# Patient Record
Sex: Male | Born: 1940 | Race: White | Hispanic: No | Marital: Married | Smoking: Former smoker
Health system: Southern US, Community
[De-identification: ages and names within clinical notes are randomized; demographics above are authoritative.]

## PROBLEM LIST (undated history)

## (undated) DIAGNOSIS — E785 Hyperlipidemia, unspecified: Secondary | ICD-10-CM

## (undated) DIAGNOSIS — I10 Essential (primary) hypertension: Secondary | ICD-10-CM

## (undated) DIAGNOSIS — M4800 Spinal stenosis, site unspecified: Secondary | ICD-10-CM

## (undated) DIAGNOSIS — G5600 Carpal tunnel syndrome, unspecified upper limb: Secondary | ICD-10-CM

## (undated) DIAGNOSIS — C61 Malignant neoplasm of prostate: Secondary | ICD-10-CM

## (undated) HISTORY — DX: Spinal stenosis, site unspecified: M48.00

## (undated) HISTORY — DX: Carpal tunnel syndrome, unspecified upper limb: G56.00

## (undated) HISTORY — PX: KNEE SURGERY: SHX244

## (undated) HISTORY — PX: INGUINAL HERNIA REPAIR: SUR1180

## (undated) HISTORY — DX: Essential (primary) hypertension: I10

## (undated) HISTORY — DX: Hyperlipidemia, unspecified: E78.5

## (undated) HISTORY — PX: HERNIA REPAIR: SHX51

## (undated) HISTORY — PX: PROSTATE BIOPSY: SHX241

---

## 2000-09-28 ENCOUNTER — Ambulatory Visit (HOSPITAL_COMMUNITY): Admission: RE | Admit: 2000-09-28 | Discharge: 2000-09-28 | Payer: Self-pay | Admitting: Surgery

## 2001-06-17 ENCOUNTER — Encounter: Admission: RE | Admit: 2001-06-17 | Discharge: 2001-06-17 | Payer: Self-pay | Admitting: Internal Medicine

## 2001-06-17 ENCOUNTER — Encounter: Payer: Self-pay | Admitting: Internal Medicine

## 2004-02-19 ENCOUNTER — Ambulatory Visit (HOSPITAL_COMMUNITY): Admission: RE | Admit: 2004-02-19 | Discharge: 2004-02-19 | Payer: Self-pay | Admitting: *Deleted

## 2006-04-10 ENCOUNTER — Encounter: Admission: RE | Admit: 2006-04-10 | Discharge: 2006-04-10 | Payer: Self-pay | Admitting: Internal Medicine

## 2011-01-20 ENCOUNTER — Other Ambulatory Visit: Payer: Self-pay | Admitting: Gastroenterology

## 2011-06-10 ENCOUNTER — Encounter: Payer: Self-pay | Admitting: Podiatry

## 2011-06-10 DIAGNOSIS — B351 Tinea unguium: Secondary | ICD-10-CM | POA: Insufficient documentation

## 2011-06-10 DIAGNOSIS — E119 Type 2 diabetes mellitus without complications: Secondary | ICD-10-CM | POA: Insufficient documentation

## 2011-06-10 DIAGNOSIS — E785 Hyperlipidemia, unspecified: Secondary | ICD-10-CM

## 2011-06-10 DIAGNOSIS — IMO0002 Reserved for concepts with insufficient information to code with codable children: Secondary | ICD-10-CM | POA: Insufficient documentation

## 2011-06-10 DIAGNOSIS — I10 Essential (primary) hypertension: Secondary | ICD-10-CM | POA: Insufficient documentation

## 2018-04-19 ENCOUNTER — Encounter: Payer: Self-pay | Admitting: Radiation Oncology

## 2018-05-03 ENCOUNTER — Encounter: Payer: Self-pay | Admitting: Radiation Oncology

## 2018-05-03 NOTE — Progress Notes (Signed)
GU Location of Tumor / Histology: prostatic adenocarcinoma  If Prostate Cancer, Gleason Score is (3 + 3) and PSA is (5.16). Prostate volume: 32.76 grams.  Scott Wang has been followed by Dr. Junious Silk since 2010 for an elevated PSA.   2010  PSA  2.03 2012  PSA  2.49 09/2015 PSA  3.6 03/2016 PSA  4 05/2016  PSA  4 08/2016 PSA  3.6 02/2017  PSA  4.56 03/29/2018 PSA  5.162   Biopsies of prostate (if applicable) revealed:    Past/Anticipated interventions by urology, if any: prostate biopsy, referral to radiation oncology for consideration of seeds  Past/Anticipated interventions by medical oncology, if any: no  Weight changes, if any: no  Bowel/Bladder complaints, if any: SHIM 9. IPSS 10. Reports ED. Denies dysuria, hematuria, urinary leakage or incontinence.  Nausea/Vomiting, if any: no  Pain issues, if any:  no  SAFETY ISSUES:  Prior radiation? no  Pacemaker/ICD? no  Possible current pregnancy? no  Is the patient on methotrexate? no  Current Complaints / other details:  77 year old male. Married with 2 sons. 3 grandchildren. Enjoys working outside. Retired. Most interested in brachytherapy after the summer.

## 2018-05-05 ENCOUNTER — Ambulatory Visit
Admission: RE | Admit: 2018-05-05 | Discharge: 2018-05-05 | Disposition: A | Discharge status: Home / Self Care | Payer: Medicare Other | Source: Ambulatory Visit | Attending: Radiation Oncology | Admitting: Radiation Oncology

## 2018-05-05 ENCOUNTER — Encounter: Payer: Self-pay | Admitting: Radiation Oncology

## 2018-05-05 ENCOUNTER — Other Ambulatory Visit: Payer: Self-pay

## 2018-05-05 VITALS — BP 144/73 | HR 65 | Temp 98.0°F | Resp 18 | Ht 67.0 in | Wt 173.4 lb

## 2018-05-05 DIAGNOSIS — E785 Hyperlipidemia, unspecified: Secondary | ICD-10-CM | POA: Insufficient documentation

## 2018-05-05 DIAGNOSIS — E119 Type 2 diabetes mellitus without complications: Secondary | ICD-10-CM | POA: Diagnosis not present

## 2018-05-05 DIAGNOSIS — C61 Malignant neoplasm of prostate: Secondary | ICD-10-CM | POA: Diagnosis not present

## 2018-05-05 DIAGNOSIS — Z7982 Long term (current) use of aspirin: Secondary | ICD-10-CM | POA: Diagnosis not present

## 2018-05-05 DIAGNOSIS — Z882 Allergy status to sulfonamides status: Secondary | ICD-10-CM | POA: Insufficient documentation

## 2018-05-05 DIAGNOSIS — Z7984 Long term (current) use of oral hypoglycemic drugs: Secondary | ICD-10-CM | POA: Diagnosis not present

## 2018-05-05 DIAGNOSIS — Z79899 Other long term (current) drug therapy: Secondary | ICD-10-CM | POA: Diagnosis not present

## 2018-05-05 DIAGNOSIS — I10 Essential (primary) hypertension: Secondary | ICD-10-CM | POA: Diagnosis not present

## 2018-05-05 HISTORY — DX: Malignant neoplasm of prostate: C61

## 2018-05-05 NOTE — Progress Notes (Signed)
See progress note under physician encounter. 

## 2018-05-05 NOTE — Progress Notes (Signed)
Radiation Oncology         [phone]) [phone] ________________________________  Initial Outpatient Consultation  Name: [name] MRN: [id number]  Date: 05/05/2018  DOB: 1940-11-28  CC:[name], [name], MD  Scott Aloe, MD   REFERRING PHYSICIAN: [name], MD  DIAGNOSIS: 77 y.o. gentleman with Stage T1c adenocarcinoma of the prostate with Gleason Score of 3+3, and PSA of 5.16.    ICD-10-CM   1. Malignant neoplasm of prostate ([place]) C61     HISTORY OF PRESENT ILLNESS: Scott Wang is a 77 y.o. male with a diagnosis of prostate cancer. He is an established patient of Dr. Junious Silk and has been followed since 2010 for an elevated PSA. His PSA history is documented as: 2010  PSA 2.03 2012  PSA 2.49 09/2015 PSA 3.60 03/2016 PSA 4.00 05/2016 PSA 4.00 08/2016 PSA 3.60 02/2017 PSA 4.56 08/2017 PSA 4.49 02/15/18  PSA 5.16 The patient returned to Dr. Junious Silk on 02/25/18,  digital rectal examination was performed at that time revealing no nodularity.  PSA was 5.16 on 02/15/18, previously 4.49 08/2017. The patient proceeded to transrectal ultrasound with 12 biopsies of the prostate on 03/29/18.  The prostate volume measured 32.76 cc.  Out of 12 core biopsies, two were positive.  The maximum Gleason score was 3+3, and this was seen in left apex lateral and left mid. High grade prostatic intraepithelial neoplasia was noted in the left base, right base lateral, right mid lateral, and right apex lateral.    The patient reviewed the biopsy results with his urologist and he has kindly been referred today for discussion of potential radiation treatment options.   PREVIOUS RADIATION THERAPY: No  PAST MEDICAL HISTORY:  Past Medical History:  Diagnosis Date  . Diabetes mellitus   . Hyperlipidemia   . Hypertension   . Prostate cancer ([place])       PAST SURGICAL HISTORY: Past Surgical History:  Procedure Laterality Date  . HERNIA REPAIR    . HERNIA REPAIR    . INGUINAL HERNIA REPAIR    .  KNEE SURGERY    . KNEE SURGERY    . PROSTATE BIOPSY      FAMILY HISTORY:  Family History  Problem Relation Age of Onset  . Breast cancer Mother   . Heart attack Father   . Prostate cancer Neg Hx   . Pancreatic cancer Neg Hx   . Colon cancer Neg Hx     SOCIAL HISTORY:  Social History   Socioeconomic History  . Marital status: Married    Spouse name: Not on file  . Number of children: Not on file  . Years of education: Not on file  . Highest education level: Not on file  Occupational History  . Occupation: retired  Scientific laboratory technician  . Financial resource strain: Not on file  . Food insecurity:    Worry: Not on file    Inability: Not on file  . Transportation needs:    Medical: Not on file    Non-medical: Not on file  Tobacco Use  . Smoking status: Never Smoker  . Smokeless tobacco: Never Used  Substance and Sexual Activity  . Alcohol use: No  . Drug use: Never  . 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 Medications have been unsuccessful.  Lifestyle  . Physical activity:    Days per week: Not on file    Minutes per session: Not on file  . Stress: Not on file  Relationships  . Social  connections:    Talks on phone: Not on file    Gets together: Not on file    Attends religious service: Not on file    Active member of club or organization: Not on file    Attends meetings of clubs or organizations: Not on file    Relationship status: Not on file  . Intimate partner violence:    Fear of current or ex partner: Not on file    Emotionally abused: Not on file    Physically abused: Not on file    Forced sexual activity: Not on file  Other Topics Concern  . Not on file  Social History Narrative   Resides in [place] with wife. Has two grown sons and three grandchildren.     ALLERGIES: Sulfa antibiotics  MEDICATIONS:  Current Outpatient Medications  Medication Sig Dispense Refill  . aspirin EC 81 MG tablet Take 81 mg by mouth daily.    [name]  atorvastatin (LIPITOR) 10 MG tablet TAKE 1 TABLET BY MOUTH ONCE A DAY    . carvedilol (COREG) 3.125 MG tablet TAKE 1 TABLET BY MOUTH TWICE A DAY WITH FOOD    . Cholecalciferol (VITAMIN D3) 1000 units CAPS Take by mouth.    . DOCOSAHEXAENOIC ACID PO Take 1 g by mouth.    [name] LISINOPRIL PO Take by mouth.      . losartan (COZAAR) 100 MG tablet TAKE 1 TABLET EVERY DAY    . metFORMIN (GLUCOPHAGE) 500 MG tablet TAKE 1 TABLET TWICE A DAY    . Multiple Vitamins-Minerals (MULTIVITAMIN ADULTS PO) Take by mouth.    . Omega-3 Fatty Acids (FISH OIL) 1000 MG CAPS Take by mouth.    Glory Rosebush DELICA LANCETS FINE MISC TEST ONCE DAILY AND IF NEEDED AS DIRECTED BY PHYSICIAN    . sildenafil (REVATIO) 20 MG tablet Take 20 mg by mouth 3 (three) times daily.     No current facility-administered medications for this encounter.     REVIEW OF SYSTEMS:  On review of systems, the patient reports that he is doing well overall. He denies any chest pain, shortness of breath, cough, fevers, chills, night sweats, unintended weight changes. He denies any bowel disturbances, and denies abdominal pain, nausea or vomiting. He denies any new musculoskeletal or joint aches or pains. His IPSS was 10, indicating moderate urinary symptoms. He denies dysuria, hematuria, urinary leakage, or incontinence. He is able to complete sexual activity with about half of attempts. A complete review of systems is obtained and is otherwise negative.    PHYSICAL EXAM:  Wt Readings from Last 3 Encounters:  05/05/18 173 lb 6.4 oz (78.7 kg)   Temp Readings from Last 3 Encounters:  05/05/18 98 F (36.7 C) (Oral)   BP Readings from Last 3 Encounters:  05/05/18 (!) 144/73   Pulse Readings from Last 3 Encounters:  05/05/18 65   Pain Assessment Pain Score: 0-No pain/10  In general this is a well appearing caucasian gentleman in no acute distress. He is alert and oriented x4 and appropriate throughout the examination. HEENT reveals that the  patient is normocephalic, atraumatic. EOMs are intact. PERRLA. Skin is intact without any evidence of gross lesions. Cardiovascular exam reveals a regular rate and rhythm, no clicks rubs or murmurs are auscultated. Chest is clear to auscultation bilaterally. Lymphatic assessment is performed and does not reveal any adenopathy in the cervical, supraclavicular, axillary, or inguinal chains. Abdomen has active bowel sounds in all quadrants and is intact. The abdomen  is soft, non tender, non distended. Lower extremities are negative for pretibial pitting edema, deep calf tenderness, cyanosis or clubbing.   KPS = 100  100 - Normal; no complaints; no evidence of disease. 90   - Able to carry on normal activity; minor signs or symptoms of disease. 80   - Normal activity with effort; some signs or symptoms of disease. 33   - Cares for self; unable to carry on normal activity or to do active work. 60   - Requires occasional assistance, but is able to care for most of his personal needs. 50   - Requires considerable assistance and frequent medical care. 32   - Disabled; requires special care and assistance. 8   - Severely disabled; hospital admission is indicated although death not imminent. 10   - Very sick; hospital admission necessary; active supportive treatment necessary. 10   - Moribund; fatal processes progressing rapidly. 0     - Dead  Karnofsky DA, Abelmann WH, Craver LS and Burchenal JH [phone]) The use of the nitrogen mustards in the palliative treatment of carcinoma: with particular reference to bronchogenic carcinoma Cancer 1 634-56  LABORATORY DATA:  No results found for: WBC, HGB, HCT, MCV, PLT No results found for: NA, K, CL, CO2 No results found for: ALT, AST, GGT, ALKPHOS, BILITOT   RADIOGRAPHY: No results found.    IMPRESSION/PLAN: 1. 77 y.o. gentleman with Stage T1c adenocarcinoma of the prostate with Gleason Score of 3+3, and PSA of 5.16. We discussed the patient's workup and  outlined the nature of prostate cancer in this setting. The patient's T stage, Gleason's score, and PSA put him into the low risk group. Accordingly, he is eligible for a variety of potential treatment options including active surveillance, prostatectomy, brachytherapy or 5.5 weeks of external radiation.  We discussed the available radiation techniques, and focused on the details and logistics and delivery. We discussed and outlined the risks, benefits, short and long-term effects associated with radiotherapy and compared and contrasted these with prostatectomy. We also discussed the role of SpaceOAR in reducing the rectal toxicity associated with radiotherapy.  At the conclusion of our conversation, the patient is interested in moving forward with brachytherapy and use of SpaceOAR to reduce rectal toxicity from radiotherapy.  We will share our discussion with Dr. Junious Silk and move forward with scheduling his CT [facility] planning appointment in the near future.  The patient met briefly with Romie Jumper in our office who will be working closely with him to coordinate OR scheduling and pre and post procedure appointments.  We will contact the pharmaceutical rep to ensure that [place] is available at the time of procedure.  He will have a prostate MRI following his post-seed CT SIM to confirm appropriate distribution of the [place].  We enjoyed meeting with him and his wife today, and will look forward to participating in the care of this very nice gentleman.  We spent 60 minutes minutes face to face with the patient and more than 50% of that time was spent in counseling and/or coordination of care.    Nicholos Johns, PA-C    Tyler Pita, MD  [facility] Direct Dial: [phone]  Fax: [phone] [email or web address]  Skype  [email or web address]    This document serves as a record of services personally performed by Tyler Pita, MD and Freeman Caldron, PA-C. It was created on their  behalf by Bethann Humble, a trained medical scribe. The creation of this record is based on  the scribe's personal observations and the provider's statements to them. This document has been checked and approved by the attending provider.

## 2018-05-10 ENCOUNTER — Telehealth: Payer: Self-pay | Admitting: *Deleted

## 2018-05-10 NOTE — Telephone Encounter (Signed)
Called patient to inform of pre-seed appts. On 07-08-18, lvm for a return call

## 2018-05-14 ENCOUNTER — Ambulatory Visit: Payer: Medicare Other | Admitting: Radiation Oncology

## 2018-05-20 ENCOUNTER — Telehealth: Payer: Self-pay | Admitting: Medical Oncology

## 2018-05-20 NOTE — Telephone Encounter (Signed)
Left message with Scott Wang to introduce myself as the prostate nurse navigator and my role. I was unable to meet him the day he consulted with Dr. Tammi Klippel. I informed him that [name] had tried to contact him with appointment for pre-seed CT appointment 8/29 at 1:00pm. I asked him to call back to confirm.

## 2018-06-16 ENCOUNTER — Other Ambulatory Visit: Payer: Self-pay | Admitting: Urology

## 2018-07-07 ENCOUNTER — Telehealth: Payer: Self-pay | Admitting: *Deleted

## 2018-07-07 NOTE — Telephone Encounter (Signed)
Called patient to remind of pre-seed appts. for 07-08-18, lvm for a return call

## 2018-07-08 ENCOUNTER — Ambulatory Visit: Payer: Medicare Other | Admitting: Radiation Oncology

## 2018-07-08 ENCOUNTER — Ambulatory Visit
Admission: RE | Admit: 2018-07-08 | Discharge: 2018-07-08 | Disposition: A | Discharge status: Home / Self Care | Payer: Medicare Other | Source: Ambulatory Visit | Attending: Radiation Oncology | Admitting: Radiation Oncology

## 2018-07-08 ENCOUNTER — Encounter: Payer: Self-pay | Admitting: Medical Oncology

## 2018-07-08 DIAGNOSIS — C61 Malignant neoplasm of prostate: Secondary | ICD-10-CM

## 2018-07-08 NOTE — Progress Notes (Signed)
  Radiation Oncology         [phone] ________________________________  Name: CHRISTOPHE RISING MRN: [id number]  Date: 07/08/2018  DOB: [birth year]  SIMULATION AND TREATMENT PLANNING NOTE PUBIC ARCH STUDY  CC:[name], [name], MD  [name], MD  DIAGNOSIS: 77 y.o. gentleman with Stage T1c adenocarcinoma of the prostate with Gleason Score of 3+3, and PSA of 5.16.     ICD-10-CM   1. Malignant neoplasm of prostate ([place]) C61     COMPLEX SIMULATION:  The patient presented today for evaluation for possible prostate seed implant. He was brought to the radiation planning suite and placed supine on the CT couch. A 3-dimensional image study set was obtained in upload to the planning computer. There, on each axial slice, I contoured the prostate gland. Then, using three-dimensional radiation planning tools I reconstructed the prostate in view of the structures from the transperineal needle pathway to assess for possible pubic arch interference. In doing so, I did not appreciate any pubic arch interference. Also, the patient's prostate volume was estimated based on the drawn structure. The volume was 33 cc.  Given the pubic arch appearance and prostate volume, patient remains a good candidate to proceed with prostate seed implant. Today, he freely provided informed written consent to proceed.    PLAN: The patient will undergo prostate seed implant.   ________________________________  Sheral Apley. Tammi Klippel, M.D.

## 2018-07-09 ENCOUNTER — Ambulatory Visit (HOSPITAL_COMMUNITY)
Admission: RE | Admit: 2018-07-09 | Discharge: 2018-07-09 | Disposition: A | Discharge status: Home / Self Care | Payer: Medicare Other | Source: Ambulatory Visit | Attending: Urology | Admitting: Urology

## 2018-07-09 ENCOUNTER — Encounter (HOSPITAL_COMMUNITY)
Admission: RE | Admit: 2018-07-09 | Discharge: 2018-07-09 | Disposition: A | Discharge status: Home / Self Care | Payer: Medicare Other | Source: Ambulatory Visit | Attending: Urology | Admitting: Urology

## 2018-07-09 DIAGNOSIS — C61 Malignant neoplasm of prostate: Secondary | ICD-10-CM

## 2018-07-09 DIAGNOSIS — Z01810 Encounter for preprocedural cardiovascular examination: Secondary | ICD-10-CM | POA: Insufficient documentation

## 2018-07-09 DIAGNOSIS — J984 Other disorders of lung: Secondary | ICD-10-CM | POA: Diagnosis not present

## 2018-07-09 DIAGNOSIS — Z01818 Encounter for other preprocedural examination: Secondary | ICD-10-CM | POA: Diagnosis present

## 2018-08-17 ENCOUNTER — Other Ambulatory Visit: Payer: Self-pay | Admitting: Urology

## 2018-08-17 DIAGNOSIS — C61 Malignant neoplasm of prostate: Secondary | ICD-10-CM

## 2018-08-30 ENCOUNTER — Telehealth: Payer: Self-pay | Admitting: *Deleted

## 2018-08-30 NOTE — Telephone Encounter (Signed)
CALLED PATIENT TO REMIND OF LABS FOR 08-31-18 - ARRIVAL TIME - 10:45 AM @ [facility] ADMITTING, LVM FOR A RETURN CALL

## 2018-08-31 ENCOUNTER — Encounter (HOSPITAL_COMMUNITY)
Admission: RE | Admit: 2018-08-31 | Discharge: 2018-08-31 | Disposition: A | Discharge status: Home / Self Care | Payer: Medicare Other | Source: Ambulatory Visit | Attending: Urology | Admitting: Urology

## 2018-08-31 DIAGNOSIS — Z01812 Encounter for preprocedural laboratory examination: Secondary | ICD-10-CM | POA: Insufficient documentation

## 2018-08-31 LAB — COMPREHENSIVE METABOLIC PANEL
ALK PHOS: 30 U/L — AB (ref 38–126)
ALT: 23 U/L (ref 0–44)
AST: 21 U/L (ref 15–41)
Albumin: 4.1 g/dL (ref 3.5–5.0)
Anion gap: 8 (ref 5–15)
BUN: 23 mg/dL (ref 8–23)
CALCIUM: 10.2 mg/dL (ref 8.9–10.3)
CHLORIDE: 106 mmol/L (ref 98–111)
CO2: 23 mmol/L (ref 22–32)
CREATININE: 1.24 mg/dL (ref 0.61–1.24)
GFR calc Af Amer: >60 mL/min (ref >60)
GFR, EST NON AFRICAN AMERICAN: 55 mL/min — AB (ref >60)
Glucose, Bld: 107 mg/dL — ABNORMAL HIGH (ref 70–99)
Potassium: 4.2 mmol/L (ref 3.5–5.1)
Sodium: 137 mmol/L (ref 135–145)
Total Bilirubin: 1.2 mg/dL (ref 0.3–1.2)
Total Protein: 7.3 g/dL (ref 6.5–8.1)

## 2018-08-31 LAB — CBC
HCT: 39.9 % (ref 39.0–52.0)
HEMOGLOBIN: 13 g/dL (ref 13.0–17.0)
MCH: 30.5 pg (ref 26.0–34.0)
MCHC: 32.6 g/dL (ref 30.0–36.0)
MCV: 93.7 fL (ref 80.0–100.0)
NRBC: 0 % (ref 0.0–0.2)
PLATELETS: 205 10*3/uL (ref 150–400)
RBC: 4.26 MIL/uL (ref 4.22–5.81)
RDW: 13.2 % (ref 11.5–15.5)
WBC: 7.2 10*3/uL (ref 4.0–10.5)

## 2018-08-31 LAB — PROTIME-INR
INR: 0.87
PROTHROMBIN TIME: 11.8 s (ref 11.4–15.2)

## 2018-08-31 LAB — APTT: aPTT: 27 seconds (ref 24–36)

## 2018-09-02 ENCOUNTER — Encounter (HOSPITAL_BASED_OUTPATIENT_CLINIC_OR_DEPARTMENT_OTHER): Payer: Self-pay | Admitting: *Deleted

## 2018-09-02 ENCOUNTER — Other Ambulatory Visit: Payer: Self-pay

## 2018-09-02 NOTE — Progress Notes (Signed)
Spoke with patient via telephone for pre op interview. NPO after MN. Patient to take coreg AM of surgery with a sip of water. EKG and Labs in chart and Epic. Arrival time 71. Patient to do fleets enema AM of surgery.

## 2018-09-06 ENCOUNTER — Telehealth: Payer: Self-pay | Admitting: *Deleted

## 2018-09-06 NOTE — Telephone Encounter (Signed)
CALLED PATIENT TO REMIND OF PROCEDURE FOR 09-07-18, LVM FOR A RETURN CALL

## 2018-09-06 NOTE — H&P (Signed)
Office Visit Report     08/18/2018   --------------------------------------------------------------------------------   Scott Wang  MRN: [id number]  PRIMARY CARE:  [facility]), MD  DOB: 10-29-1941, 77 year old Male  REFERRING:  [facility]), MD  SSN: -[phone]  PROVIDER:  [name], M.D.    TREATING:  Jiles Crocker    LOCATION:  [facility] [phone]   --------------------------------------------------------------------------------   CC: I have prostate cancer.  HPI: Scott Wang is a 77 year-old male established patient who is here evaluation for treatment of prostate cancer.  May 2019: low risk prostate cancer  PSA 5.16  T1c  Prostate 33 g  Gleason 3+3 = 6, left mid, 20%  Gleason 3+3 = 6, left apex, 20%  Atypia left apex 1  HGPIN x 4   AUASS = 6. SHIM given, not done. He uses sildenafil 100 mg.   He returns and PSA remains stable at 4.9. He is planning for brachytherapy Oct 2019. No gross hematuria or dysuria.    08/18/18: Here today for pre-operative appointment prior to undergoing brachytherapy seed placement with SpaceOAR on 10/29. He denies any changes in voiding habits, recent burning or painful urination, gross hematuria. He has not had any changes in medical history or medications. No recent fevers or other constitutional signs or symptoms of infection.   Symptom score sheet remains 6. He is not on any medication to assist with lower urinary tract symptoms.   His prostate cancer was diagnosed 03/29/2018. His PSA at his time of diagnosis was 5.16.     ALLERGIES: Sulfa Drugs    MEDICATIONS: Levaquin 750 mg tablet take 1 tablet 1 hour prior to procedure  Aspirin 81 MG TABS Oral  Atorvastatin Calcium 10 mg tablet 1 tablet PO Daily  Carvedilol 3.125 mg tablet 1 tablet PO Daily  Fish Oil CAPS Oral  Losartan Potassium 50 mg tablet Oral  Magnesium TABS Oral  Metformin Hcl 500 mg tablet Oral   Multivitamin 1 PO Daily  Omega-3 Krill Oil 1 PO Daily  Sildenafil 20 mg tablet 1-5 tablets po as needed  Vitamin D3 CAPS Oral     GU PSH: Prostate Needle Biopsy - 03/29/2018    NON-GU PSH: Inguinal hernia repair (laparoscopic) Knee Arthroscopy; Dx - 2010, 2010 Surgical Pathology, Gross And Microscopic Examination For Prostate Needle - 03/29/2018    GU PMH: ED due to arterial insufficiency - 06/30/2018, - 02/25/2018, - 08/24/2017, - 02/19/2017, - 09/04/2016, - 2017, - 2017 Prostate Cancer - 06/30/2018, - 04/15/2018 Elevated PSA - 03/29/2018, - 02/25/2018, - 08/24/2017, - 02/19/2017, - 09/04/2016, - 2017 Bladder-neck stenosis/contracture, Bladder neck contracture - 2014 BPH w/o LUTS, Benign prostatic hypertrophy without lower urinary tract symptoms - 2014 Inflammatory Disease Prostate, Unspec, Prostatitis - 2014      PMH Notes:  2008-12-13 10:05:20 - Note: Glucose Intolerance   NON-GU PMH: Diabetes Type 2 Encounter for general adult medical examination without abnormal findings, Encounter for preventive health examination Hypercholesterolemia Hypertension    FAMILY HISTORY: Acute Myocardial Infarction - Father Death In The Family Father - Father Family Health Status - Mother's Age - Mother Family Health Status Number - Runs In Family   SOCIAL HISTORY: Marital Status: Married Preferred Language: English; Ethnicity: Not Hispanic Or Latino; Race: White Patient's occupation is/was retired.     Notes: 2 sons   REVIEW OF SYSTEMS:    GU Review Male:   Patient reports erection problems. Patient denies frequent urination, hard to  postpone urination, burning/ pain with urination, get up at night to urinate, leakage of urine, stream starts and stops, trouble starting your stream, have to strain to urinate , and penile pain.  Gastrointestinal (Upper):   Patient denies nausea, vomiting, and indigestion/ heartburn.  Gastrointestinal (Lower):   Patient denies diarrhea and constipation.   Constitutional:   Patient denies fever, night sweats, weight loss, and fatigue.  Skin:   Patient denies skin rash/ lesion and itching.  Eyes:   Patient denies blurred vision and double vision.  Ears/ Nose/ Throat:   Patient denies sore throat and sinus problems.  Hematologic/Lymphatic:   Patient denies swollen glands and easy bruising.  Cardiovascular:   Patient denies leg swelling and chest pains.  Respiratory:   Patient denies cough and shortness of breath.  Endocrine:   Patient denies excessive thirst.  Musculoskeletal:   Patient denies back pain and joint pain.  Neurological:   Patient denies headaches and dizziness.  Psychologic:   Patient denies depression and anxiety.   Notes: Reviewed previous review of systems 02/25/2018. No changes.   VITAL SIGNS:      08/18/2018 09:26 AM  Weight 172 lb / 78.02 kg  Height 68 in / 172.72 cm  BP 168/85 mmHg  Pulse 67 /min  Temperature 97.9 F / 36.6 C  BMI 26.1 kg/m   MULTI-SYSTEM PHYSICAL EXAMINATION:    Constitutional: Well-nourished. No physical deformities. Normally developed. Good grooming.  Neck: Neck symmetrical, not swollen. Normal tracheal position.  Respiratory: No labored breathing, no use of accessory muscles.   Cardiovascular: Normal temperature, normal extremity pulses, no swelling, no varicosities.  Skin: No paleness, no jaundice, no cyanosis. No lesion, no ulcer, no rash.  Neurologic / Psychiatric: Oriented to time, oriented to place, oriented to person. No depression, no anxiety, no agitation.  Gastrointestinal: No mass, no tenderness, no rigidity, non obese abdomen.  Musculoskeletal: Normal gait and station of head and neck.     PAST DATA REVIEWED:  Source Of History:  Patient  Lab Test Review:   PSA  Records Review:   Pathology Reports, Previous Patient Records  Urine Test Review:   Urinalysis   06/24/18 02/15/18 08/20/17 05/28/17 02/13/17 08/27/16  PSA  Total PSA 4.90 ng/mL 5.16 ng/mL 4.49 ng/mL 4.46 ng/mL 4.56  ng/dl 3.63 ng/dl  Free PSA  0.73 ng/mL 0.53 ng/mL 0.53 ng/mL 0.46 ng/dl   % Free PSA  14 % PSA 12 % PSA 12 % PSA 10 %     08/18/18  Urinalysis  Urine Appearance Clear   Urine Color Yellow   Urine Glucose Neg mg/dL  Urine Bilirubin Neg mg/dL  Urine Ketones Neg mg/dL  Urine Specific Gravity 1.025   Urine Blood Neg ery/uL  Urine pH 6.0   Urine Protein Neg mg/dL  Urine Urobilinogen 0.2 mg/dL  Urine Nitrites Neg   Urine Leukocyte Esterase Neg leu/uL   PROCEDURES:          Urinalysis Dipstick Dipstick Cont'd  Color: Yellow Bilirubin: Neg mg/dL  Appearance: Clear Ketones: Neg mg/dL  Specific Gravity: 1.025 Blood: Neg ery/uL  pH: 6.0 Protein: Neg mg/dL  Glucose: Neg mg/dL Urobilinogen: 0.2 mg/dL    Nitrites: Neg    Leukocyte Esterase: Neg leu/uL    ASSESSMENT:      ICD-10 Details  1 GU:   Prostate Cancer - C61    PLAN:           Orders Labs Urine Culture          Schedule  Return Visit/Planned Activity: Keep Scheduled Appointment - Schedule Surgery          Document Letter(s):  Created for Patient: Clinical Summary         Notes:   I discussed expected postprocedural course in terms of lower urinary tract symptoms, bowel management, activity limitations, scheduled follow-up etc. I answered all questions to the best of my ability with understanding expressed by the patient. A preprocedural urine culture was sent today. He will keep scheduled appointment for brachytherapy seed placement with SpaceOAR on 10/29.        Next Appointment:      Next Appointment: 09/07/2018 08:30 AM    Appointment Type: Surgery     Location: [facility] [phone]    Provider: [name], M.D.    Reason for Visit: NE/OP RAD SEED AND SPACE OAR      * Signed by Jiles Crocker on 08/18/18 at 10:20 AM (EDT)*     The information contained in this medical record document is considered private and confidential patient information. This information can only be used for  the medical diagnosis and/or medical services that are being provided by the patient's selected caregivers. This information can only be distributed outside of the patient's care if the patient agrees and signs waivers of authorization for this information to be sent to an outside source or route.

## 2018-09-07 ENCOUNTER — Encounter (HOSPITAL_BASED_OUTPATIENT_CLINIC_OR_DEPARTMENT_OTHER)
Admission: RE | Disposition: A | Discharge status: Home / Self Care | Payer: Self-pay | Source: Ambulatory Visit | Attending: Urology

## 2018-09-07 ENCOUNTER — Ambulatory Visit (HOSPITAL_COMMUNITY): Payer: Medicare Other

## 2018-09-07 ENCOUNTER — Ambulatory Visit (HOSPITAL_BASED_OUTPATIENT_CLINIC_OR_DEPARTMENT_OTHER): Payer: Medicare Other | Admitting: Certified Registered"

## 2018-09-07 ENCOUNTER — Ambulatory Visit (HOSPITAL_BASED_OUTPATIENT_CLINIC_OR_DEPARTMENT_OTHER)
Admission: RE | Admit: 2018-09-07 | Discharge: 2018-09-07 | Disposition: A | Discharge status: Home / Self Care | Payer: Medicare Other | Source: Ambulatory Visit | Attending: Urology | Admitting: Urology

## 2018-09-07 ENCOUNTER — Encounter (HOSPITAL_BASED_OUTPATIENT_CLINIC_OR_DEPARTMENT_OTHER): Payer: Self-pay

## 2018-09-07 ENCOUNTER — Other Ambulatory Visit: Payer: Self-pay

## 2018-09-07 DIAGNOSIS — E119 Type 2 diabetes mellitus without complications: Secondary | ICD-10-CM | POA: Insufficient documentation

## 2018-09-07 DIAGNOSIS — Z7984 Long term (current) use of oral hypoglycemic drugs: Secondary | ICD-10-CM | POA: Insufficient documentation

## 2018-09-07 DIAGNOSIS — C61 Malignant neoplasm of prostate: Secondary | ICD-10-CM | POA: Insufficient documentation

## 2018-09-07 DIAGNOSIS — Z7982 Long term (current) use of aspirin: Secondary | ICD-10-CM | POA: Insufficient documentation

## 2018-09-07 DIAGNOSIS — Z8249 Family history of ischemic heart disease and other diseases of the circulatory system: Secondary | ICD-10-CM | POA: Diagnosis not present

## 2018-09-07 DIAGNOSIS — E7800 Pure hypercholesterolemia, unspecified: Secondary | ICD-10-CM | POA: Insufficient documentation

## 2018-09-07 DIAGNOSIS — I10 Essential (primary) hypertension: Secondary | ICD-10-CM | POA: Diagnosis not present

## 2018-09-07 DIAGNOSIS — Z882 Allergy status to sulfonamides status: Secondary | ICD-10-CM | POA: Diagnosis not present

## 2018-09-07 DIAGNOSIS — N35919 Unspecified urethral stricture, male, unspecified site: Secondary | ICD-10-CM | POA: Insufficient documentation

## 2018-09-07 DIAGNOSIS — Z79899 Other long term (current) drug therapy: Secondary | ICD-10-CM | POA: Insufficient documentation

## 2018-09-07 HISTORY — PX: RADIOACTIVE SEED IMPLANT: SHX5150

## 2018-09-07 HISTORY — PX: SPACE OAR INSTILLATION: SHX6769

## 2018-09-07 LAB — GLUCOSE, CAPILLARY
GLUCOSE-CAPILLARY: 111 mg/dL — AB (ref 70–99)
Glucose-Capillary: 113 mg/dL — ABNORMAL HIGH (ref 70–99)

## 2018-09-07 SURGERY — INSERTION, RADIATION SOURCE, PROSTATE
Anesthesia: General

## 2018-09-07 MED ORDER — SODIUM CHLORIDE 0.9 % IV SOLN
INTRAVENOUS | Status: DC | PRN
  Administered 2018-09-07: 15 ug/min via INTRAVENOUS

## 2018-09-07 MED ORDER — OXYCODONE HCL 5 MG/5ML PO SOLN
5.0000 mg | Freq: Once | ORAL | Status: DC | PRN
  Filled 2018-09-07: qty 5

## 2018-09-07 MED ORDER — CEPHALEXIN 500 MG PO CAPS: 500.0000 mg | ORAL_CAPSULE | Freq: Two times a day (BID) | ORAL | 0 refills | Status: AC

## 2018-09-07 MED ORDER — FENTANYL CITRATE (PF) 250 MCG/5ML IJ SOLN: INTRAMUSCULAR | Status: DC | PRN

## 2018-09-07 MED ORDER — LIDOCAINE 2% (20 MG/ML) 5 ML SYRINGE
INTRAMUSCULAR | Status: AC
  Filled 2018-09-07: qty 5

## 2018-09-07 MED ORDER — SODIUM CHLORIDE 0.9 % IJ SOLN
INTRAMUSCULAR | Status: DC | PRN
  Administered 2018-09-07: 10 mL

## 2018-09-07 MED ORDER — FENTANYL CITRATE (PF) 100 MCG/2ML IJ SOLN
INTRAMUSCULAR | Status: AC
  Filled 2018-09-07: qty 2

## 2018-09-07 MED ORDER — ALFUZOSIN HCL ER 10 MG PO TB24: 10.0000 mg | ORAL_TABLET | Freq: Every day | ORAL | 1 refills | Status: DC

## 2018-09-07 MED ORDER — EPHEDRINE 5 MG/ML INJ
INTRAVENOUS | Status: AC
  Filled 2018-09-07: qty 10

## 2018-09-07 MED ORDER — CIPROFLOXACIN IN D5W 400 MG/200ML IV SOLN
400.0000 mg | INTRAVENOUS | Status: AC
  Administered 2018-09-07: 400 mg via INTRAVENOUS
  Filled 2018-09-07: qty 200

## 2018-09-07 MED ORDER — FENTANYL CITRATE (PF) 100 MCG/2ML IJ SOLN
25.0000 ug | INTRAMUSCULAR | Status: DC | PRN
  Filled 2018-09-07: qty 1

## 2018-09-07 MED ORDER — DEXAMETHASONE SODIUM PHOSPHATE 10 MG/ML IJ SOLN
INTRAMUSCULAR | Status: DC | PRN
  Administered 2018-09-07: 4 mg via INTRAVENOUS

## 2018-09-07 MED ORDER — FLEET ENEMA 7-19 GM/118ML RE ENEM
1.0000 | ENEMA | Freq: Once | RECTAL | Status: DC
  Filled 2018-09-07: qty 1

## 2018-09-07 MED ORDER — PHENYLEPHRINE HCL 10 MG/ML IJ SOLN
INTRAMUSCULAR | Status: AC
  Filled 2018-09-07: qty 1

## 2018-09-07 MED ORDER — DEXAMETHASONE SODIUM PHOSPHATE 10 MG/ML IJ SOLN
INTRAMUSCULAR | Status: AC
  Filled 2018-09-07: qty 1

## 2018-09-07 MED ORDER — EPHEDRINE SULFATE-NACL 50-0.9 MG/10ML-% IV SOSY
PREFILLED_SYRINGE | INTRAVENOUS | Status: DC | PRN
  Administered 2018-09-07: 10 mg via INTRAVENOUS

## 2018-09-07 MED ORDER — ONDANSETRON HCL 4 MG/2ML IJ SOLN
INTRAMUSCULAR | Status: AC
  Filled 2018-09-07: qty 2

## 2018-09-07 MED ORDER — LACTATED RINGERS IV SOLN
INTRAVENOUS | Status: DC
  Administered 2018-09-07: 12:00:00 via INTRAVENOUS
  Administered 2018-09-07: 1000 mL via INTRAVENOUS
  Filled 2018-09-07: qty 1000

## 2018-09-07 MED ORDER — CIPROFLOXACIN IN D5W 400 MG/200ML IV SOLN
INTRAVENOUS | Status: AC
  Filled 2018-09-07: qty 200

## 2018-09-07 MED ORDER — KETOROLAC TROMETHAMINE 30 MG/ML IJ SOLN
INTRAMUSCULAR | Status: DC | PRN
  Administered 2018-09-07: 30 mg via INTRAVENOUS

## 2018-09-07 MED ORDER — LIDOCAINE 2% (20 MG/ML) 5 ML SYRINGE
INTRAMUSCULAR | Status: DC | PRN
  Administered 2018-09-07: 60 mg via INTRAVENOUS

## 2018-09-07 MED ORDER — PROPOFOL 10 MG/ML IV BOLUS
INTRAVENOUS | Status: DC | PRN
  Administered 2018-09-07: 120 mg via INTRAVENOUS

## 2018-09-07 MED ORDER — OXYCODONE HCL 5 MG PO TABS
5.0000 mg | ORAL_TABLET | Freq: Once | ORAL | Status: DC | PRN
  Filled 2018-09-07: qty 1

## 2018-09-07 MED ORDER — PROPOFOL 10 MG/ML IV BOLUS
INTRAVENOUS | Status: AC
  Filled 2018-09-07: qty 20

## 2018-09-07 MED ORDER — SODIUM CHLORIDE 0.9 % IV SOLN
INTRAVENOUS | Status: AC | PRN
  Administered 2018-09-07: 1000 mL

## 2018-09-07 MED ORDER — FENTANYL CITRATE (PF) 100 MCG/2ML IJ SOLN
INTRAMUSCULAR | Status: DC | PRN
  Administered 2018-09-07 (×4): 50 ug via INTRAVENOUS

## 2018-09-07 MED ORDER — IOHEXOL 300 MG/ML  SOLN
INTRAMUSCULAR | Status: DC | PRN
  Administered 2018-09-07: 7 mL

## 2018-09-07 MED ORDER — ONDANSETRON HCL 4 MG/2ML IJ SOLN
INTRAMUSCULAR | Status: DC | PRN
  Administered 2018-09-07: 4 mg via INTRAVENOUS

## 2018-09-07 MED ORDER — ONDANSETRON HCL 4 MG/2ML IJ SOLN
4.0000 mg | Freq: Once | INTRAMUSCULAR | Status: DC | PRN
  Filled 2018-09-07: qty 2

## 2018-09-07 SURGICAL SUPPLY — 49 items
BAG URINE DRAINAGE (UROLOGICAL SUPPLIES) ×3 IMPLANT
BLADE CLIPPER SURG (BLADE) ×3 IMPLANT
CATH FOLEY 2W COUNCIL 5CC 18FR (CATHETERS) ×3 IMPLANT
CATH FOLEY 2WAY SLVR  5CC 16FR (CATHETERS) ×2
CATH FOLEY 2WAY SLVR 5CC 16FR (CATHETERS) ×1 IMPLANT
CATH ROBINSON RED A/P 16FR (CATHETERS) IMPLANT
CATH ROBINSON RED A/P 20FR (CATHETERS) ×3 IMPLANT
CLOTH BEACON ORANGE TIMEOUT ST (SAFETY) ×3 IMPLANT
CONT SPECI 4OZ STER CLIK (MISCELLANEOUS) ×6 IMPLANT
COVER BACK TABLE 60X90IN (DRAPES) ×3 IMPLANT
COVER MAYO STAND STRL (DRAPES) ×3 IMPLANT
DRSG TEGADERM 4X4.75 (GAUZE/BANDAGES/DRESSINGS) ×3 IMPLANT
DRSG TEGADERM 8X12 (GAUZE/BANDAGES/DRESSINGS) ×6 IMPLANT
GAUZE SPONGE 4X4 12PLY STRL (GAUZE/BANDAGES/DRESSINGS) ×3 IMPLANT
GLOVE BIO SURGEON STRL SZ 6 (GLOVE) IMPLANT
GLOVE BIO SURGEON STRL SZ 6.5 (GLOVE) IMPLANT
GLOVE BIO SURGEON STRL SZ7 (GLOVE) ×3 IMPLANT
GLOVE BIO SURGEON STRL SZ7.5 (GLOVE) ×6 IMPLANT
GLOVE BIO SURGEON STRL SZ8 (GLOVE) IMPLANT
GLOVE BIO SURGEONS STRL SZ 6.5 (GLOVE)
GLOVE BIOGEL PI IND STRL 6 (GLOVE) IMPLANT
GLOVE BIOGEL PI IND STRL 6.5 (GLOVE) IMPLANT
GLOVE BIOGEL PI IND STRL 7.0 (GLOVE) ×1 IMPLANT
GLOVE BIOGEL PI IND STRL 8 (GLOVE) IMPLANT
GLOVE BIOGEL PI INDICATOR 6 (GLOVE)
GLOVE BIOGEL PI INDICATOR 6.5 (GLOVE)
GLOVE BIOGEL PI INDICATOR 7.0 (GLOVE) ×2
GLOVE BIOGEL PI INDICATOR 8 (GLOVE)
GLOVE ECLIPSE 8.0 STRL XLNG CF (GLOVE) ×3 IMPLANT
GOWN STRL REUS W/ TWL LRG LVL3 (GOWN DISPOSABLE) ×1 IMPLANT
GOWN STRL REUS W/ TWL XL LVL3 (GOWN DISPOSABLE) ×1 IMPLANT
GOWN STRL REUS W/TWL LRG LVL3 (GOWN DISPOSABLE) ×2
GOWN STRL REUS W/TWL XL LVL3 (GOWN DISPOSABLE) ×5 IMPLANT
HOLDER FOLEY CATH W/STRAP (MISCELLANEOUS) ×3 IMPLANT
I-SEED AGX100 ×3 IMPLANT
IMPL SPACEOAR SYSTEM 10ML (Spacer) ×1 IMPLANT
IMPLANT SPACEOAR SYSTEM 10ML (Spacer) ×3 IMPLANT
IV NS 1000ML (IV SOLUTION) ×2
IV NS 1000ML BAXH (IV SOLUTION) ×1 IMPLANT
KIT TURNOVER CYSTO (KITS) ×3 IMPLANT
MARKER SKIN DUAL TIP RULER LAB (MISCELLANEOUS) ×3 IMPLANT
PACK CYSTO (CUSTOM PROCEDURE TRAY) ×3 IMPLANT
SURGILUBE 2OZ TUBE FLIPTOP (MISCELLANEOUS) ×3 IMPLANT
SUT BONE WAX W31G (SUTURE) IMPLANT
SYR 10ML LL (SYRINGE) ×3 IMPLANT
TUBE CONNECTING 12'X1/4 (SUCTIONS)
TUBE CONNECTING 12X1/4 (SUCTIONS) IMPLANT
UNDERPAD 30X30 (UNDERPADS AND DIAPERS) ×6 IMPLANT
WATER STERILE IRR 500ML POUR (IV SOLUTION) ×3 IMPLANT

## 2018-09-07 NOTE — Anesthesia Postprocedure Evaluation (Signed)
Anesthesia Post Note  Patient: Scott Wang  Procedure(s) Performed: RADIOACTIVE SEED IMPLANT/BRACHYTHERAPY IMPLANT (N/A ) SPACE OAR INSTILLATION (N/A )     Patient location during evaluation: PACU Anesthesia Type: General Level of consciousness: awake and alert Pain management: pain level controlled Vital Signs Assessment: post-procedure vital signs reviewed and stable Respiratory status: spontaneous breathing, nonlabored ventilation and respiratory function stable Cardiovascular status: blood pressure returned to baseline and stable Postop Assessment: no apparent nausea or vomiting Anesthetic complications: no    Last Vitals:  Vitals:   09/07/18 1100 09/07/18 1115  BP: 125/67 120/69  Pulse: 65 64  Resp: 12 16  Temp: (!) 36.4 C 36.4 C  SpO2: 100% 99%    Last Pain:  Vitals:   09/07/18 1115  TempSrc:   PainSc: 0-No pain                 Lidia Collum

## 2018-09-07 NOTE — Anesthesia Preprocedure Evaluation (Addendum)
Anesthesia Evaluation  Patient identified by MRN, date of birth, ID band Patient awake    Reviewed: Allergy & Precautions, NPO status , Patient's Chart, lab work & pertinent test results, reviewed documented beta blocker date and time   History of Anesthesia Complications Negative for: history of anesthetic complications  Airway Mallampati: III  TM Distance: >3 FB Neck ROM: Full    Dental no notable dental hx.    Pulmonary neg pulmonary ROS, former smoker,    Pulmonary exam normal        Cardiovascular hypertension, Pt. on medications and Pt. on home beta blockers Normal cardiovascular exam     Neuro/Psych negative neurological ROS  negative psych ROS   GI/Hepatic negative GI ROS, Neg liver ROS,   Endo/Other  diabetes, Oral Hypoglycemic Agents  Renal/GU negative Renal ROS  negative genitourinary   Musculoskeletal negative musculoskeletal ROS (+)   Abdominal   Peds  Hematology negative hematology ROS (+)   Anesthesia Other Findings   Reproductive/Obstetrics                            Anesthesia Physical Anesthesia Plan  ASA: II  Anesthesia Plan: General   Post-op Pain Management:    Induction:   PONV Risk Score and Plan: 3 and Ondansetron, Dexamethasone and Treatment may vary due to age or medical condition  Airway Management Planned: LMA  Additional Equipment: None  Intra-op Plan:   Post-operative Plan: Extubation in OR  Informed Consent: I have reviewed the patients History and Physical, chart, labs and discussed the procedure including the risks, benefits and alternatives for the proposed anesthesia with the patient or authorized representative who has indicated his/her understanding and acceptance.     Plan Discussed with:   Anesthesia Plan Comments:       Anesthesia Quick Evaluation

## 2018-09-07 NOTE — Anesthesia Procedure Notes (Signed)
Date/Time: 09/07/2018 10:16 AM Performed by: Cynda Familia, CRNA Oxygen Delivery Method: Simple face mask Placement Confirmation: positive ETCO2 and breath sounds checked- equal and bilateral Dental Injury: Teeth and Oropharynx as per pre-operative assessment  Comments: Extubated to face mask

## 2018-09-07 NOTE — Discharge Instructions (Signed)
Urethral Dilation, Care After Refer to this sheet in the next few weeks. These instructions provide you with information about caring for yourself after your procedure. Your health care provider may also give you more specific instructions. Your treatment has been planned according to current medical practices, but problems sometimes occur. Call your health care provider if you have any problems or questions after your procedure. What can I expect after the procedure? After the procedure, it is common to have:  Burning pain when urinating.  Blood in your urine.  A need to urinate frequently.  Follow these instructions at home: Medicines  Take over-the-counter and prescription medicines only as told by your health care provider.  If you were prescribed antibiotic medicine, take it as told by your health care provider. Do not stop taking the antibiotic even if you start to feel better. Driving  Do not drive or operate heavy machinery while taking prescription pain medicine.  Do not drive for 24 hours if you received a medicine to help you relax (sedative) during your procedure. General instructions   If you were sent home with a catheter, follow your health care provider's instructions about how and when to use it.  Drink enough fluid to keep your urine clear or pale yellow.  Return to your normal activities as told by your health care provider. Ask your health care provider what activities are safe for you.  Keep all follow-up visits as told by your health care provider. This is important. Contact a health care provider if:  Your urine is cloudy and smells bad.  You develop new bleeding when you urinate.  You pass blood clots when you urinate.  You have pain that does not get better with medicine. Get help right away if:  You develop new bleeding that does not stop.  You cannot pass urine.  You have a fever.  You have swelling, bruising, or discoloration of your  genital area. This includes the penis, scrotum, and inner thighs for men, and the outer genital organs (vulva) and inner thighs for women. This information is not intended to replace advice given to you by your health care provider. Make sure you discuss any questions you have with your health care provider. Document Released: 11/23/2015 Document Revised: 04/03/2016 Document Reviewed: 10/14/2015 Elsevier Interactive Patient Education  2018 [place] for Prostate Cancer, Care After Refer to this sheet in the next few weeks. These instructions provide you with information on caring for yourself after your procedure. Your health care provider may also give you more specific instructions. Your treatment has been planned according to current medical practices, but problems sometimes occur. Call your health care provider if you have any problems or questions after your procedure. What can I expect after the procedure? The area behind the scrotum will probably be tender and bruised. For a short period of time you may have:  Difficulty passing urine. You may need a catheter for a few days to a month.  Blood in the urine or semen.  A feeling of constipation because of prostate swelling.  Frequent feeling of an urgent need to urinate.  For a long period of time you may have:  Inflammation of the rectum. This happens in about 2% of people who have the procedure.  Erection problems. These vary with age and occur in about 15-40% of men.  Difficulty urinating. This is caused by scarring in the urethra.  Diarrhea.  Follow these instructions at home:  Take  medicines only as directed by your health care provider.  You will probably have a catheter in your bladder for several days. You will have blood in the urine bag and should drink a lot of fluids to keep it a light red color.  Keep all follow-up visits as directed by your health care provider. If you have a catheter, it will  be removed during one of these visits.  Try not to sit directly on the area behind the scrotum. A soft cushion can decrease the discomfort. Ice packs may also be helpful for the discomfort. Do not put ice directly on the skin.  Shower and wash the area behind the scrotum gently. Do not sit in a tub.  If you have had the brachytherapy that uses the seeds, limit your close contact with children and pregnant women for 2 months because of the radiation still in the prostate. After that period of time, the levels drop off quickly. Get help right away if:  You have a fever.  You have chills.  You have shortness of breath.  You have chest pain.  You have thick blood, like tomato juice, in the urine bag.  Your catheter is blocked so urine cannot get into the bag. Your bladder area or lower abdomen may be swollen.  There is excessive bleeding from your rectum. It is normal to have a little blood mixed with your stool.  There is severe discomfort in the treated area that does not go away with pain medicine.  You have abdominal discomfort.  You have severe nausea or vomiting.  You develop any new or unusual symptoms. This information is not intended to replace advice given to you by your health care provider. Make sure you discuss any questions you have with your health care provider. Document Released: 11/29/2010 Document Revised: 04/09/2016 Document Reviewed: 04/19/2013 Elsevier Interactive Patient Education  2017 [facility] Home Care Instructions  Activity: Get plenty of rest for the remainder of the day. A responsible individual must stay with you for 24 hours following the procedure.  For the next 24 hours, DO NOT: -Drive a car -Paediatric nurse -Drink alcoholic beverages -Take any medication unless instructed by your physician -Make any legal decisions or sign important papers.  Meals: Start with liquid foods such as gelatin or soup. Progress to  regular foods as tolerated. Avoid greasy, spicy, heavy foods. If nausea and/or vomiting occur, drink only clear liquids until the nausea and/or vomiting subsides. Call your physician if vomiting continues.  Special Instructions/Symptoms: Your throat may feel dry or sore from the anesthesia or the breathing tube placed in your throat during surgery. If this causes discomfort, gargle with warm salt water. The discomfort should disappear within 24 hours.  If you had a scopolamine patch placed behind your ear for the management of post- operative nausea and/or vomiting:  1. The medication in the patch is effective for 72 hours, after which it should be removed.  Wrap patch in a tissue and discard in the trash. Wash hands thoroughly with soap and water. 2. You may remove the patch earlier than 72 hours if you experience unpleasant side effects which may include dry mouth, dizziness or visual disturbances. 3. Avoid touching the patch. Wash your hands with soap and water after contact with the patch.      Call your surgeon if you experience:   1.  Fever over 101.0. 2.  Inability to urinate. 3.  Nausea and/or vomiting. 4.  Extreme swelling or bruising at the surgical site. 5.  Continued bleeding from the incision. 6.  Increased pain, redness or drainage from the incision. 7.  Problems related to your pain medication. 8.  Any problems and/or concerns   NO IBUPROFEN PRODUCTS (MOTRIN, ADVIL) OR ALEVE UNTIL 4:15PM TODAY.

## 2018-09-07 NOTE — Transfer of Care (Signed)
Immediate Anesthesia Transfer of Care Note  Patient: Scott Wang  Procedure(s) Performed: RADIOACTIVE SEED IMPLANT/BRACHYTHERAPY IMPLANT (N/A ) SPACE OAR INSTILLATION (N/A )  Patient Location: PACU  Anesthesia Type:General  Level of Consciousness: sedated  Airway & Oxygen Therapy: Patient Spontanous Breathing and Patient connected to face mask oxygen  Post-op Assessment: Report given to RN and Post -op Vital signs reviewed and stable  Post vital signs: Reviewed and stable  Last Vitals:  Vitals Value Taken Time  BP    Temp    Pulse 60 09/07/2018 10:25 AM  Resp 14 09/07/2018 10:25 AM  SpO2 100 % 09/07/2018 10:25 AM  Vitals shown include unvalidated device data.  Last Pain:  Vitals:   09/07/18 0659  TempSrc:   PainSc: 0-No pain      Patients Stated Pain Goal: 7 (12/90/47 5339)  Complications: No apparent anesthesia complications

## 2018-09-07 NOTE — Anesthesia Procedure Notes (Signed)
Procedure Name: LMA Insertion Date/Time: 09/07/2018 8:56 AM Performed by: Cynda Familia, CRNA Pre-anesthesia Checklist: Patient identified, Emergency Drugs available, Suction available and Patient being monitored Patient Re-evaluated:Patient Re-evaluated prior to induction Oxygen Delivery Method: Circle System Utilized Preoxygenation: Pre-oxygenation with 100% oxygen Induction Type: IV induction Ventilation: Mask ventilation without difficulty LMA: LMA inserted LMA Size: 4.0 Tube type: Oral Number of attempts: 1 Placement Confirmation: positive ETCO2 Tube secured with: Tape Dental Injury: Teeth and Oropharynx as per pre-operative assessment  Comments: Smooth IV induction [place]man-- LMA insertion atraumatic-- left front tooth with crack present prior to LMA insertion-- unchanged after LMA insertion-- bilat BS

## 2018-09-07 NOTE — Op Note (Signed)
Preoperative diagnosis: Stage I (T1cNxMx) Prostate cancer Postoperative diagnosis: Stage I prostate cancer, bulb urethral stricture   Procedure: Cystoscopy, Foley catheter placement over wire, Prostate brachytherapy seed implant, cystoscopy, biodegradable gel (spaceroar) injection  Surgeon: Junious Silk  Radiation oncologist: Tammi Klippel  Anesthesia: Gen.  Indication for procedure: 77yo-year-old with stage I prostate cancer who elected to proceed with prostate brachytherapy.  Findings: On cystoscopy a mild stricture of the proximal bulb urethra. On fluoroscopic imaging there was adequate coverage of the prostate. On cystoscopy the urethra appeared normal, the prostatic urethra appeared normal, the trigone and ureteral orifices appeared normal with clear efflux. The bladder mucosa appeared normal. There were no stones, foreign bodies or seeds visualized in the bladder.  Dose: 145 Gy (22 catheters, 77 active sources)  Description of procedure: After consent was obtained patient brought to the operating room. After adequate anesthesia he is placed in lithotomy position.  He was prepped and draped in the usual sterile fashion.  The nurse met resistance with placing the Foley and blood was noted at the meatus.  Therefore, I passed the flexible cystoscope and a stricture was noted in the proximal bulb where the catheter had passed anteriorly and began to make a false passage.  Nurse did an excellent job of not applying too much pressure and just a small false passage was developing.  I was able to go under this and find the true lumen and travel a few centimeters down toward the membranous urethra up through the prostate and into the bladder.  I passed a sensor wire and over the sensor wire an 50 [place] [employer] tip catheter was advanced in left to gravity drainage with clear urine.    Dr. Tammi Klippel then placed the transrectal ultrasound probe and perineal template positioned. Catheters and brachytherapy  seeds were placed per Dr. Johny Shears plan. A total of 22 catheters and 77 active sources (I-125) were placed. The anchoring needles, template and ultrasound were removed. Scout flouro imaging was obtained of the implant.    An 18-gauge needle was then inserted approximately 1 to 2 cm anterior to the anal opening and directed under fluoroscopic guidance into the perirectal fat between the anterior rectal wall and the prostate capsule down to the mid-gland. Midline needle position was confirmed in the sagittal and axial views to verify the tip was in the perirectal fat.  Small amounts of saline were injected to hydrodissect the space between the prostate and the anterior rectal wall.  Axial imaging was viewed to confirm the needle was in the correct location in the mid gland and centered.  Aspiration confirmed no intravascular access.  The saline syringe was carefully disconnected maintaining the desired needle position and the hydrogel was attached to the needle.  Under ultrasound guidance in the sagittal view a smooth continuous injection was done over about 12 seconds delivering the hydrogel into the space between the prostate and rectal wall.  The needle was withdrawn.  The Foley was removed. The patient was prepped again and cystoscopy was performed which was noted to be normal. I performed an in and out catheter, red rubber, and it passed easily. I did not feel like the patient needed a foley. He was awakened taken to the recovery room in stable condition.  Complications: None  Blood loss: Minimal  Specimens: None  Drains: None   Disposition: Patient stable to PACU.

## 2018-09-07 NOTE — Interval H&P Note (Signed)
History and Physical Interval Note:  09/07/2018 8:38 AM  Scott Wang  has presented today for surgery, with the diagnosis of PROSTATE CANCER  The various methods of treatment have been discussed with the patient and family. After consideration of risks, benefits and other options for treatment, the patient has consented to  Procedure(s): RADIOACTIVE SEED IMPLANT/BRACHYTHERAPY IMPLANT (N/A) SPACE OAR INSTILLATION (N/A) as a surgical intervention .  I discussed with the patient and his wife again this morning the nature, potential benefits, risks and alternatives to brachy/SpaceOar, including side effects of the proposed treatment, the likelihood of the patient achieving the goals of the procedure, and any potential problems that might occur during the procedure or recuperation. All questions answered. Patient elects to proceed. The patient's history has been reviewed, patient examined, no change in status, stable for surgery.  I have reviewed the patient's chart and labs.    Scott Wang

## 2018-09-08 ENCOUNTER — Encounter (HOSPITAL_BASED_OUTPATIENT_CLINIC_OR_DEPARTMENT_OTHER): Payer: Self-pay | Admitting: Urology

## 2018-09-11 NOTE — Progress Notes (Signed)
  Radiation Oncology         [phone] ________________________________  Name: Scott Wang MRN: [id number]  Date: 09/11/2018  DOB: [birth year]       Prostate Seed Implant  CC:[name], [name], MD  No ref. provider found  DIAGNOSIS: 77 y.o. gentleman with Stage T1c adenocarcinoma of the prostate with Gleason Score of 3+3, and PSA of 5.16.    ICD-10-CM   1. Malignant neoplasm of prostate [facility]) [place] Discharge patient    PROCEDURE: Insertion of radioactive I-125 seeds into the prostate gland.  RADIATION DOSE: 145 Gy, definitive therapy.  TECHNIQUE: Scott Wang was brought to the operating room with the urologist. He was placed in the dorsolithotomy position. He was catheterized and a rectal tube was inserted. The perineum was shaved, prepped and draped. The ultrasound probe was then introduced into the rectum to see the prostate gland.  TREATMENT DEVICE: A needle grid was attached to the ultrasound probe stand and anchor needles were placed.  3D PLANNING: The prostate was imaged in 3D using a sagittal sweep of the prostate probe. These images were transferred to the planning computer. There, the prostate, urethra and rectum were defined on each axial reconstructed image. Then, the software created an optimized 3D plan and a few seed positions were adjusted. The quality of the plan was reviewed using [facility] information for the target and the following two organs at risk:  Urethra and Rectum.  Then the accepted plan was printed and handed off to the radiation therapist.  Under my supervision, the custom loading of the seeds and spacers was carried out and loaded into sealed vicryl sleeves.  These pre-loaded needles were then placed into the needle holder.[name]  PROSTATE VOLUME STUDY:  Using transrectal ultrasound the volume of the prostate was verified to be 42 cc.  SPECIAL TREATMENT PROCEDURE/SUPERVISION AND HANDLING: The pre-loaded needles were then delivered under sagittal guidance. A total of 22  needles were used to deposit 77 seeds in the prostate gland. The individual seed activity was 0.425 mCi.  SpaceOAR:  Yes  COMPLEX SIMULATION: At the end of the procedure, an anterior radiograph of the pelvis was obtained to document seed positioning and count. Cystoscopy was performed to check the urethra and bladder.  MICRODOSIMETRY: At the end of the procedure, the patient was emitting 0.147 mR/hr at 1 meter. Accordingly, he was considered safe for hospital discharge.  PLAN: The patient will return to the radiation oncology clinic for post implant CT dosimetry in three weeks.   ________________________________  Sheral Apley Tammi Klippel, M.D.

## 2018-09-21 ENCOUNTER — Telehealth: Payer: Self-pay | Admitting: *Deleted

## 2018-09-21 NOTE — Telephone Encounter (Signed)
Called patient to remind of post seed appts. for 09-22-18 and his MRI on 09-23-18, lvm for a return call

## 2018-09-22 ENCOUNTER — Ambulatory Visit
Admission: RE | Admit: 2018-09-22 | Discharge: 2018-09-22 | Disposition: A | Discharge status: Home / Self Care | Payer: Medicare Other | Source: Ambulatory Visit | Attending: Radiation Oncology | Admitting: Radiation Oncology

## 2018-09-22 ENCOUNTER — Encounter: Payer: Self-pay | Admitting: Radiation Oncology

## 2018-09-22 ENCOUNTER — Other Ambulatory Visit: Payer: Self-pay

## 2018-09-22 VITALS — BP 129/69 | HR 71 | Temp 98.0°F | Resp 20 | Ht 68.0 in | Wt 177.8 lb

## 2018-09-22 DIAGNOSIS — C61 Malignant neoplasm of prostate: Secondary | ICD-10-CM | POA: Diagnosis not present

## 2018-09-22 DIAGNOSIS — R3911 Hesitancy of micturition: Secondary | ICD-10-CM | POA: Diagnosis not present

## 2018-09-22 DIAGNOSIS — R350 Frequency of micturition: Secondary | ICD-10-CM | POA: Insufficient documentation

## 2018-09-22 NOTE — Progress Notes (Signed)
  Radiation Oncology         [phone] ________________________________  Name: Scott Wang MRN: [id number]  Date: 09/22/2018  DOB: May 01, 1941  COMPLEX SIMULATION NOTE  NARRATIVE:  The patient was brought to the [facility] today following prostate seed implantation approximately two weeks ago.  Identity was confirmed.  All relevant records and images related to the planned course of therapy were reviewed.  Then, the patient was set-up supine.  CT images were obtained.  The CT images were loaded into the planning software.  Then the prostate and rectum were contoured.  Treatment planning then occurred.  The implanted iridium-125 seeds were identified by the physics staff for projection of radiation distribution  I have requested : 3D Simulation.  I have requested a DVH of the following structures: Prostate and rectum.    ________________________________  Sheral Apley. Tammi Klippel, M.D.  This document serves as a record of services personally performed by Tyler Pita, MD. It was created on his behalf by Wilburn Mylar, a trained medical scribe. The creation of this record is based on the scribe's personal observations and the provider's statements to them. This document has been checked and approved by the attending provider.

## 2018-09-22 NOTE — Progress Notes (Signed)
Radiation Oncology         [phone] ________________________________  Name: JAEDYN LARD MRN: [id number]  Date: 09/22/2018  DOB: 11-Jan-1941  Post-Seed Follow-Up Visit Note  CC: Scott Gravel, MD  Scott Aloe, MD  Diagnosis:   77 y.o. gentleman with Stage T1c adenocarcinoma of the prostate with Gleason Score of 3+3, and PSA of 5.16.     ICD-10-CM   1. Malignant neoplasm of prostate ([place]) C61     Interval Since Last Radiation: 2 weeks 09/11/18: Insertion of radioactive I-125 seeds into the prostate gland, 145 Gy, definitive therapy with placement of SpaceOAR gel.  Narrative:  The patient returns today for routine follow-up.  He is complaining of increased urinary frequency and urinary hesitation symptoms. He filled out a questionnaire regarding urinary function today providing and overall IPSS score of 7 characterizing his symptoms as mild with increased urgency and occasional feelings of incomplete emptying, particularly at night.  In general, he feels that he empties his bladder well and denies excessive daytime frequency, dysuria, gross hematuria, straining to void or incontinence.  His pre-implant score was 10. He denies any bowel symptoms.  He is scheduled for MRI tomorrow to confirm SpaceOar placement. He saw Fritz Pickerel at [facility] yesterday, 09/21/18, for follow up and is scheduled to follow up with Dr. Junious Silk in 12/2018 with repeat PSA prior to that visit.   ALLERGIES:  is allergic to sulfa antibiotics.  Meds: Current Outpatient Medications  Medication Sig Dispense Refill  . alfuzosin (UROXATRAL) 10 MG 24 hr tablet Take 1 tablet (10 mg total) by mouth daily with breakfast. 30 tablet 1  . aspirin EC 81 MG tablet Take 81 mg by mouth daily.    [name] atorvastatin (LIPITOR) 10 MG tablet TAKE 1 TABLET BY MOUTH ONCE A DAY    . carvedilol (COREG) 3.125 MG tablet TAKE 1 TABLET BY MOUTH TWICE A DAY WITH FOOD    . Cholecalciferol (VITAMIN D3) 1000 units CAPS Take by mouth.    [name]  LISINOPRIL PO Take by mouth.      . losartan (COZAAR) 100 MG tablet TAKE 1 TABLET EVERY DAY    . metFORMIN (GLUCOPHAGE) 500 MG tablet TAKE 1 TABLET TWICE A DAY    . Multiple Vitamins-Minerals (MULTIVITAMIN ADULT PO) Take by mouth.    . Omega-3 1000 MG CAPS Take by mouth.    Glory Rosebush DELICA LANCETS FINE MISC TEST ONCE DAILY AND IF NEEDED AS DIRECTED BY PHYSICIAN    . sildenafil (REVATIO) 20 MG tablet Take 20 mg by mouth 3 (three) times daily.     No current facility-administered medications for this encounter.     Physical Findings: In general this is a well appearing Caucasian male in no acute distress. He's alert and oriented x4 and appropriate throughout the examination. Cardiopulmonary assessment is negative for acute distress and he exhibits normal effort.   Lab Findings: Lab Results  Component Value Date   WBC 7.2 08/31/2018   HGB 13.0 08/31/2018   HCT 39.9 08/31/2018   MCV 93.7 08/31/2018   PLT 205 08/31/2018    Radiographic Findings:  Patient underwent CT imaging in our clinic for post implant dosimetry. The CT was reviewed by Dr. Tammi Klippel and appears to demonstrate an adequate distribution of radioactive seeds throughout the prostate gland. There are no seeds in or near the rectum.  He is scheduled for prostate MRI on 09/23/2018 and these images will be fused with his CT images for further evaluation.  We suspect  the final radiation plan and dosimetry will show appropriate coverage of the prostate gland.   Impression/Plan: The patient is recovering from the effects of radiation. His urinary symptoms should gradually improve over the next 4-6 months. We talked about this today. He is encouraged by his improvement already and is otherwise pleased with his outcome. We also talked about long-term follow-up for prostate cancer following seed implant. He understands that ongoing PSA determinations and digital rectal exams will help perform surveillance to rule out disease recurrence. He  has a follow up appointment scheduled with Dr. Junious Silk in 12/2018. He understands what to expect with his PSA measures. Patient was also educated today about some of the long-term effects from radiation including a small risk for rectal bleeding and possibly erectile dysfunction. We talked about some of the general management approaches to these potential complications. However, I did encourage the patient to contact our office or return at any point if he has questions or concerns related to his previous radiation and prostate cancer.    Nicholos Johns, PA-C  This document serves as a record of services personally performed by [employer], PA-C. It was created on her behalf by Wilburn Mylar, a trained medical scribe. The creation of this record is based on the scribe's personal observations and the provider's statements to them. This document has been checked and approved by the attending provider.

## 2018-09-22 NOTE — Progress Notes (Signed)
Weight and vitals stable. Denies pain. Post seed IPSS 7. Denies dysuria, hematuria, urinary leakage or incontinence. Denies diarrhea. Scheduled for MRI tomorrow to confirm SpaceOar placement. Genevive Bi at [facility] yesterday for follow up. Scheduled to follow up with Dr. Junious Silk in three months.   BP 129/69 (BP Location: Right Arm, Patient Position: Sitting)   Pulse 71   Temp 98 F (36.7 C) (Oral)   Resp 20   Ht 5\' 8"  (1.727 m)   Wt 177 lb 12.8 oz (80.6 kg)   SpO2 99%   BMI 27.03 kg/m  Wt Readings from Last 3 Encounters:  09/22/18 177 lb 12.8 oz (80.6 kg)  09/07/18 174 lb 1.6 oz (79 kg)  05/05/18 173 lb 6.4 oz (78.7 kg)

## 2018-09-22 NOTE — Addendum Note (Signed)
Encounter addended by: Heywood Footman, RN on: 09/22/2018 1:00 PM  Actions taken: Charge Capture section accepted

## 2018-09-23 ENCOUNTER — Encounter: Payer: Self-pay | Admitting: Medical Oncology

## 2018-09-23 ENCOUNTER — Ambulatory Visit (HOSPITAL_COMMUNITY)
Admission: RE | Admit: 2018-09-23 | Discharge: 2018-09-23 | Disposition: A | Discharge status: Home / Self Care | Payer: Medicare Other | Source: Ambulatory Visit | Attending: Urology | Admitting: Urology

## 2018-09-23 DIAGNOSIS — C61 Malignant neoplasm of prostate: Secondary | ICD-10-CM | POA: Diagnosis not present

## 2018-10-19 ENCOUNTER — Encounter: Payer: Self-pay | Admitting: Radiation Oncology

## 2018-10-24 NOTE — Progress Notes (Signed)
  Radiation Oncology         (336) [phone] ________________________________  Name: Scott Wang MRN: [id number]  Date: 10/19/2018  DOB: May 08, 1941  3D Planning Note   Prostate Brachytherapy Post-Implant Dosimetry  Diagnosis: 77 y.o. gentleman with Stage T1c adenocarcinoma of the prostate with Gleason Score of 3+3, and PSA of 5.16  Narrative: On a previous date, Scott Wang returned following prostate seed implantation for post implant planning. He underwent CT scan complex simulation to delineate the three-dimensional structures of the pelvis and demonstrate the radiation distribution.  Since that time, the seed localization, and complex isodose planning with dose volume histograms have now been completed.  Results:   Prostate Coverage - The dose of radiation delivered to the 90% or more of the prostate gland (D90) was 101.66% of the prescription dose. This exceeds our goal of greater than 90%. Rectal Sparing - The volume of rectal tissue receiving the prescription dose or higher was 0.0 cc. This falls under our thresholds tolerance of 1.0 cc.  Impression: The prostate seed implant appears to show adequate target coverage and appropriate rectal sparing.  Plan:  The patient will continue to follow with urology for ongoing PSA determinations. I would anticipate a high likelihood for local tumor control with minimal risk for rectal morbidity.  ________________________________  Sheral Apley Tammi Klippel, M.D.

## 2019-11-11 DIAGNOSIS — G20A1 Parkinson's disease without dyskinesia, without mention of fluctuations: Secondary | ICD-10-CM

## 2019-11-11 HISTORY — DX: Parkinson's disease without dyskinesia, without mention of fluctuations: G20.A1

## 2020-04-16 DIAGNOSIS — M48062 Spinal stenosis, lumbar region with neurogenic claudication: Secondary | ICD-10-CM | POA: Diagnosis not present

## 2020-04-16 DIAGNOSIS — M5136 Other intervertebral disc degeneration, lumbar region: Secondary | ICD-10-CM | POA: Diagnosis not present

## 2020-04-20 DIAGNOSIS — G5622 Lesion of ulnar nerve, left upper limb: Secondary | ICD-10-CM | POA: Diagnosis not present

## 2020-04-20 DIAGNOSIS — G5621 Lesion of ulnar nerve, right upper limb: Secondary | ICD-10-CM | POA: Diagnosis not present

## 2020-04-20 DIAGNOSIS — G5602 Carpal tunnel syndrome, left upper limb: Secondary | ICD-10-CM | POA: Diagnosis not present

## 2020-04-20 DIAGNOSIS — G5603 Carpal tunnel syndrome, bilateral upper limbs: Secondary | ICD-10-CM | POA: Diagnosis not present

## 2020-05-04 DIAGNOSIS — M48062 Spinal stenosis, lumbar region with neurogenic claudication: Secondary | ICD-10-CM | POA: Diagnosis not present

## 2020-05-04 DIAGNOSIS — M47816 Spondylosis without myelopathy or radiculopathy, lumbar region: Secondary | ICD-10-CM | POA: Diagnosis not present

## 2020-05-04 DIAGNOSIS — M47896 Other spondylosis, lumbar region: Secondary | ICD-10-CM | POA: Diagnosis not present

## 2020-05-08 DIAGNOSIS — R2681 Unsteadiness on feet: Secondary | ICD-10-CM | POA: Diagnosis not present

## 2020-05-08 DIAGNOSIS — E1169 Type 2 diabetes mellitus with other specified complication: Secondary | ICD-10-CM | POA: Diagnosis not present

## 2020-05-08 DIAGNOSIS — M4800 Spinal stenosis, site unspecified: Secondary | ICD-10-CM | POA: Diagnosis not present

## 2020-05-08 DIAGNOSIS — I10 Essential (primary) hypertension: Secondary | ICD-10-CM | POA: Diagnosis not present

## 2020-05-08 DIAGNOSIS — R82998 Other abnormal findings in urine: Secondary | ICD-10-CM | POA: Diagnosis not present

## 2020-05-08 DIAGNOSIS — G5603 Carpal tunnel syndrome, bilateral upper limbs: Secondary | ICD-10-CM | POA: Diagnosis not present

## 2020-05-08 DIAGNOSIS — R600 Localized edema: Secondary | ICD-10-CM | POA: Diagnosis not present

## 2020-05-08 DIAGNOSIS — C61 Malignant neoplasm of prostate: Secondary | ICD-10-CM | POA: Diagnosis not present

## 2020-05-08 DIAGNOSIS — E7849 Other hyperlipidemia: Secondary | ICD-10-CM | POA: Diagnosis not present

## 2020-05-09 ENCOUNTER — Other Ambulatory Visit: Payer: Self-pay

## 2020-05-09 ENCOUNTER — Ambulatory Visit (HOSPITAL_COMMUNITY)
Admission: RE | Admit: 2020-05-09 | Discharge: 2020-05-09 | Disposition: A | Discharge status: Home / Self Care | Payer: Medicare PPO | Source: Ambulatory Visit | Attending: Vascular Surgery | Admitting: Vascular Surgery

## 2020-05-09 ENCOUNTER — Other Ambulatory Visit (HOSPITAL_COMMUNITY): Payer: Self-pay | Admitting: Internal Medicine

## 2020-05-09 DIAGNOSIS — M7989 Other specified soft tissue disorders: Secondary | ICD-10-CM | POA: Diagnosis not present

## 2020-05-09 DIAGNOSIS — M79661 Pain in right lower leg: Secondary | ICD-10-CM

## 2020-06-05 DIAGNOSIS — M5416 Radiculopathy, lumbar region: Secondary | ICD-10-CM | POA: Diagnosis not present

## 2020-06-26 DIAGNOSIS — M5136 Other intervertebral disc degeneration, lumbar region: Secondary | ICD-10-CM | POA: Diagnosis not present

## 2020-07-04 DIAGNOSIS — H52203 Unspecified astigmatism, bilateral: Secondary | ICD-10-CM | POA: Diagnosis not present

## 2020-07-04 DIAGNOSIS — Z961 Presence of intraocular lens: Secondary | ICD-10-CM | POA: Diagnosis not present

## 2020-07-04 DIAGNOSIS — E119 Type 2 diabetes mellitus without complications: Secondary | ICD-10-CM | POA: Diagnosis not present

## 2020-07-31 DIAGNOSIS — E1169 Type 2 diabetes mellitus with other specified complication: Secondary | ICD-10-CM | POA: Diagnosis not present

## 2020-07-31 DIAGNOSIS — I10 Essential (primary) hypertension: Secondary | ICD-10-CM | POA: Diagnosis not present

## 2020-07-31 DIAGNOSIS — Z125 Encounter for screening for malignant neoplasm of prostate: Secondary | ICD-10-CM | POA: Diagnosis not present

## 2020-07-31 DIAGNOSIS — E785 Hyperlipidemia, unspecified: Secondary | ICD-10-CM | POA: Diagnosis not present

## 2020-08-07 ENCOUNTER — Other Ambulatory Visit: Payer: Self-pay | Admitting: Internal Medicine

## 2020-08-07 DIAGNOSIS — R2681 Unsteadiness on feet: Secondary | ICD-10-CM | POA: Diagnosis not present

## 2020-08-07 DIAGNOSIS — E1169 Type 2 diabetes mellitus with other specified complication: Secondary | ICD-10-CM | POA: Diagnosis not present

## 2020-08-07 DIAGNOSIS — Z23 Encounter for immunization: Secondary | ICD-10-CM | POA: Diagnosis not present

## 2020-08-07 DIAGNOSIS — R82998 Other abnormal findings in urine: Secondary | ICD-10-CM | POA: Diagnosis not present

## 2020-08-07 DIAGNOSIS — Z0000 Encounter for general adult medical examination without abnormal findings: Secondary | ICD-10-CM

## 2020-08-07 DIAGNOSIS — M4800 Spinal stenosis, site unspecified: Secondary | ICD-10-CM | POA: Diagnosis not present

## 2020-08-07 DIAGNOSIS — C61 Malignant neoplasm of prostate: Secondary | ICD-10-CM | POA: Diagnosis not present

## 2020-08-07 DIAGNOSIS — I10 Essential (primary) hypertension: Secondary | ICD-10-CM | POA: Diagnosis not present

## 2020-08-07 DIAGNOSIS — E785 Hyperlipidemia, unspecified: Secondary | ICD-10-CM | POA: Diagnosis not present

## 2020-09-03 DIAGNOSIS — B369 Superficial mycosis, unspecified: Secondary | ICD-10-CM | POA: Diagnosis not present

## 2020-09-03 DIAGNOSIS — H6122 Impacted cerumen, left ear: Secondary | ICD-10-CM | POA: Diagnosis not present

## 2020-09-03 DIAGNOSIS — H6242 Otitis externa in other diseases classified elsewhere, left ear: Secondary | ICD-10-CM | POA: Diagnosis not present

## 2020-09-13 ENCOUNTER — Encounter: Payer: Self-pay | Admitting: *Deleted

## 2020-09-13 NOTE — Progress Notes (Signed)
Referral notes from Dr Suella Broad.

## 2020-09-17 ENCOUNTER — Encounter: Payer: Self-pay | Admitting: Neurology

## 2020-09-17 ENCOUNTER — Ambulatory Visit: Payer: Medicare PPO | Admitting: Neurology

## 2020-09-17 VITALS — BP 159/78 | HR 65 | Ht 68.0 in | Wt 172.0 lb

## 2020-09-17 DIAGNOSIS — R2689 Other abnormalities of gait and mobility: Secondary | ICD-10-CM

## 2020-09-17 DIAGNOSIS — M4802 Spinal stenosis, cervical region: Secondary | ICD-10-CM | POA: Diagnosis not present

## 2020-09-17 DIAGNOSIS — R292 Abnormal reflex: Secondary | ICD-10-CM

## 2020-09-17 DIAGNOSIS — W19XXXA Unspecified fall, initial encounter: Secondary | ICD-10-CM

## 2020-09-17 DIAGNOSIS — R251 Tremor, unspecified: Secondary | ICD-10-CM

## 2020-09-17 DIAGNOSIS — F0390 Unspecified dementia without behavioral disturbance: Secondary | ICD-10-CM | POA: Diagnosis not present

## 2020-09-17 DIAGNOSIS — R270 Ataxia, unspecified: Secondary | ICD-10-CM | POA: Diagnosis not present

## 2020-09-17 DIAGNOSIS — R269 Unspecified abnormalities of gait and mobility: Secondary | ICD-10-CM | POA: Diagnosis not present

## 2020-09-17 DIAGNOSIS — G20 Parkinson's disease: Secondary | ICD-10-CM

## 2020-09-17 NOTE — Progress Notes (Signed)
[facility]    Provider:  Dr Jaynee Eagles Requesting Provider: Suella Broad, MD Primary Care Provider:  Sueanne Margarita, DO  CC:  Balance problems   HPI:  Scott Wang is a 79 y.o. male here as requested by Dr. Nelva Bush, MD for abnormality of gait and mobility. PMHx prostate cancer, HTN, HLD, diabetes.  I reviewed Dr. Lurena Nida notes: He has been seeing Dr. Herma Mering for bilateral L5 selective nerve root block (SNR B) which was some help, he still complaining of bilateral thigh pain, in the morning but his back is feeling much better.  He uses tramadol and Advil as needed, he feels his balance is off, his right lower leg is tight, but he does feel as though he is doing better, patient has central canal spinal stenosis, bilateral leg pain, severe spinal stenosis L4-L5 and L3-L4, previously evaluated by Dr. Rolena Infante.Dr. Rolena Infante potentially recommended decompression of his lumbar/lumbosacral spine if symptoms do not get better.    Here with his wife who provides much information. slides his feet. He doesn't pick his feet up. He has had tremors for 2 years. Tremors mostly when he is doing something. But he can have tremors at baseline or at rest. Not getting worse, wife provides most information today. Sometimes he feels off balance especially if turn quickly. He has fallen if he bend over. No FHx of tremors. He doesn't seem as with it but he does have hearing aids now. Feels his expressions has become less so. His voice is softer. Labile blood pressure. Not fitful in bed. He snores but not every night. Doesn't move at night. Difficulty with controlling saliva, Not tired during, not taking naps. He feels refreshed int he morning. A lot slower, reactions are slower. No FHx of Parkinson's disease. Not noticed taste or smell problems. Handwriting has always been messy. No difficulty swallowing. Not repeating things, memory not as good Mo hallucinations.Tremor started in the right hand and now is in the  left hand.   Reviewed notes, labs and imaging from outside physicians, which showed: see above  Review of Systems: Patient complains of symptoms per HPI as well as the following symptoms: falls. Pertinent negatives and positives per HPI. All others negative.   Social History   Socioeconomic History  . Marital status: Married    Spouse name: Not on file  . Number of children: 2  . Years of education: Not on file  . Highest education level: Some college, no degree  Occupational History  . Occupation: retired  Tobacco Use  . Smoking status: Former Smoker    Packs/day: 1.00    Types: Cigarettes    Quit date: 2016    Years since quitting: 5.8  . Smokeless tobacco: Never Used  Vaping Use  . Vaping Use: Never used  Substance and Sexual Activity  . Alcohol use: Never  . Drug use: Never  . 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 Medications have been unsuccessful.  Other Topics Concern  . Not on file  Social History Narrative   Resides in [place] with wife. Has two grown sons and three grandchildren.       Lives at home with wife   Right handed   Caffeine: 2 cups coffee/day   Social Determinants of Health   Financial Resource Strain:   . Difficulty of Paying Living Expenses: Not on file  Food Insecurity:   . Worried About Charity fundraiser in the Last Year: Not on file  .  Ran Out of Food in the Last Year: Not on file  Transportation Needs:   . Lack of Transportation (Medical): Not on file  . Lack of Transportation (Non-Medical): Not on file  Physical Activity:   . Days of Exercise per Week: Not on file  . Minutes of Exercise per Session: Not on file  Stress:   . Feeling of Stress : Not on file  Social Connections:   . Frequency of Communication with Friends and Family: Not on file  . Frequency of Social Gatherings with Friends and Family: Not on file  . Attends Religious Services: Not on file  . Active Member of Clubs or Organizations:  Not on file  . Attends Archivist Meetings: Not on file  . Marital Status: Not on file  Intimate Partner Violence:   . Fear of Current or Ex-Partner: Not on file  . Emotionally Abused: Not on file  . Physically Abused: Not on file  . Sexually Abused: Not on file    Family History  Problem Relation Age of Onset  . Breast cancer Mother   . Heart attack Father   . Prostate cancer Neg Hx   . Pancreatic cancer Neg Hx   . Colon cancer Neg Hx   . Tremor Neg Hx     Past Medical History:  Diagnosis Date  . Carpal tunnel syndrome    bilateral, had shots in each one and "haven't had any problems since".  . Diabetes mellitus   . Hyperlipidemia   . Hypertension   . Prostate cancer ([place])   . Spinal stenosis     Patient Active Problem List   Diagnosis Date Noted  . Malignant neoplasm of prostate ([place]) 05/05/2018  . Hypertension 06/10/2011  . Hyperlipidemia 06/10/2011  . Diabetes mellitus 06/10/2011  . Onychomycosis 06/10/2011  . Paronychia 06/10/2011    Past Surgical History:  Procedure Laterality Date  . HERNIA REPAIR    . HERNIA REPAIR    . INGUINAL HERNIA REPAIR    . KNEE SURGERY  right  . KNEE SURGERY Left   . PROSTATE BIOPSY    . RADIOACTIVE SEED IMPLANT N/A 09/07/2018   Procedure: RADIOACTIVE SEED IMPLANT/BRACHYTHERAPY IMPLANT;  Surgeon: [name], MD;  Location: [facility];  Service: Urology;  Laterality: N/A;  . SPACE OAR INSTILLATION N/A 09/07/2018   Procedure: SPACE OAR INSTILLATION;  Surgeon: [name], MD;  Location: [facility];  Service: Urology;  Laterality: N/A;    Current Outpatient Medications  Medication Sig Dispense Refill  . alfuzosin (UROXATRAL) 10 MG 24 hr tablet Take 1 tablet (10 mg total) by mouth daily with breakfast. 30 tablet 1  . aspirin EC 81 MG tablet Take 81 mg by mouth daily.    [name] atorvastatin (LIPITOR) 10 MG tablet TAKE 1 TABLET BY MOUTH ONCE A DAY    . carvedilol (COREG) 3.125 MG  tablet TAKE 1 TABLET BY MOUTH TWICE A DAY WITH FOOD    . Cholecalciferol (VITAMIN D3 PO) Take 2,000 Int'l Units by mouth daily.    . Cyanocobalamin (VITAMIN B-12 PO) Take 2,500 mcg by mouth daily.    [name] KRILL OIL PO Take by mouth. 750    . losartan (COZAAR) 100 MG tablet TAKE 1 TABLET EVERY DAY    . metFORMIN (GLUCOPHAGE) 500 MG tablet TAKE 1 TABLET TWICE A DAY    . Multiple Vitamins-Minerals (ONE-A-DAY 50 PLUS PO) Take 1 tablet by mouth daily.    . Omega-3 Fatty Acids (FISH  OIL PO) Take by mouth. 1400 , 2 per day    . traMADol (ULTRAM) 50 MG tablet Take 50 mg by mouth 4 (four) times daily as needed (pain).    Glory Rosebush DELICA LANCETS FINE MISC TEST ONCE DAILY AND IF NEEDED AS DIRECTED BY PHYSICIAN     No current facility-administered medications for this visit.    Allergies as of 09/17/2020 - Review Complete 09/17/2020  Allergen Reaction Noted  . Sulfa antibiotics Swelling and Rash 06/10/2011    Vitals: BP (!) 159/78 (BP Location: Right Arm, Patient Position: Sitting)   Pulse 65   Ht 5\' 8"  (1.727 m)   Wt 172 lb (78 kg)   BMI 26.15 kg/m  Last Weight:  Wt Readings from Last 1 Encounters:  09/17/20 172 lb (78 kg)   Last Height:   Ht Readings from Last 1 Encounters:  09/17/20 5\' 8"  (1.727 m)     Physical exam: Exam: Gen: NAD, conversant, well nourised, well groomed                     CV: RRR, no MRG. No Carotid Bruits. No peripheral edema, warm, nontender Eyes: Conjunctivae clear without exudates or hemorrhage  Neuro: Detailed Neurologic Exam  Speech:    Speech is normal; fluent and spontaneous with normal comprehension.  Cognition:    The patient is oriented to person, place, and time;     recent and remote memory intact;     language fluent;     normal attention, concentration,     fund of knowledge Cranial Nerves: hypomimia/masked facies    The pupils are equal, round, and reactive to light. Pupils too small to visualize fundi. Visual fields are full to finger  confrontation. Extraocular movements are intact. Trigeminal sensation is intact and the muscles of mastication are normal. The face is symmetric. The palate elevates in the midline. Hearing intact. Voice is normal. Shoulder shrug is normal. The tongue has normal motion without fasciculations.   Coordination:    Normal finger to nose   Gait:    Low clearance, decreased arm swing  Motor Observation:    No asymmetry, no atrophy, and no involuntary movements noted. Tone:    Increased tone right arm  Posture:    Posture is normal. normal erect    Strength:    Strength is V/V in the upper and lower limbs.      Sensation: intact to LT     Reflex Exam:  DTR's: Absent AJs otherwise deep tendon reflexes in the upper and lower extremities are brisk bilaterally.   Toes:    The toes are downgoing bilaterally.   Clonus:    Clonus is absent.    Assessment/Plan:  [age] year old here with his wife. Examination concerning, parkinsonism on examination, unstable, needs MRI of the brain to look for causes such as vascular dementia, NPH or other, MRI cervical spine to look for stenosis and DAT scan for Parkinson's spectrum disorder vs essential tremor or vascular cause of symptoms  Orders Placed This Encounter  Procedures  . MR CERVICAL SPINE WO CONTRAST  . MR BRAIN W WO CONTRAST  . NM BRAIN DATSCAN TUMOR LOC INFLAM SPECT 1 DAY  . CBC with Differential/Platelets  . Comprehensive metabolic panel  . B12 and Folate Panel  . TSH  . Methylmalonic acid, serum   Cc: Suella Broad, MD,  Sueanne Margarita, DO  [address], [facility] [address] [place],  [place] [address]  Phone [phone] Fax [phone]  I spent over 60 minutes of face-to-face and non-face-to-face time with patient on the  1. Abnormal gait   2. Fall, initial encounter   3. 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 Dementia without behavioral disturbance,  unspecified dementia type ([place])   5. Ataxia   6. Cervical stenosis of spinal canal   7. Abnormal deep tendon reflex   8. Tremor   9. Shuffling gait    diagnosis.  This included previsit chart review, lab review, study review, order entry, electronic health record documentation, patient education on the different diagnostic and therapeutic options, counseling and coordination of care, risks and benefits of management, compliance, or risk factor reduction

## 2020-09-17 NOTE — Patient Instructions (Addendum)
MRI of the brain w/wo contrast - [facility] will call MRI cervical spine -  imaging will call DAT Scan - [facility] will call   Brain DaTscan How to prepare and what to expect ?????????????????????????????????????What is a brain DaTscan? A brain DaTscan is a nuclear medicine scan. It uses radioactive material to diagnose some diseases of the brain, especially those that cause tremor (shakiness). DaTscan is a brand name for a drug called ioflupane I-123. A brain DaTscan is a form of radiology, because radiation is used to take pictures of the body. This radioactive drug is ordered especially for you. Because of this, we need at least 72 hours notice if you must cancel or reschedule your scan.   How does the scan work? You will be given a small dose of tracer (radioactive material) through an intravenous (IV) line. This tracer will collect in part of your brain and give off gamma rays. A special camera called a gamma camera will use these rays to produce pictures and measurements of your brain. How do I prepare? Some drugs will affect the results of your brain DaTscan. You will need to stop taking these drugs before your scan. The table on page 2 lists the drugs that need to be stopped, and for how many days before your scan. This list is in alphabetical order by the generic name of the drug. The common brand names are listed beneath the generic name. Please confirm these instructions with your doctor who prescribed the drug. Drugs to Stop Taking Before your scan, stop taking these medicines for the length of time shown: Name of Drug Stop Taking  Amoxapine 4 days before  Benztropine  Cogentin 3 days before  Bupropion (Aplenzin, Budeprion, Voxra, Wellbutrin, Zyban) 48 hours before  Buspirone 15 hours before  Citalopram 24 hours before  Cocaine 6 hours before  Escitalopram 24 hours before  Methamphetamine 24 hours before  Methylphenidate (Concerta, Metadate, Methylin,  Ritalin) 20 hours before  Paroxetine 24 hours before  Selegilene 48 hours before  Sertraline 3 days before  If you are breastfeeding, or if there is any chance you are pregnant, please tell the scheduler or technologist (the person who will help you prepare for your scan). How is the scan done? When you first arrive, we will ask you to drink a small cup of water with potassium iodine in it. This water may have a metallic taste.  An hour after you drink the potassium iodine water, the technologist will inject a small amount of tracer into a vein in your arm or hand through your IV.  You must stay in the department for 30 minutes after the injection.  You will then have a break for 3 hours. It is OK to eat and drink during this break.  You must return to the clinic after this 3-hour break to have images of your brain taken.  Then, 4 hours after you receive your tracer injection, the technologist will take images of your brain with the gamma camera. You will lie flat on the exam table while these images are being taken.  You must not move while the camera is taking pictures. If you move, the pictures will be blurry and may have to be taken again.  Taking the images will take 40 to 45 minutes. Your total time in the imaging room will be about 1 hour.  You may also have a low-dose CT scan of your brain to help confirm any results. A CT scan is  another way to take images inside your body.  It will take about 5 hours from the time you drink the potassium iodine water until the scans are complete. What will I feel during the scan? The technologist will help make you as comfortable as possible on the exam table for the scan.  You may feel some minor discomfort from the IV.  Lying still on the exam table may be hard for some patients.  The camera will be close to your head. This may make you feel confined or uneasy (claustrophobic). Please tell the doctor who referred you for this scan if you know you are  claustrophobic. Are there any side effects from the scan? Most of the radioactivity from the tracer will pass out of your body in your urine or stool. The rest simply goes away over time.  Bad reactions to this scan are very rare. Fewer than 1% of patients (fewer than 1 out of 100) have a bad reaction. Reactions may include headache, nausea, vertigo (dizziness), or dry mouth. How do I get the results? When the test is over, the nuclear medicine doctor will review your images, prepare a written report, and talk with your doctor about the results. Your doctor will then talk with you about the results and your treatment options. If you needed to stop taking any medicines on the day of your scan, ask your doctor when to start taking them again.  The potentially interfering drugs consist of: amoxapine, amphetamine, benztropine, bupropion, buspirone, citalopram, cocaine, mazindol, methamphetamine, methylphenidate, norephedrine, phentermine, escitalopram, phenylpropanolamine, selegiline, paroxetine, and sertraline

## 2020-09-19 ENCOUNTER — Telehealth: Payer: Self-pay | Admitting: Neurology

## 2020-09-19 ENCOUNTER — Telehealth: Payer: Self-pay | Admitting: *Deleted

## 2020-09-19 NOTE — Telephone Encounter (Signed)
Have informed Pt of results. Pt confirmed understanding of results. Gave Pt the number to contact GI if not heard from them by Friday.

## 2020-09-19 NOTE — Telephone Encounter (Signed)
-----   Message from Melvenia Beam, MD sent at 09/18/2020 10:13 AM EST ----- He is not Vitamin B12 deficient, kidney function stable, thyroid normal

## 2020-09-19 NOTE — Telephone Encounter (Addendum)
I called the pt and LVM asking for call back. When he calls back, please let him know that he does not have Vitamin B12 deficiency, his kidney function is stable, and his thyroid is normal. The next step is the imaging of his brain and cervical spine. The orders were sent to [facility] and they should be calling him soon to schedule. If he does not hear from them by Friday morning he can call [phone] to schedule.   Let me know if he has any further questions.

## 2020-09-19 NOTE — Telephone Encounter (Signed)
[employer] pending  

## 2020-09-19 NOTE — Telephone Encounter (Signed)
Noted thanks °

## 2020-09-19 NOTE — Telephone Encounter (Signed)
Mcarthur Rossetti Josem Kaufmann: [id number] (exp. 09/19/20 to 10/19/20) order sent to GI. They will reach out to the patient to schedule.

## 2020-09-21 LAB — B12 AND FOLATE PANEL
Folate: >20 ng/mL (ref >3.0)
Vitamin B-12: 1870 pg/mL — ABNORMAL HIGH (ref 232–1245)

## 2020-09-21 LAB — CBC WITH DIFFERENTIAL/PLATELET
Basophils Absolute: 0 10*3/uL (ref 0.0–0.2)
Basos: 1 %
EOS (ABSOLUTE): 0.2 10*3/uL (ref 0.0–0.4)
Eos: 4 %
Hematocrit: 39.1 % (ref 37.5–51.0)
Hemoglobin: 13.1 g/dL (ref 13.0–17.7)
Immature Grans (Abs): 0.1 10*3/uL (ref 0.0–0.1)
Immature Granulocytes: 1 %
Lymphocytes Absolute: 1.4 10*3/uL (ref 0.7–3.1)
Lymphs: 22 %
MCH: 30.8 pg (ref 26.6–33.0)
MCHC: 33.5 g/dL (ref 31.5–35.7)
MCV: 92 fL (ref 79–97)
Monocytes Absolute: 0.8 10*3/uL (ref 0.1–0.9)
Monocytes: 12 %
Neutrophils Absolute: 4.1 10*3/uL (ref 1.4–7.0)
Neutrophils: 60 %
Platelets: 210 10*3/uL (ref 150–450)
RBC: 4.26 x10E6/uL (ref 4.14–5.80)
RDW: 12.7 % (ref 11.6–15.4)
WBC: 6.7 10*3/uL (ref 3.4–10.8)

## 2020-09-21 LAB — COMPREHENSIVE METABOLIC PANEL
ALT: 27 IU/L (ref 0–44)
AST: 24 IU/L (ref 0–40)
Albumin/Globulin Ratio: 1.7 (ref 1.2–2.2)
Albumin: 4.1 g/dL (ref 3.7–4.7)
Alkaline Phosphatase: 40 IU/L — ABNORMAL LOW (ref 44–121)
BUN/Creatinine Ratio: 10 (ref 10–24)
BUN: 14 mg/dL (ref 8–27)
Bilirubin Total: 0.5 mg/dL (ref 0.0–1.2)
CO2: 23 mmol/L (ref 20–29)
Calcium: 10.4 mg/dL — ABNORMAL HIGH (ref 8.6–10.2)
Chloride: 103 mmol/L (ref 96–106)
Creatinine, Ser: 1.36 mg/dL — ABNORMAL HIGH (ref 0.76–1.27)
GFR calc Af Amer: 57 mL/min/{1.73_m2} — ABNORMAL LOW (ref >59)
GFR calc non Af Amer: 49 mL/min/{1.73_m2} — ABNORMAL LOW (ref >59)
Globulin, Total: 2.4 g/dL (ref 1.5–4.5)
Glucose: 96 mg/dL (ref 65–99)
Potassium: 4.6 mmol/L (ref 3.5–5.2)
Sodium: 139 mmol/L (ref 134–144)
Total Protein: 6.5 g/dL (ref 6.0–8.5)

## 2020-09-21 LAB — METHYLMALONIC ACID, SERUM: Methylmalonic Acid: 160 nmol/L (ref 0–378)

## 2020-09-21 LAB — TSH: TSH: 1.42 u[IU]/mL (ref 0.450–4.500)

## 2020-10-09 ENCOUNTER — Ambulatory Visit
Admission: RE | Admit: 2020-10-09 | Discharge: 2020-10-09 | Disposition: A | Discharge status: Home / Self Care | Payer: Medicare PPO | Source: Ambulatory Visit | Attending: Neurology | Admitting: Neurology

## 2020-10-09 ENCOUNTER — Other Ambulatory Visit: Payer: Self-pay

## 2020-10-09 DIAGNOSIS — G20 Parkinson's disease: Secondary | ICD-10-CM | POA: Diagnosis not present

## 2020-10-09 DIAGNOSIS — R292 Abnormal reflex: Secondary | ICD-10-CM | POA: Diagnosis not present

## 2020-10-09 DIAGNOSIS — F0390 Unspecified dementia without behavioral disturbance: Secondary | ICD-10-CM

## 2020-10-09 DIAGNOSIS — R269 Unspecified abnormalities of gait and mobility: Secondary | ICD-10-CM

## 2020-10-09 DIAGNOSIS — M4802 Spinal stenosis, cervical region: Secondary | ICD-10-CM

## 2020-10-09 DIAGNOSIS — W19XXXA Unspecified fall, initial encounter: Secondary | ICD-10-CM

## 2020-10-09 DIAGNOSIS — R270 Ataxia, unspecified: Secondary | ICD-10-CM | POA: Diagnosis not present

## 2020-10-09 MED ORDER — GADOBENATE DIMEGLUMINE 529 MG/ML IV SOLN
15.0000 mL | Freq: Once | INTRAVENOUS | Status: AC | PRN
  Administered 2020-10-09: 15 mL via INTRAVENOUS

## 2020-10-11 ENCOUNTER — Telehealth: Payer: Self-pay

## 2020-10-11 NOTE — Telephone Encounter (Addendum)
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 He should go forward with the DAT Scan to evaluate for parkinson's disease as discussed. Left # for questions.

## 2020-10-11 NOTE — Telephone Encounter (Signed)
Pt verified by name and DOB,  normal results given per provider, pt voiced understanding all question answered. °

## 2020-10-11 NOTE — Telephone Encounter (Signed)
-----   Message from Melvenia Beam, MD sent at 10/11/2020 10:47 AM EST ----- MRI of the brain is normal for age. I donlt have the cervical spine results yet thanks

## 2020-10-12 DIAGNOSIS — Z1212 Encounter for screening for malignant neoplasm of rectum: Secondary | ICD-10-CM | POA: Diagnosis not present

## 2020-11-06 ENCOUNTER — Ambulatory Visit (HOSPITAL_COMMUNITY)
Admission: RE | Admit: 2020-11-06 | Discharge: 2020-11-06 | Disposition: A | Discharge status: Home / Self Care | Payer: Medicare PPO | Source: Ambulatory Visit | Attending: Neurology | Admitting: Neurology

## 2020-11-06 ENCOUNTER — Other Ambulatory Visit: Payer: Self-pay

## 2020-11-06 DIAGNOSIS — R2689 Other abnormalities of gait and mobility: Secondary | ICD-10-CM | POA: Diagnosis not present

## 2020-11-06 DIAGNOSIS — R251 Tremor, unspecified: Secondary | ICD-10-CM

## 2020-11-06 DIAGNOSIS — G20 Parkinson's disease: Secondary | ICD-10-CM | POA: Insufficient documentation

## 2020-11-06 DIAGNOSIS — W19XXXA Unspecified fall, initial encounter: Secondary | ICD-10-CM

## 2020-11-06 DIAGNOSIS — R269 Unspecified abnormalities of gait and mobility: Secondary | ICD-10-CM

## 2020-11-06 DIAGNOSIS — G20C Parkinsonism, unspecified: Secondary | ICD-10-CM

## 2020-11-06 MED ORDER — POTASSIUM IODIDE (ANTIDOTE) 130 MG PO TABS: 130.0000 mg | ORAL_TABLET | Freq: Once | ORAL | Status: AC

## 2020-11-06 MED ORDER — POTASSIUM IODIDE (ANTIDOTE) 130 MG PO TABS
ORAL_TABLET | ORAL | Status: AC
  Administered 2020-11-06: 09:00:00 130 mg via ORAL
  Filled 2020-11-06: qty 1

## 2020-11-06 MED ORDER — IOFLUPANE I 123 185 MBQ/2.5ML IV SOLN
4.8500 | Freq: Once | INTRAVENOUS | Status: AC | PRN
  Administered 2020-11-06: 10:00:00 4.85 via INTRAVENOUS
  Filled 2020-11-06: qty 5

## 2020-11-15 ENCOUNTER — Other Ambulatory Visit (HOSPITAL_COMMUNITY): Payer: Medicare PPO

## 2020-11-15 ENCOUNTER — Telehealth: Payer: Self-pay | Admitting: Neurology

## 2020-11-15 ENCOUNTER — Ambulatory Visit (HOSPITAL_COMMUNITY): Admission: RE | Admit: 2020-11-15 | Payer: Medicare PPO | Source: Ambulatory Visit

## 2020-11-15 NOTE — Telephone Encounter (Signed)
Pt. states he had a scan done at [facility] is wanting to know the results. Please advise.

## 2020-11-15 NOTE — Telephone Encounter (Signed)
The results had been sent to pt's mychart.   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 We can discuss in the office this month, thank you  Written by Anson Fret, MD on 11/12/2020  9:31 AM EST

## 2020-11-15 NOTE — Telephone Encounter (Signed)
I called the patient and discussed results of DAT scan as per Dr Trevor Mace result note. Patient verbalized understanding. He did not have any questions at this time but I encouraged him to bring any questions to the visit on 11/29/20 at 3:30 pm. Pt verbalized appreciation for the call.

## 2020-11-29 ENCOUNTER — Encounter: Payer: Self-pay | Admitting: Neurology

## 2020-11-29 ENCOUNTER — Other Ambulatory Visit: Payer: Self-pay

## 2020-11-29 ENCOUNTER — Ambulatory Visit: Payer: Medicare PPO | Admitting: Neurology

## 2020-11-29 VITALS — BP 127/71 | HR 72 | Ht 68.0 in | Wt 176.0 lb

## 2020-11-29 DIAGNOSIS — G20 Parkinson's disease: Secondary | ICD-10-CM | POA: Diagnosis not present

## 2020-11-29 NOTE — Patient Instructions (Signed)
Parkinson's Disease Parkinson's disease is a type of movement disorder. It is a long-term condition that gets worse over time (is progressive). Each person with Parkinson's disease is affected differently. This condition limits your ability to control movements and move your body normally. The condition can range from mild to severe. Parkinson's disease tends to get worse slowly over several years. What are the causes? Parkinson's disease results from a loss of brain cells (neurons) that make a brain chemical called dopamine. Dopamine is needed to control movement. As the condition gets worse, neurons make less dopamine. This makes it hard to move or control your movements. The exact cause of the loss of neurons and why they make less dopamine is not known. Factors related to genes and the environment may contribute to the cause of Parkinson's disease. What increases the risk? The following factors may make you more likely to develop this condition:  Being male.  Being age 40 or older.  Having a family history of Parkinson's disease.  Having had a traumatic brain injury.  Having experienced depression.  Having been exposed to toxins, such as pesticides. What are the signs or symptoms? Symptoms of this condition can vary. The main symptoms are related to movement. These include:  A tremor or shaking while you are resting that you cannot control.  Stiffness in your neck, arms, and legs (rigidity).  Slowing of movement. You may lose facial expressions and have trouble making small movements that are needed to button clothing or brush your teeth.  An abnormal walk. You may walk with short, shuffling steps.  Loss of balance and stability when standing. You may sway, fall backward, and have trouble making turns. Other symptoms include:  Mental or cognitive changes including depression, anxiety, having false beliefs (delusions), or seeing, hearing, or feeling things that do not exist  (hallucinations).  Trouble speaking or swallowing.  Changes in bowel or bladder functions including constipation, having to go urgently or frequently, or not being able to control your bowel or bladder.  Changes in sleep habits or trouble sleeping. Parkinson's disease may be graded by severity of your condition as mild, moderate, or advanced. Parkinson's disease progression is different for everyone. You may not progress to the advanced stage.  Mild Parkinson's disease involves: ? Movement problems that do not affect daily activities. ? Movement problems on one side of the body.  Moderate Parkinson's disease involves: ? Movement problems on both sides of the body. ? Slowing of movement. ? Coordination and balance problems.  Advanced Parkinson's disease involves: ? Extreme difficulty walking. ? Inability to live alone safely. ? Signs of dementia, such as having trouble remembering things, doing daily tasks such as getting dressed, and problem solving.   How is this diagnosed? This condition is diagnosed by a specialist. A diagnosis may be made based on symptoms, your medical history, and a physical exam. You may also have brain imaging tests to check for a loss of dopamine-producing areas of the brain. How is this treated? There is no cure for Parkinson's disease. Treatment focuses on managing your symptoms. Treatment may include:  Medicines. Everyone responds to medicines differently. Your response may change over time. Work with your health care provider to find the best medicines for you.  Speech, occupational, and physical therapy.  Deep brain stimulation surgery to reduce tremors and other involuntary movements. Follow these instructions at home: Medicines  Take over-the-counter and prescription medicines only as told by your health care provider.  Avoid taking medicines that  can affect thinking, such as pain or sleeping medicines. Eating and drinking  Follow instructions  from your health care provider about eating or drinking restrictions.  Do not drink alcohol. Activity  Talk with your health care provider about if it is safe for you to drive.  Do exercises as told by your health care provider or physical therapist. Lifestyle  Install grab bars and railings in your home to prevent falls.  Do not use any products that contain nicotine or tobacco, such as cigarettes, e-cigarettes, and chewing tobacco. If you need help quitting, ask your health care provider.  Consider joining a support group for people with Parkinson's disease.      General instructions  Work with your health care provider to determine what you need help with and what your safety needs are.  Keep all follow-up visits as told by your health care provider, including any visits with a physical therapist, speech therapist, or occupational therapist. This is important. Contact a health care provider if:  Medicines do not help your symptoms.  You are unsteady or have fallen at home.  You need more support to function well at home.  You have trouble swallowing.  You have severe constipation.  You are having problems with side effects from your medicines.  You feel confused, anxious, or depressed. Get help right away if you:  Are injured after a fall.  See or hear things that are not real.  Cannot swallow without choking.  Have chest pain or trouble breathing.  Do not feel safe at home.  Have thoughts about hurting yourself or others. If you ever feel like you may hurt yourself or others, or have thoughts about taking your own life, get help right away. You can go to your nearest emergency department or call:  Your local emergency services (911 in the [place]  A suicide crisis helpline, such as the [place] at 951-278-0785. This is open 24 hours a day. Summary  Parkinson's disease is a long-term condition that gets worse over time. This  condition limits your ability to control your movements and move your body normally.  There is no cure for Parkinson's disease. Treatment focuses on managing your symptoms.  Work with your health care provider to determine what you need help with and what your safety needs are.  Keep all follow-up visits as told by your health care provider, including any visits with a physical therapist, speech therapist, or occupational therapist. This is important. This information is not intended to replace advice given to you by your health care provider. Make sure you discuss any questions you have with your health care provider. Document Revised: 01/13/2019 Document Reviewed: 01/13/2019 Elsevier Patient Education  [place].

## 2020-11-29 NOTE — Progress Notes (Signed)
[facility]    Provider:  Dr Jaynee Eagles Requesting Provider: Suella Broad, MD Primary Care Provider:  Sueanne Margarita, DO  CC:  Balance problems   Interval history 11/29/2020: Decreased radiotracer activity within the posterior aspect of the LEFT and RIGHT putamen while not definitive is a pattern suggestive of Parkinson's syndrome pathology. We discussed parkinson's disease in detail today.   DAT Scan: 11/06/2020:   MRI brain 10/09/2021: MRI brain (with and without) demonstrating: - Mild chronic small vessel ischemic disease. - No acute findings.  MRI c-spine 10/09/2020: MRI cervical spine (without) demonstrating: - At C3-4: disc bulging and facet hypertrophy with severe biforaminal stenosis. - At C4-5: right disc bulging and facet hypertrophy with severe right and moderate left foraminal stenosis.    HPI:  Scott Wang is a 80 y.o. male here as requested by Dr. Nelva Bush, MD for abnormality of gait and mobility. PMHx prostate cancer, HTN, HLD, diabetes.  I reviewed Dr. Lurena Nida notes: He has been seeing Dr. Herma Mering for bilateral L5 selective nerve root block (SNR B) which was some help, he still complaining of bilateral thigh pain, in the morning but his back is feeling much better.  He uses tramadol and Advil as needed, he feels his balance is off, his right lower leg is tight, but he does feel as though he is doing better, patient has central canal spinal stenosis, bilateral leg pain, severe spinal stenosis L4-L5 and L3-L4, previously evaluated by Dr. Rolena Infante.Dr. Rolena Infante potentially recommended decompression of his lumbar/lumbosacral spine if symptoms do not get better.    Here with his wife who provides much information. slides his feet. He doesn't pick his feet up. He has had tremors for 2 years. Tremors mostly when he is doing something. But he can have tremors at baseline or at rest. Not getting worse, wife provides most information today. Sometimes he feels off balance  especially if turn quickly. He has fallen if he bend over. No FHx of tremors. He doesn't seem as with it but he does have hearing aids now. Feels his expressions has become less so. His voice is softer. Labile blood pressure. Not fitful in bed. He snores but not every night. Doesn't move at night. Difficulty with controlling saliva, Not tired during, not taking naps. He feels refreshed int he morning. A lot slower, reactions are slower. No FHx of Parkinson's disease. Not noticed taste or smell problems. Handwriting has always been messy. No difficulty swallowing. Not repeating things, memory not as good Mo hallucinations.Tremor started in the right hand and now is in the left hand.   Reviewed notes, labs and imaging from outside physicians, which showed: see above  Review of Systems: Patient complains of symptoms per HPI as well as the following symptoms: none . Pertinent negatives and positives per HPI. All others negative    Social History   Socioeconomic History  . Marital status: Married    Spouse name: Not on file  . Number of children: 2  . Years of education: Not on file  . Highest education level: Some college, no degree  Occupational History  . Occupation: retired  Tobacco Use  . Smoking status: Former Smoker    Packs/day: 1.00    Types: Cigarettes    Quit date: 2016    Years since quitting: 6.0  . Smokeless tobacco: Never Used  Vaping Use  . Vaping Use: Never used  Substance and Sexual Activity  . Alcohol use: Never  . Drug use: Never  . 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 Medications have been unsuccessful.  Other Topics Concern  . Not on file  Social History Narrative   Resides in [place] with wife. Has two grown sons and three grandchildren.       Lives at home with wife   Right handed   Caffeine: 2 cups coffee/day   Social Determinants of Health   Financial Resource Strain: Not on file  Food Insecurity: Not on file   Transportation Needs: Not on file  Physical Activity: Not on file  Stress: Not on file  Social Connections: Not on file  Intimate Partner Violence: Not on file    Family History  Problem Relation Age of Onset  . Breast cancer Mother   . Heart attack Father   . Prostate cancer Neg Hx   . Pancreatic cancer Neg Hx   . Colon cancer Neg Hx   . Tremor Neg Hx     Past Medical History:  Diagnosis Date  . Carpal tunnel syndrome    bilateral, had shots in each one and "haven't had any problems since".  . Diabetes mellitus   . Hyperlipidemia   . Hypertension   . Prostate cancer ([place])   . Spinal stenosis     Patient Active Problem List   Diagnosis Date Noted  . Parkinson's disease ([place]) 12/02/2020  . Malignant neoplasm of prostate ([place]) 05/05/2018  . Hypertension 06/10/2011  . Hyperlipidemia 06/10/2011  . Diabetes mellitus 06/10/2011  . Onychomycosis 06/10/2011  . Paronychia 06/10/2011    Past Surgical History:  Procedure Laterality Date  . HERNIA REPAIR    . HERNIA REPAIR    . INGUINAL HERNIA REPAIR    . KNEE SURGERY  right  . KNEE SURGERY Left   . PROSTATE BIOPSY    . RADIOACTIVE SEED IMPLANT N/A 09/07/2018   Procedure: RADIOACTIVE SEED IMPLANT/BRACHYTHERAPY IMPLANT;  Surgeon: [name], MD;  [facility];  Service: Urology;  Laterality: N/A;  . SPACE OAR INSTILLATION N/A 09/07/2018   Procedure: SPACE OAR INSTILLATION;  Surgeon: [name], MD;  Location: [facility];  Service: Urology;  Laterality: N/A;    Current Outpatient Medications  Medication Sig Dispense Refill  . alfuzosin (UROXATRAL) 10 MG 24 hr tablet Take 1 tablet (10 mg total) by mouth daily with breakfast. 30 tablet 1  . aspirin EC 81 MG tablet Take 81 mg by mouth daily.    [name] atorvastatin (LIPITOR) 10 MG tablet TAKE 1 TABLET BY MOUTH ONCE A DAY    . carvedilol (COREG) 3.125 MG tablet TAKE 1 TABLET BY MOUTH TWICE A DAY WITH FOOD    . Cholecalciferol  (VITAMIN D3 PO) Take 2,000 Int'l Units by mouth daily.    . Cyanocobalamin (VITAMIN B-12 PO) Take 2,500 mcg by mouth daily.    [name] KRILL OIL PO Take by mouth. 750    . losartan (COZAAR) 100 MG tablet TAKE 1 TABLET EVERY DAY    . metFORMIN (GLUCOPHAGE) 500 MG tablet TAKE 1 TABLET TWICE A DAY    . Multiple Vitamins-Minerals (ONE-A-DAY 50 PLUS PO) Take 1 tablet by mouth daily.    . Omega-3 Fatty Acids (FISH OIL PO) Take by mouth. 1400 , 2 per day    . ONETOUCH DELICA LANCETS FINE MISC TEST ONCE DAILY AND IF NEEDED AS DIRECTED BY PHYSICIAN    . traMADol (ULTRAM) 50 MG tablet Take 50 mg by mouth 4 (four) times daily as needed (pain).  No current facility-administered medications for this visit.    Allergies as of 11/29/2020 - Review Complete 11/29/2020  Allergen Reaction Noted  . Sulfa antibiotics Swelling and Rash 06/10/2011    Vitals: BP 127/71 (BP Location: Right Arm, Patient Position: Sitting)   Pulse 72   Ht 5\' 8"  (1.727 m)   Wt 176 lb (79.8 kg)   BMI 26.76 kg/m  Last Weight:  Wt Readings from Last 1 Encounters:  11/29/20 176 lb (79.8 kg)   Last Height:   Ht Readings from Last 1 Encounters:  11/29/20 5\' 8"  (1.727 m)     Physical exam: Exam: Gen: NAD, conversant, well nourised, well groomed                     CV: RRR, no MRG. No Carotid Bruits. No peripheral edema, warm, nontender Eyes: Conjunctivae clear without exudates or hemorrhage  Neuro: Stable Detailed Neurologic Exam  Speech:    Speech is normal; fluent and spontaneous with normal comprehension.  Cognition:    The patient is oriented to person, place, and time;     recent and remote memory intact;     language fluent;     normal attention, concentration,     fund of knowledge Cranial Nerves: hypomimia/masked facies    The pupils are equal, round, and reactive to light. Pupils too small to visualize fundi. Visual fields are full to finger confrontation. Extraocular movements are intact. Trigeminal  sensation is intact and the muscles of mastication are normal. The face is symmetric. The palate elevates in the midline. Hearing intact. Voice is normal. Shoulder shrug is normal. The tongue has normal motion without fasciculations.   Coordination:    Normal finger to nose   Gait:    Low clearance, decreased arm swing  Motor Observation:    No asymmetry, no atrophy, and no involuntary movements noted. Tone:    Increased tone right arm  Posture:    Posture is normal. normal erect    Strength:    Strength is V/V in the upper and lower limbs.      Sensation: intact to LT     Reflex Exam:  DTR's: Absent AJs otherwise deep tendon reflexes in the upper and lower extremities are brisk bilaterally.   Toes:    The toes are downgoing bilaterally.   Clonus:    Clonus is absent.    Assessment/Plan:  [age] year old here with his wife. 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 - Get regular skin checks, PD associated with skn cancer, discussed again - Fall risk, discussed fall precautions - provided much Parkinson's information again, local groups, resources which we reviewed extensively and I answered all questions, I reviewed classes focused on PD and exercise, support groups, I encouraged they attend, exercise can slow down PD progression - Physical Therapy: For Parkinson's disease, gait abnormality, declined at this time  - Wife appears quite knowledgeable, we discuss progression of PD, memory/dementia. At this time they will to follow clinically and RTC in one year (sooner if they feel any significant progression)   No orders of the defined types were placed in this encounter.  Cc: Sueanne Margarita, DO,  Sueanne Margarita, DO  [facility]ina Ill, [facility] [address] [place], [place] [address]  Phone  [phone] Fax [phone]  I spent over 30 minutes  of face-to-face and non-face-to-face time with patient on the  1. Parkinson's disease ([place])    diagnosis.  This included previsit chart review, lab review, study review, order entry, electronic health record documentation, patient education on the different diagnostic and therapeutic options, counseling and coordination of care, risks and benefits of management, compliance, or risk factor reduction

## 2020-12-02 DIAGNOSIS — G20 Parkinson's disease: Secondary | ICD-10-CM | POA: Insufficient documentation

## 2020-12-04 DIAGNOSIS — H903 Sensorineural hearing loss, bilateral: Secondary | ICD-10-CM | POA: Diagnosis not present

## 2020-12-06 DIAGNOSIS — Z8546 Personal history of malignant neoplasm of prostate: Secondary | ICD-10-CM | POA: Diagnosis not present

## 2020-12-26 DIAGNOSIS — R3912 Poor urinary stream: Secondary | ICD-10-CM | POA: Diagnosis not present

## 2020-12-26 DIAGNOSIS — N5201 Erectile dysfunction due to arterial insufficiency: Secondary | ICD-10-CM | POA: Diagnosis not present

## 2020-12-26 DIAGNOSIS — Z8546 Personal history of malignant neoplasm of prostate: Secondary | ICD-10-CM | POA: Diagnosis not present

## 2021-01-14 ENCOUNTER — Other Ambulatory Visit: Payer: Self-pay | Admitting: Neurology

## 2021-01-14 ENCOUNTER — Telehealth: Payer: Self-pay | Admitting: Neurology

## 2021-01-14 ENCOUNTER — Encounter: Payer: Self-pay | Admitting: *Deleted

## 2021-01-14 MED ORDER — CARBIDOPA-LEVODOPA 25-100 MG PO TABS: ORAL_TABLET | ORAL | 6 refills | Status: DC

## 2021-01-14 NOTE — Telephone Encounter (Signed)
Spoke with the patient and he stated he had already picked up the medication but he is going to wait until Friday or Saturday to take it due to his wife having back surgery on Thursday.  We did discuss the instructions and he is aware to separate the medication from protein rich foods by 30 minutes to an hour.  He is to take this twice a day about 8 hours apart.  He understands to be mindful to stand up slowly while on this medication.  He verbalized appreciation for the call and I encouraged him to please call us with any questions or concerns he may have.  He did not have any further questions at the time of the call.

## 2021-01-14 NOTE — Telephone Encounter (Signed)
Yes, I called in the sinemet thanks

## 2021-01-14 NOTE — Telephone Encounter (Signed)
Pt. states he would like medication for tremors. Please advise.

## 2021-01-16 ENCOUNTER — Encounter: Payer: Self-pay | Admitting: Neurology

## 2021-02-08 DIAGNOSIS — I10 Essential (primary) hypertension: Secondary | ICD-10-CM | POA: Diagnosis not present

## 2021-02-08 DIAGNOSIS — E785 Hyperlipidemia, unspecified: Secondary | ICD-10-CM | POA: Diagnosis not present

## 2021-02-08 DIAGNOSIS — C61 Malignant neoplasm of prostate: Secondary | ICD-10-CM | POA: Diagnosis not present

## 2021-02-08 DIAGNOSIS — M4800 Spinal stenosis, site unspecified: Secondary | ICD-10-CM | POA: Diagnosis not present

## 2021-02-08 DIAGNOSIS — E1169 Type 2 diabetes mellitus with other specified complication: Secondary | ICD-10-CM | POA: Diagnosis not present

## 2021-02-08 DIAGNOSIS — R2681 Unsteadiness on feet: Secondary | ICD-10-CM | POA: Diagnosis not present

## 2021-02-08 DIAGNOSIS — G20 Parkinson's disease: Secondary | ICD-10-CM | POA: Diagnosis not present

## 2021-02-08 DIAGNOSIS — N1832 Chronic kidney disease, stage 3b: Secondary | ICD-10-CM | POA: Diagnosis not present

## 2021-02-28 DIAGNOSIS — M5416 Radiculopathy, lumbar region: Secondary | ICD-10-CM | POA: Diagnosis not present

## 2021-03-14 DIAGNOSIS — Z79891 Long term (current) use of opiate analgesic: Secondary | ICD-10-CM | POA: Diagnosis not present

## 2021-03-14 DIAGNOSIS — M1611 Unilateral primary osteoarthritis, right hip: Secondary | ICD-10-CM | POA: Diagnosis not present

## 2021-04-03 DIAGNOSIS — M25551 Pain in right hip: Secondary | ICD-10-CM | POA: Diagnosis not present

## 2021-04-11 DIAGNOSIS — M47816 Spondylosis without myelopathy or radiculopathy, lumbar region: Secondary | ICD-10-CM | POA: Diagnosis not present

## 2021-04-12 ENCOUNTER — Telehealth: Payer: Self-pay | Admitting: Neurology

## 2021-04-12 NOTE — Telephone Encounter (Signed)
Pt has called for a refill on his carbidopa-levodopa (SINEMET IR) 25-100 MG tablet to [id number]  but he is also asking for a stronger dose because the medication is not working, please call.

## 2021-04-15 MED ORDER — CARBIDOPA-LEVODOPA 25-100 MG PO TABS
ORAL_TABLET | ORAL | 0 refills | Status: DC
Start: 2021-04-15 — End: 2021-05-07

## 2021-04-15 NOTE — Telephone Encounter (Signed)
The patient returned my call.  He started the Sinemet in March, taking 1 tablet twice daily.  I asked him if he felt like the medication was just wearing off quickly or if he felt like it was not effective altogether and the patient reported that he does not feel like it is strong enough.  He denies any side effects or GI issues to the Sinemet.  He has 1/2-week of tablets left before he is due for a refill.  I let him know that since Dr. Jaynee Eagles is out of the office I would check with a covering physician to see what they recommend.  The patient verbalized understanding and appreciation.

## 2021-04-15 NOTE — Telephone Encounter (Signed)
Spoke with Dr. Brett Fairy regarding the patient's situation.  She has advised for him to take 1 tablet 3 times a day dosing 6 hours apart.  Advised patient this may result in some nausea, lightheadedness, sleepiness.  He can give this change in dose 2 weeks to determine how effective it is and then let us know.   I called the pt and LVM asking for call back.

## 2021-04-15 NOTE — Telephone Encounter (Signed)
Called pt & LVM asking for call back. Left office number in message.  °

## 2021-04-15 NOTE — Telephone Encounter (Signed)
The patient returned my call.  We discussed the new dosing schedule, 1 tablet 3 times a day, 6 hours between doses.  The patient is aware of potential side effects.  He was advised to give this 2 weeks to see if there is an improvement however if he is welcome to call in the meantime if needed.  We will send in a 1 month supply as he has an appointment in a few weeks with Dr. Lavell Anchors.  The patient verbalized appreciation.

## 2021-04-15 NOTE — Addendum Note (Signed)
Addended by: Gildardo Griffes on: 04/15/2021 03:26 PM   Modules accepted: Orders

## 2021-05-07 ENCOUNTER — Other Ambulatory Visit: Payer: Self-pay | Admitting: Neurology

## 2021-05-15 IMAGING — NM NM DATSCAN
2 series · 12 of 12 positions shown · non-contrast
Comparison: Brain MRI 10 09 20

CLINICAL DATA: 79-year-old male with random bilateral hand tremors.
Gait disturbance since 4343.

EXAM:
NUCLEAR MEDICINE BRAIN IMAGING WITH SPECT  (DaTscan )
TECHNIQUE: SPECT images of the brain were obtained after intravenous injection
of radiopharmaceutical. 4 hour post injection imaging. Appropriate
positioning. 130 mg i-STAT given orally for thyroid blockade.
RADIOPHARMACEUTICALS:  4.8 millicuries I 123 Ioflupane

[Series 1: spect - (id) _(id)_cor · 4.1mm · 4.14mm/px · 6 of 128 frames shown]
[frame 11/128]
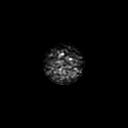
[frame 32/128]
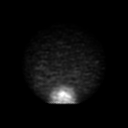
[frame 54/128]
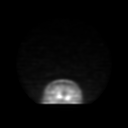
[frame 75/128]
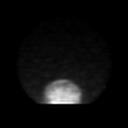
[frame 96/128]
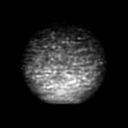
[frame 118/128]
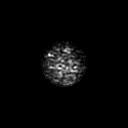

[Series 1: spect - (id) _(id)_tra · 4.1mm · 4.14mm/px · 6 of 128 frames shown]
[frame 11/128]
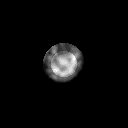
[frame 32/128]
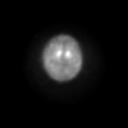
[frame 54/128]
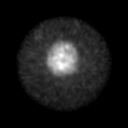
[frame 75/128]
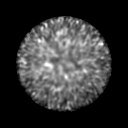
[frame 96/128]
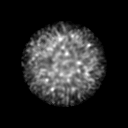
[frame 118/128]
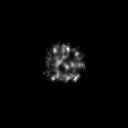

[12 of 12 positions shown; findings below may reference images not displayed]

FINDINGS: There is truncation of the posterior aspect of the putamen on the
LEFT and RIGHT. Reduction in posterior putamen activity is fairly
symmetric LEFT to RIGHT. Maintained relative normal symmetric
activity within the heads of the caudate nuclei.
IMPRESSION: Decreased radiotracer activity within the posterior aspect of the
LEFT and RIGHT putamen while not definitive is a pattern suggestive
of Parkinson's syndrome pathology.

Of note, DaTSCAN is not diagnostic of Parkinsonian syndromes, which
remains a clinical diagnosis. DaTscan is an adjuvant test to aid in
the clinical diagnosis of Parkinsonian syndromes.

## 2021-05-21 ENCOUNTER — Encounter: Payer: Self-pay | Admitting: Neurology

## 2021-05-21 ENCOUNTER — Other Ambulatory Visit: Payer: Self-pay

## 2021-05-21 ENCOUNTER — Ambulatory Visit: Payer: Medicare PPO | Admitting: Neurology

## 2021-05-21 VITALS — BP 128/70 | HR 68 | Ht 68.0 in | Wt 171.6 lb

## 2021-05-21 DIAGNOSIS — G20 Parkinson's disease: Secondary | ICD-10-CM

## 2021-05-21 NOTE — Progress Notes (Signed)
[facility]    Provider:  Dr Jaynee Eagles Requesting Provider: Suella Broad, MD Primary Care Provider:  Sueanne Margarita, DO  CC:  Balance problems   05/21/2021: He started 2 Sinemet IR a day and didn't feel that helped at all. Then went to 3 but that made him dizzy. Tried it every 6 hours. He wanted to try it and see if it would help a lot and didn't really help. And sometimes he does not shake at all. At this time will not take any medications. The shaking gets worse when he is tired or in certain situations, so could take 1/2 a pill or a whole pill situationally and see if that helps.He lost his balance ponce and fell. He declines PT, he declines classes we provided. No problems swallowing. He feels stable and his wife agrees. When he walks a little bit more shuffling. Bascially stable, no coughing or problems with food, appetite is good.   Interval history 11/29/2020: Decreased radiotracer activity within the posterior aspect of the LEFT and RIGHT putamen while not definitive is a pattern suggestive of Parkinson's syndrome pathology. We discussed parkinson's disease in detail today.   DAT Scan: 11/06/2020:   MRI brain 10/09/2021: MRI brain (with and without) demonstrating: - Mild chronic small vessel ischemic disease. - No acute findings.   MRI c-spine 10/09/2020: MRI cervical spine (without) demonstrating: - At C3-4: disc bulging and facet hypertrophy with severe biforaminal stenosis. - At C4-5: right disc bulging and facet hypertrophy with severe right and moderate left foraminal stenosis.    HPI:  Scott Wang is a 80 y.o. male here as requested by Dr. Nelva Bush, MD for abnormality of gait and mobility. PMHx prostate cancer, HTN, HLD, diabetes.  I reviewed Dr. Lurena Nida notes: He has been seeing Dr. Herma Mering for bilateral L5 selective nerve root block (SNR B) which was some help, he still complaining of bilateral thigh pain, in the morning but his back is feeling much better.  He  uses tramadol and Advil as needed, he feels his balance is off, his right lower leg is tight, but he does feel as though he is doing better, patient has central canal spinal stenosis, bilateral leg pain, severe spinal stenosis L4-L5 and L3-L4, previously evaluated by Dr. Rolena Infante.Dr. Rolena Infante potentially recommended decompression of his lumbar/lumbosacral spine if symptoms do not get better.    Here with his wife who provides much information. slides his feet. He doesn't pick his feet up. He has had tremors for 2 years. Tremors mostly when he is doing something. But he can have tremors at baseline or at rest. Not getting worse, wife provides most information today. Sometimes he feels off balance especially if turn quickly. He has fallen if he bend over. No FHx of tremors. He doesn't seem as with it but he does have hearing aids now. Feels his expressions has become less so. His voice is softer. Labile blood pressure. Not fitful in bed. He snores but not every night. Doesn't move at night. Difficulty with controlling saliva, Not tired during, not taking naps. He feels refreshed int he morning. A lot slower, reactions are slower. No FHx of Parkinson's disease. Not noticed taste or smell problems. Handwriting has always been messy. No difficulty swallowing. Not repeating things, memory not as good Mo hallucinations.Tremor started in the right hand and now is in the left hand.   Reviewed notes, labs and imaging from outside physicians, which showed: see above  Review of Systems: Patient complains of symptoms  per HPI as well as the following symptoms: dizziness . Pertinent negatives and positives per HPI. All others negative    Social History   Socioeconomic History   Marital status: Married    Spouse name: Not on file   Number of children: 2   Years of education: Not on file   Highest education level: Some college, no degree  Occupational History   Occupation: retired  Tobacco Use   Smoking status:  Former    Packs/day: 1.00    Pack years: 0.00    Types: Cigarettes    Quit date: 2016    Years since quitting: 6.5   Smokeless tobacco: Never  Vaping Use   Vaping Use: Never used  Substance and Sexual Activity   Alcohol use: Never   Drug use: Never   Sexual activity: Not Currently    Comment: 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 Medications have been unsuccessful.  Other Topics Concern   Not on file  Social History Narrative   Resides in [place] with wife. Has two grown sons and three grandchildren.       Lives at home with wife   Right handed   Caffeine: 2 cups coffee/day   Social Determinants of Health   Financial Resource Strain: Not on file  Food Insecurity: Not on file  Transportation Needs: Not on file  Physical Activity: Not on file  Stress: Not on file  Social Connections: Not on file  Intimate Partner Violence: Not on file    Family History  Problem Relation Age of Onset   Breast cancer Mother    Heart attack Father    Prostate cancer Neg Hx    Pancreatic cancer Neg Hx    Colon cancer Neg Hx    Tremor Neg Hx     Past Medical History:  Diagnosis Date   Carpal tunnel syndrome    bilateral, had shots in each one and "haven't had any problems since".   Diabetes mellitus    Hyperlipidemia    Hypertension    Prostate cancer [facility])    Spinal stenosis     Patient Active Problem List   Diagnosis Date Noted   Parkinson's disease ([place]) 12/02/2020   Malignant neoplasm of prostate ([place]) 05/05/2018   Hypertension 06/10/2011   Hyperlipidemia 06/10/2011   Diabetes mellitus 06/10/2011   Onychomycosis 06/10/2011   Paronychia 06/10/2011    Past Surgical History:  Procedure Laterality Date   HERNIA REPAIR     HERNIA REPAIR     INGUINAL HERNIA REPAIR     KNEE SURGERY  right   KNEE SURGERY Left    PROSTATE BIOPSY     RADIOACTIVE SEED IMPLANT N/A 09/07/2018   Procedure: RADIOACTIVE SEED IMPLANT/BRACHYTHERAPY IMPLANT;  Surgeon: [name], MD;  Location: [place];  Service: Urology;  Laterality: N/A;   SPACE OAR INSTILLATION N/A 09/07/2018   Procedure: SPACE OAR INSTILLATION;  Surgeon: [name], MD;  Location: [facility];  Service: Urology;  Laterality: N/A;    Current Outpatient Medications  Medication Sig Dispense Refill   alfuzosin (UROXATRAL) 10 MG 24 hr tablet Take 1 tablet (10 mg total) by mouth daily with breakfast. 30 tablet 1   aspirin EC 81 MG tablet Take 81 mg by mouth daily.     atorvastatin (LIPITOR) 10 MG tablet TAKE 1 TABLET BY MOUTH ONCE A DAY     carbidopa-levodopa (SINEMET IR) 25-100 MG tablet TAKE 1 TABLET THREE TIMES DAILY. TAKE 6 HOURS APART. TRY TO SEPARATE  FROM FOOD (ESPECIALLY PROTEIN-RICH FOODS LIKE MEAT, DAIRY, EGGS) BY ABOUT 30-60 MINS - THIS WILL HELP THE ABSORPTION OF THE MEDICATION. CAN TAKE IT WITH SOME LIGHT FOOD LIKE CRACKERS. 90 tablet 0   carvedilol (COREG) 3.125 MG tablet TAKE 1 TABLET BY MOUTH TWICE A DAY WITH FOOD     Cholecalciferol (VITAMIN D3 PO) Take 2,000 Int'l Units by mouth daily.     Cyanocobalamin (VITAMIN B-12 PO) Take 2,500 mcg by mouth daily.     KRILL OIL PO Take by mouth. 750     losartan (COZAAR) 100 MG tablet TAKE 1 TABLET EVERY DAY     metFORMIN (GLUCOPHAGE) 500 MG tablet TAKE 1 TABLET TWICE A DAY     Multiple Vitamins-Minerals (ONE-A-DAY 50 PLUS PO) Take 1 tablet by mouth daily.     Omega-3 Fatty Acids (FISH OIL PO) Take by mouth. 1400 , 2 per day     ONETOUCH DELICA LANCETS FINE MISC TEST ONCE DAILY AND IF NEEDED AS DIRECTED BY PHYSICIAN     traMADol (ULTRAM) 50 MG tablet Take 50 mg by mouth 4 (four) times daily as needed (pain).     No current facility-administered medications for this visit.    Allergies as of 05/21/2021 - Review Complete 05/21/2021  Allergen Reaction Noted   Sulfa antibiotics Swelling and Rash 06/10/2011    Vitals: BP 128/70   Pulse 68   Ht 5\' 8"  (1.727 m)   Wt 171 lb 9.6 oz (77.8 kg)   BMI 26.09 kg/m  Last Weight:  Wt  Readings from Last 1 Encounters:  05/21/21 171 lb 9.6 oz (77.8 kg)   Last Height:   Ht Readings from Last 1 Encounters:  05/21/21 5\' 8"  (1.727 m)     Physical exam: no change Exam: Gen: NAD, conversant, well nourised, well groomed                     CV: RRR, no MRG. No Carotid Bruits. No peripheral edema, warm, nontender Eyes: Conjunctivae clear without exudates or hemorrhage  Neuro: Stable Detailed Neurologic Exam  Speech:    Speech is normal; fluent and spontaneous with normal comprehension.  Cognition:    The patient is oriented to person, place, and time;     recent and remote memory intact;     language fluent;     normal attention, concentration,     fund of knowledge Cranial Nerves: hypomimia/masked facies    The pupils are equal, round, and reactive to light. Pupils too small to visualize fundi. Visual fields are full to finger confrontation. Extraocular movements are intact. Trigeminal sensation is intact and the muscles of mastication are normal. The face is symmetric. The palate elevates in the midline. Hearing intact. Voice is normal. Shoulder shrug is normal. The tongue has normal motion without fasciculations.   Coordination:    Normal finger to nose   Gait:    Low clearance, significantly decreased arm swing  Motor Observation:    No asymmetry, no atrophy, and no involuntary movements noted. Tone:    Increased tone right arm  Posture:    Posture is normal. normal erect    Strength:    Strength is V/V in the upper and lower limbs.      Sensation: intact to LT     Reflex Exam:  DTR's: Absent AJs otherwise deep tendon reflexes in the upper and lower extremities are brisk bilaterally.   Toes:    The toes are downgoing bilaterally.  Clonus:    Clonus is absent.    Assessment/Plan:  [age] year old here with his wife. 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 Stable.   - His symptoms are mild, will not  start Sinemet at this time per patient and wife preference. He tried it, made him dizzy, will hold off, can take as needed or try ER or [place].  - Get regular skin checks, PD associated with skn cancer, discussed again - Fall risk, discussed fall precautions - provided much Parkinson's information again, local groups, resources which we reviewed extensively and I answered all questions, I reviewed classes focused on PD and exercise, support groups, I encouraged they attend, exercise can slow down PD progression. Offered transfer or at least consult at [facility] movement team, not at this time. - Physical Therapy: For Parkinson's disease, gait abnormality, declined at this time  - Wife appears quite knowledgeable, we discuss progression of PD, memory/dementia. At this time they will to follow clinically and RTC in one year (sooner if they feel any significant progression) - Recommended seeing [facility]    No orders of the defined types were placed in this encounter.  Cc: Shona Needles, DO  Sarina Ill, [facility] [address] [place],  [address]  Phone [phone] Fax [phone]  I spent over 30 minutes of face-to-face and non-face-to-face time with patient on the  1. Parkinson's disease ([place])     diagnosis.  This included previsit chart review, lab review, study review, order entry, electronic health record documentation, patient education on the different diagnostic and therapeutic options, counseling and coordination of care, risks and benefits of management, compliance, or risk factor reduction

## 2021-05-21 NOTE — Patient Instructions (Signed)
Parkinson's Disease Parkinson's disease is a type of movement disorder. It is a long-term condition that gets worse over time (is progressive). Each person with Parkinson's disease is affected differently. This condition limits your ability to control movements and move your body normally. The condition can range from mild to severe. Parkinson's diseasetends to get worse slowly over several years. What are the causes? Parkinson's disease results from a loss of brain cells (neurons) that make a brain chemical called dopamine. Dopamine is needed to control movement. As the condition gets worse, neurons make less dopamine. This makesit hard to move or control your movements. The exact cause of the loss of neurons and why they make less dopamine is not known. Factors related to genes and the environment may contribute to the causeof Parkinson's disease. What increases the risk? The following factors may make you more likely to develop this condition: Being male. Being age 45 or older. Having a family history of Parkinson's disease. Having had a traumatic brain injury. Having experienced depression. Having been exposed to toxins, such as pesticides. What are the signs or symptoms? Symptoms of this condition can vary. The main symptoms are related to movement. These include: A tremor or shaking while you are resting that you cannot control. Stiffness in your neck, arms, and legs (rigidity). Slowing of movement. You may lose facial expressions and have trouble making small movements that are needed to button clothing or brush your teeth. An abnormal walk. You may walk with short, shuffling steps. Loss of balance and stability when standing. You may sway, fall backward, and have trouble making turns. Other symptoms include: Mental or cognitive changes including depression, anxiety, having false beliefs (delusions), or seeing, hearing, or feeling things that do not exist (hallucinations). Trouble  speaking or swallowing. Changes in bowel or bladder functions including constipation, having to go urgently or frequently, or not being able to control your bowel or bladder. Changes in sleep habits or trouble sleeping. Parkinson's disease may be graded by severity of your condition as mild, moderate, or advanced. Parkinson's disease progression is different for everyone. You may not progress to the advanced stage. Mild Parkinson's disease involves: Movement problems that do not affect daily activities. Movement problems on one side of the body. Moderate Parkinson's disease involves: Movement problems on both sides of the body. Slowing of movement. Coordination and balance problems. Advanced Parkinson's disease involves: Extreme difficulty walking. Inability to live alone safely. Signs of dementia, such as having trouble remembering things, doing daily tasks such as getting dressed, and problem solving. How is this diagnosed? This condition is diagnosed by a specialist. A diagnosis may be made based on symptoms, your medical history, and a physical exam. You may also have brainimaging tests to check for a loss of dopamine-producing areas of the brain. How is this treated? There is no cure for Parkinson's disease. Treatment focuses on managing your symptoms. Treatment may include: Medicines. Everyone responds to medicines differently. Your response may change over time. Work with your health care provider to find the best medicines for you. Speech, occupational, and physical therapy. Deep brain stimulation surgery to reduce tremors and other involuntary movements. Follow these instructions at home: Medicines Take over-the-counter and prescription medicines only as told by your health care provider. Avoid taking medicines that can affect thinking, such as pain or sleeping medicines. Eating and drinking Follow instructions from your health care provider about eating or drinking  restrictions. Do not drink alcohol. Activity Talk with your health care provider about  if it is safe for you to drive. Do exercises as told by your health care provider or physical therapist. Lifestyle     Install grab bars and railings in your home to prevent falls. Do not use any products that contain nicotine or tobacco, such as cigarettes, e-cigarettes, and chewing tobacco. If you need help quitting, ask your health care provider. Consider joining a support group for people with Parkinson's disease. General instructions Work with your health care provider to determine what you need help with and what your safety needs are. Keep all follow-up visits as told by your health care provider, including any visits with a physical therapist, speech therapist, or occupational therapist. This is important. Contact a health care provider if: Medicines do not help your symptoms. You are unsteady or have fallen at home. You need more support to function well at home. You have trouble swallowing. You have severe constipation. You are having problems with side effects from your medicines. You feel confused, anxious, or depressed. Get help right away if you: Are injured after a fall. See or hear things that are not real. Cannot swallow without choking. Have chest pain or trouble breathing. Do not feel safe at home. Have thoughts about hurting yourself or others. If you ever feel like you may hurt yourself or others, or have thoughts about taking your own life, get help right away. You can go to your nearest emergency department or call: Your local emergency services (911 in the [place] A suicide crisis helpline, such as the [place] at 917-519-2885. This is open 24 hours a day. Summary Parkinson's disease is a long-term condition that gets worse over time. This condition limits your ability to control your movements and move your body normally. There is no cure for  Parkinson's disease. Treatment focuses on managing your symptoms. Work with your health care provider to determine what you need help with and what your safety needs are. Keep all follow-up visits as told by your health care provider, including any visits with a physical therapist, speech therapist, or occupational therapist. This is important. This information is not intended to replace advice given to you by your health care provider. Make sure you discuss any questions you have with your healthcare provider. Document Revised: 01/13/2019 Document Reviewed: 01/13/2019 Elsevier Patient Education  [place].

## 2021-05-29 ENCOUNTER — Ambulatory Visit: Payer: Medicare PPO | Admitting: Neurology

## 2021-05-31 ENCOUNTER — Other Ambulatory Visit: Payer: Self-pay | Admitting: Neurology

## 2021-07-01 DIAGNOSIS — L309 Dermatitis, unspecified: Secondary | ICD-10-CM | POA: Diagnosis not present

## 2021-07-01 DIAGNOSIS — H938X2 Other specified disorders of left ear: Secondary | ICD-10-CM | POA: Diagnosis not present

## 2021-07-04 DIAGNOSIS — Z1339 Encounter for screening examination for other mental health and behavioral disorders: Secondary | ICD-10-CM | POA: Diagnosis not present

## 2021-07-04 DIAGNOSIS — W268XXA Contact with other sharp object(s), not elsewhere classified, initial encounter: Secondary | ICD-10-CM | POA: Diagnosis not present

## 2021-07-04 DIAGNOSIS — S51801A Unspecified open wound of right forearm, initial encounter: Secondary | ICD-10-CM | POA: Diagnosis not present

## 2021-07-05 DIAGNOSIS — H04123 Dry eye syndrome of bilateral lacrimal glands: Secondary | ICD-10-CM | POA: Diagnosis not present

## 2021-07-05 DIAGNOSIS — Z961 Presence of intraocular lens: Secondary | ICD-10-CM | POA: Diagnosis not present

## 2021-07-05 DIAGNOSIS — H52203 Unspecified astigmatism, bilateral: Secondary | ICD-10-CM | POA: Diagnosis not present

## 2021-07-05 DIAGNOSIS — E119 Type 2 diabetes mellitus without complications: Secondary | ICD-10-CM | POA: Diagnosis not present

## 2021-07-09 ENCOUNTER — Other Ambulatory Visit: Payer: Self-pay | Admitting: Internal Medicine

## 2021-07-09 DIAGNOSIS — N1832 Chronic kidney disease, stage 3b: Secondary | ICD-10-CM

## 2021-07-09 DIAGNOSIS — R918 Other nonspecific abnormal finding of lung field: Secondary | ICD-10-CM

## 2021-07-09 DIAGNOSIS — R053 Chronic cough: Secondary | ICD-10-CM

## 2021-08-05 ENCOUNTER — Other Ambulatory Visit: Payer: Self-pay

## 2021-08-05 ENCOUNTER — Ambulatory Visit
Admission: RE | Admit: 2021-08-05 | Discharge: 2021-08-05 | Disposition: A | Discharge status: Home / Self Care | Payer: Medicare PPO | Source: Ambulatory Visit | Attending: Internal Medicine | Admitting: Internal Medicine

## 2021-08-05 DIAGNOSIS — R053 Chronic cough: Secondary | ICD-10-CM | POA: Diagnosis not present

## 2021-08-05 DIAGNOSIS — I700 Atherosclerosis of aorta: Secondary | ICD-10-CM | POA: Diagnosis not present

## 2021-08-05 DIAGNOSIS — N1832 Chronic kidney disease, stage 3b: Secondary | ICD-10-CM

## 2021-08-05 DIAGNOSIS — J439 Emphysema, unspecified: Secondary | ICD-10-CM | POA: Diagnosis not present

## 2021-08-05 DIAGNOSIS — R918 Other nonspecific abnormal finding of lung field: Secondary | ICD-10-CM

## 2021-08-09 ENCOUNTER — Other Ambulatory Visit: Payer: Self-pay | Admitting: Neurology

## 2021-08-14 DIAGNOSIS — I10 Essential (primary) hypertension: Secondary | ICD-10-CM | POA: Diagnosis not present

## 2021-08-14 DIAGNOSIS — E1169 Type 2 diabetes mellitus with other specified complication: Secondary | ICD-10-CM | POA: Diagnosis not present

## 2021-08-14 DIAGNOSIS — Z125 Encounter for screening for malignant neoplasm of prostate: Secondary | ICD-10-CM | POA: Diagnosis not present

## 2021-08-14 DIAGNOSIS — E785 Hyperlipidemia, unspecified: Secondary | ICD-10-CM | POA: Diagnosis not present

## 2021-08-21 DIAGNOSIS — N1832 Chronic kidney disease, stage 3b: Secondary | ICD-10-CM | POA: Diagnosis not present

## 2021-08-21 DIAGNOSIS — I10 Essential (primary) hypertension: Secondary | ICD-10-CM | POA: Diagnosis not present

## 2021-08-21 DIAGNOSIS — Z0000 Encounter for general adult medical examination without abnormal findings: Secondary | ICD-10-CM | POA: Diagnosis not present

## 2021-08-21 DIAGNOSIS — E1169 Type 2 diabetes mellitus with other specified complication: Secondary | ICD-10-CM | POA: Diagnosis not present

## 2021-08-21 DIAGNOSIS — Z8546 Personal history of malignant neoplasm of prostate: Secondary | ICD-10-CM | POA: Diagnosis not present

## 2021-08-21 DIAGNOSIS — I700 Atherosclerosis of aorta: Secondary | ICD-10-CM | POA: Diagnosis not present

## 2021-08-21 DIAGNOSIS — Z23 Encounter for immunization: Secondary | ICD-10-CM | POA: Diagnosis not present

## 2021-08-21 DIAGNOSIS — J439 Emphysema, unspecified: Secondary | ICD-10-CM | POA: Diagnosis not present

## 2021-08-21 DIAGNOSIS — N183 Chronic kidney disease, stage 3 unspecified: Secondary | ICD-10-CM | POA: Diagnosis not present

## 2021-08-21 DIAGNOSIS — E785 Hyperlipidemia, unspecified: Secondary | ICD-10-CM | POA: Diagnosis not present

## 2021-08-21 DIAGNOSIS — G20 Parkinson's disease: Secondary | ICD-10-CM | POA: Diagnosis not present

## 2021-08-26 DIAGNOSIS — J439 Emphysema, unspecified: Secondary | ICD-10-CM | POA: Diagnosis not present

## 2021-08-26 DIAGNOSIS — G20 Parkinson's disease: Secondary | ICD-10-CM | POA: Diagnosis not present

## 2021-08-26 DIAGNOSIS — E1169 Type 2 diabetes mellitus with other specified complication: Secondary | ICD-10-CM | POA: Diagnosis not present

## 2021-08-26 DIAGNOSIS — R5383 Other fatigue: Secondary | ICD-10-CM | POA: Diagnosis not present

## 2021-08-26 DIAGNOSIS — R053 Chronic cough: Secondary | ICD-10-CM | POA: Diagnosis not present

## 2021-08-26 DIAGNOSIS — R0981 Nasal congestion: Secondary | ICD-10-CM | POA: Diagnosis not present

## 2021-08-26 DIAGNOSIS — Z1152 Encounter for screening for COVID-19: Secondary | ICD-10-CM | POA: Diagnosis not present

## 2021-08-26 DIAGNOSIS — J189 Pneumonia, unspecified organism: Secondary | ICD-10-CM | POA: Diagnosis not present

## 2021-08-29 DIAGNOSIS — M5416 Radiculopathy, lumbar region: Secondary | ICD-10-CM | POA: Diagnosis not present

## 2021-08-29 DIAGNOSIS — M48062 Spinal stenosis, lumbar region with neurogenic claudication: Secondary | ICD-10-CM | POA: Diagnosis not present

## 2021-08-29 DIAGNOSIS — Z79891 Long term (current) use of opiate analgesic: Secondary | ICD-10-CM | POA: Diagnosis not present

## 2021-09-02 ENCOUNTER — Other Ambulatory Visit: Payer: Self-pay | Admitting: Neurology

## 2021-09-02 MED ORDER — CARBIDOPA-LEVODOPA 25-100 MG PO TABS
1.0000 | ORAL_TABLET | Freq: Two times a day (BID) | ORAL | 3 refills | Status: DC
Start: 2021-09-02 — End: 2022-11-24

## 2021-09-02 MED ORDER — CARBIDOPA-LEVODOPA 25-100 MG PO TABS
ORAL_TABLET | ORAL | 3 refills | Status: DC
Start: 2021-09-02 — End: 2021-09-02

## 2021-09-02 NOTE — Telephone Encounter (Signed)
Spoke to wife of pt. Pt taking CL 25/100 mg po bid. Noted some benefit with no SE.

## 2021-09-02 NOTE — Telephone Encounter (Signed)
Pt request refill for carbidopa-levodopa (SINEMET IR) 25-100 MG tablet at [id number]

## 2021-09-05 DIAGNOSIS — L570 Actinic keratosis: Secondary | ICD-10-CM | POA: Diagnosis not present

## 2021-09-05 DIAGNOSIS — D225 Melanocytic nevi of trunk: Secondary | ICD-10-CM | POA: Diagnosis not present

## 2021-09-05 DIAGNOSIS — L814 Other melanin hyperpigmentation: Secondary | ICD-10-CM | POA: Diagnosis not present

## 2021-09-05 DIAGNOSIS — L821 Other seborrheic keratosis: Secondary | ICD-10-CM | POA: Diagnosis not present

## 2021-09-05 DIAGNOSIS — L819 Disorder of pigmentation, unspecified: Secondary | ICD-10-CM | POA: Diagnosis not present

## 2021-09-05 DIAGNOSIS — Z08 Encounter for follow-up examination after completed treatment for malignant neoplasm: Secondary | ICD-10-CM | POA: Diagnosis not present

## 2021-09-05 DIAGNOSIS — Z85828 Personal history of other malignant neoplasm of skin: Secondary | ICD-10-CM | POA: Diagnosis not present

## 2021-09-10 ENCOUNTER — Ambulatory Visit: Payer: Medicare PPO | Admitting: Pulmonary Disease

## 2021-09-10 ENCOUNTER — Encounter: Payer: Self-pay | Admitting: Pulmonary Disease

## 2021-09-10 ENCOUNTER — Ambulatory Visit (INDEPENDENT_AMBULATORY_CARE_PROVIDER_SITE_OTHER): Payer: Medicare PPO

## 2021-09-10 ENCOUNTER — Other Ambulatory Visit: Payer: Self-pay

## 2021-09-10 VITALS — BP 128/60 | HR 68 | Temp 97.5°F | Ht 68.0 in | Wt 168.4 lb

## 2021-09-10 DIAGNOSIS — R058 Other specified cough: Secondary | ICD-10-CM | POA: Diagnosis not present

## 2021-09-10 DIAGNOSIS — J189 Pneumonia, unspecified organism: Secondary | ICD-10-CM | POA: Diagnosis not present

## 2021-09-10 DIAGNOSIS — R059 Cough, unspecified: Secondary | ICD-10-CM | POA: Diagnosis not present

## 2021-09-10 DIAGNOSIS — J849 Interstitial pulmonary disease, unspecified: Secondary | ICD-10-CM | POA: Diagnosis not present

## 2021-09-10 NOTE — Progress Notes (Signed)
Scott Wang    [id number]    [birth year]  Primary Care Physician:Skakle, Liane Comber, DO  Referring Physician: Sueanne Margarita, [place],  [place] [facility] [place] [address]  Chief complaint: Consult for abnormal CT, concern for interstitial lung disease  HPI: 80 year old with history of Parkinson's, spinal stenosis, diabetes, hypertension, hyperlipidemia Complains of chronic cough for the past several years.  No dyspnea on exertion.  Cough is nonproductive in nature. Denies any joint pain except for spinal pain associated with stenosis.  No rash.  He has occasional difficulty swallowing and heartburn.  Has nighttime snoring but denies any daytime somnolence  He recently had a high-res CT at his primary care showing evidence of pulmonary fibrosis and has been referred here for further evaluation Per the patient he also had a chest x-ray a few weeks ago which showed pneumonia and was treated with antibiotics and prednisone from primary care office.   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  No feather pillows or comforter ILD questionnaire 09/10/2021-negative Smoking history: 20-pack-year smoker quit in 2010 Travel history: No significant travel history Relevant family history: No family history of lung disease   Outpatient Encounter Medications as of 09/10/2021  Medication Sig   alfuzosin (UROXATRAL) 10 MG 24 hr tablet Take 1 tablet (10 mg total) by mouth daily with breakfast.   aspirin EC 81 MG tablet Take 81 mg by mouth daily.   atorvastatin (LIPITOR) 10 MG tablet TAKE 1 TABLET BY MOUTH ONCE A DAY   carbidopa-levodopa (SINEMET IR) 25-100 MG tablet Take 1 tablet by mouth 2 (two) times daily.   carvedilol (COREG) 3.125 MG tablet TAKE 1 TABLET BY MOUTH TWICE A DAY WITH FOOD   Cholecalciferol (VITAMIN D3 PO) Take 2,000 Int'l Units by mouth daily.   Cyanocobalamin (VITAMIN B-12 PO) Take 2,500 mcg by mouth daily.   KRILL OIL  PO Take by mouth. 750   losartan (COZAAR) 100 MG tablet TAKE 1 TABLET EVERY DAY   metFORMIN (GLUCOPHAGE) 500 MG tablet TAKE 1 TABLET TWICE A DAY   Multiple Vitamins-Minerals (ONE-A-DAY 50 PLUS PO) Take 1 tablet by mouth daily.   Omega-3 Fatty Acids (FISH OIL PO) Take by mouth. 1400 , 2 per day   ONETOUCH DELICA LANCETS FINE MISC TEST ONCE DAILY AND IF NEEDED AS DIRECTED BY PHYSICIAN   traMADol (ULTRAM) 50 MG tablet Take 50 mg by mouth 4 (four) times daily as needed (pain).   No facility-administered encounter medications on file as of 09/10/2021.    Allergies as of 09/10/2021 - Review Complete 09/10/2021  Allergen Reaction Noted   Sulfa antibiotics Swelling and Rash 06/10/2011    Past Medical History:  Diagnosis Date   Carpal tunnel syndrome    bilateral, had shots in each one and "haven't had any problems since".   Diabetes mellitus    Hyperlipidemia    Hypertension    Prostate cancer ([place])    Spinal stenosis     Past Surgical History:  Procedure Laterality Date   HERNIA REPAIR     HERNIA REPAIR     INGUINAL HERNIA REPAIR     KNEE SURGERY  right   KNEE SURGERY Left    PROSTATE BIOPSY     RADIOACTIVE SEED IMPLANT N/A 09/07/2018   Procedure: RADIOACTIVE SEED IMPLANT/BRACHYTHERAPY IMPLANT;  Surgeon: [name], MD;  Location: [facility];  Service: Urology;  Laterality: N/A;   SPACE OAR INSTILLATION N/A  09/07/2018   Procedure: SPACE OAR INSTILLATION;  Surgeon: [name], MD;  Location: [facility];  Service: Urology;  Laterality: N/A;    Family History  Problem Relation Age of Onset   Breast cancer Mother    Heart attack Father    Prostate cancer Neg Hx    Pancreatic cancer Neg Hx    Colon cancer Neg Hx    Tremor Neg Hx     Social History   Socioeconomic History   Marital status: Married    Spouse name: Not on file   Number of children: 2   Years of education: Not on file   Highest education level: Some college, no  degree  Occupational History   Occupation: retired  Tobacco Use   Smoking status: Former    Packs/day: 1.00    Types: Cigarettes    Quit date: 2016    Years since quitting: 6.8   Smokeless tobacco: Never  Vaping Use   Vaping Use: Never used  Substance and Sexual Activity   Alcohol use: Never   Drug use: Never   Sexual activity: Not Currently    Comment: 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 Medications have been unsuccessful.  Other Topics Concern   Not on file  Social History Narrative   Resides in [place] with wife. Has two grown sons and three grandchildren.       Lives at home with wife   Right handed   Caffeine: 2 cups coffee/day   Social Determinants of Health   Financial Resource Strain: Not on file  Food Insecurity: Not on file  Transportation Needs: Not on file  Physical Activity: Not on file  Stress: Not on file  Social Connections: Not on file  Intimate Partner Violence: Not on file    Review of systems: Review of Systems  Constitutional: Negative for fever and chills.  HENT: Negative.   Eyes: Negative for blurred vision.  Respiratory: as per HPI  Cardiovascular: Negative for chest pain and palpitations.  Gastrointestinal: Negative for vomiting, diarrhea, blood per rectum. Genitourinary: Negative for dysuria, urgency, frequency and hematuria.  Musculoskeletal: Negative for myalgias, back pain and joint pain.  Skin: Negative for itching and rash.  Neurological: Negative for dizziness, tremors, focal weakness, seizures and loss of consciousness.  Endo/Heme/Allergies: Negative for environmental allergies.  Psychiatric/Behavioral: Negative for depression, suicidal ideas and hallucinations.  All other systems reviewed and are negative.  Physical Exam: Blood pressure 128/60, pulse 68, temperature (!) 97.5 F (36.4 C), temperature source Oral, height 5\' 8"  (1.727 m), weight 168 lb 6.4 oz (76.4 kg), SpO2 98 %. 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  I have reviewed the images personally.  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  Recent pneumonia He is recovering from a recent pneumonia We will get records from primary care Order chest x-ray today for evaluation  Plan/Recommendations: Connective tissue disease serologies, pulmonary function test Chest x-ray  Marshell Garfinkel MD [facility] 09/10/2021, 9:45 AM  CC: Sueanne Margarita, DO

## 2021-09-10 NOTE — Patient Instructions (Signed)
We will get some labs for further evaluation of the scarring process in the lung Will order pulmonary function testing follow-up with me in 1 to 2 months to discuss results and treatment options.

## 2021-09-12 LAB — ANA,IFA RA DIAG PNL W/RFLX TIT/PATN
Anti Nuclear Antibody (ANA): NEGATIVE
Cyclic Citrullin Peptide Ab: <16 UNITS
Rheumatoid fact SerPl-aCnc: <14 IU/mL (ref <14)

## 2021-09-12 LAB — ANTI-SCLERODERMA ANTIBODY: Scleroderma (Scl-70) (ENA) Antibody, IgG: <1 AI

## 2021-09-12 LAB — SJOGREN'S SYNDROME ANTIBODS(SSA + SSB)
SSA (Ro) (ENA) Antibody, IgG: <1 AI
SSB (La) (ENA) Antibody, IgG: <1 AI

## 2021-09-12 LAB — ANCA SCREEN W REFLEX TITER: ANCA Screen: NEGATIVE

## 2021-09-13 LAB — HYPERSENSITIVITY PNEUMONITIS
A. Pullulans Abs: NEGATIVE
A.Fumigatus #1 Abs: NEGATIVE
Micropolyspora faeni, IgG: NEGATIVE
Pigeon Serum Abs: NEGATIVE
Thermoact. Saccharii: NEGATIVE
Thermoactinomyces vulgaris, IgG: NEGATIVE

## 2021-09-24 LAB — MYOMARKER 3 PLUS PROFILE (RDL)

## 2021-11-12 ENCOUNTER — Telehealth: Payer: Self-pay | Admitting: Pulmonary Disease

## 2021-11-12 NOTE — Telephone Encounter (Signed)
Patient was last seen by Dr. Vaughan Browner on 09/10/21 and advised to follow up in 1-2 mos with PFT. He was scheduled for a PFT on 11/13/21 at 10am and OV with RB at 11am. Patient is established with Dr. Vaughan Browner.   I called the patient to see if I could get him rescheduled to see Dr. Vaughan Browner on 11/14/21. He did not answer so I left a message. I have placed a block on Dr. Matilde Bash schedule at 3pm in case he calls back.   Will keep this note open for follow up.

## 2021-11-13 ENCOUNTER — Ambulatory Visit: Payer: Medicare PPO | Admitting: Emergency Medicine

## 2021-11-13 ENCOUNTER — Ambulatory Visit (INDEPENDENT_AMBULATORY_CARE_PROVIDER_SITE_OTHER): Payer: Medicare PPO | Admitting: Pulmonary Disease

## 2021-11-13 ENCOUNTER — Other Ambulatory Visit: Payer: Self-pay

## 2021-11-13 DIAGNOSIS — J849 Interstitial pulmonary disease, unspecified: Secondary | ICD-10-CM | POA: Diagnosis not present

## 2021-11-13 LAB — PULMONARY FUNCTION TEST
DL/VA % pred: 81 %
DL/VA: 3.21 ml/min/mmHg/L
DLCO cor % pred: 79 %
DLCO cor: 18.43 ml/min/mmHg
DLCO unc % pred: 79 %
DLCO unc: 18.43 ml/min/mmHg
FEF 25-75 Post: 2.53 L/sec
FEF 25-75 Pre: 2.46 L/sec
FEF2575-%Change-Post: 2 %
FEF2575-%Pred-Post: 137 %
FEF2575-%Pred-Pre: 133 %
FEV1-%Change-Post: 1 %
FEV1-%Pred-Post: 100 %
FEV1-%Pred-Pre: 98 %
FEV1-Post: 2.66 L
FEV1-Pre: 2.63 L
FEV1FVC-%Change-Post: 1 %
FEV1FVC-%Pred-Pre: 108 %
FEV6-%Change-Post: 0 %
FEV6-%Pred-Post: 96 %
FEV6-%Pred-Pre: 97 %
FEV6-Post: 3.37 L
FEV6-Pre: 3.39 L
FEV6FVC-%Pred-Post: 107 %
FEV6FVC-%Pred-Pre: 107 %
FVC-%Change-Post: 0 %
FVC-%Pred-Post: 90 %
FVC-%Pred-Pre: 90 %
FVC-Post: 3.37 L
FVC-Pre: 3.39 L
Post FEV1/FVC ratio: 79 %
Post FEV6/FVC ratio: 100 %
Pre FEV1/FVC ratio: 78 %
Pre FEV6/FVC Ratio: 100 %
RV % pred: 98 %
RV: 2.5 L
TLC % pred: 87 %
TLC: 5.85 L

## 2021-11-13 NOTE — Patient Instructions (Signed)
Full PFT performed today. °

## 2021-11-13 NOTE — Progress Notes (Signed)
Full PFT performed today. °

## 2021-11-14 ENCOUNTER — Other Ambulatory Visit: Payer: Self-pay

## 2021-11-14 ENCOUNTER — Encounter: Payer: Self-pay | Admitting: Pulmonary Disease

## 2021-11-14 ENCOUNTER — Ambulatory Visit: Payer: Medicare PPO | Admitting: Pulmonary Disease

## 2021-11-14 VITALS — BP 136/60 | HR 83 | Temp 97.4°F | Ht 68.0 in | Wt 172.0 lb

## 2021-11-14 DIAGNOSIS — J849 Interstitial pulmonary disease, unspecified: Secondary | ICD-10-CM

## 2021-11-14 NOTE — Progress Notes (Signed)
Scott Wang    [id number]    [birth year]  Primary Care Physician:Skakle, Liane Comber, DO  Referring Physician: Sueanne Margarita, [place],  [place] [address]  Problem list: Follow-up for pulmonary fibrosis  HPI: 81 year old with history of Parkinson's, spinal stenosis, diabetes, hypertension, hyperlipidemia Complains of chronic cough for the past several years.  No dyspnea on exertion.  Cough is nonproductive in nature. Denies any joint pain except for spinal pain associated with stenosis.  No rash.  He has occasional difficulty swallowing and heartburn.  Has nighttime snoring but denies any daytime somnolence  He recently had a high-res CT at his primary care showing evidence of pulmonary fibrosis and has been referred here for further evaluation Per the patient he also had a chest x-ray a few weeks ago which showed pneumonia and was treated with antibiotics and prednisone from primary care office.   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  No feather pillows or comforter ILD questionnaire 09/10/2021-negative Smoking history: 20-pack-year smoker quit in 2010 Travel history: No significant travel history Relevant family history: No family history of lung disease  Interim history: Is here for review of his labs and PFTs States that breathing is doing well with no issues   Outpatient Encounter Medications as of 11/14/2021  Medication Sig   alfuzosin (UROXATRAL) 10 MG 24 hr tablet Take 1 tablet (10 mg total) by mouth daily with breakfast.   aspirin EC 81 MG tablet Take 81 mg by mouth daily.   atorvastatin (LIPITOR) 10 MG tablet TAKE 1 TABLET BY MOUTH ONCE A DAY   carbidopa-levodopa (SINEMET IR) 25-100 MG tablet Take 1 tablet by mouth 2 (two) times daily.   carvedilol (COREG) 3.125 MG tablet TAKE 1 TABLET BY MOUTH TWICE A DAY WITH FOOD   Cholecalciferol (VITAMIN D3 PO) Take 2,000 Int'l Units by mouth daily.    Cyanocobalamin (VITAMIN B-12 PO) Take 2,500 mcg by mouth daily.   KRILL OIL PO Take by mouth. 750   losartan (COZAAR) 100 MG tablet TAKE 1 TABLET EVERY DAY   metFORMIN (GLUCOPHAGE) 500 MG tablet TAKE 1 TABLET TWICE A DAY   Multiple Vitamins-Minerals (ONE-A-DAY 50 PLUS PO) Take 1 tablet by mouth daily.   Omega-3 Fatty Acids (FISH OIL PO) Take by mouth. 1400 , 2 per day   ONETOUCH DELICA LANCETS FINE MISC TEST ONCE DAILY AND IF NEEDED AS DIRECTED BY PHYSICIAN   traMADol (ULTRAM) 50 MG tablet Take 50 mg by mouth 4 (four) times daily as needed (pain).   No facility-administered encounter medications on file as of 11/14/2021.     Physical Exam: Blood pressure 136/60, pulse 83, temperature (!) 97.4 F (36.3 C), temperature source Oral, height 5\' 8"  (1.727 m), weight 172 lb (78 kg), SpO2 97 %. Gen:      No acute distress HEENT:  EOMI, sclera anicteric Neck:     No masses; no thyromegaly Lungs:    Clear to auscultation bilaterally; normal respiratory effort CV:         Regular rate and rhythm; no murmurs Abd:      + bowel sounds; soft, non-tender; no palpable masses, no distension Ext:    No edema; adequate peripheral perfusion.  Right hand tremor. Skin:      Warm and dry; no rash Neuro: alert and oriented x 3 Psych: normal mood and affect   Data Reviewed: Imaging: High-res CT 08/05/2021-basilar predominant fibrotic lung  disease in probable UIP pattern, coronary atherosclerosis, emphysema.  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 Follow-up with high-res CT in PFT later this year  Plan/Recommendations: High-res CT and PFTs in September 2023  Marshell Garfinkel MD [facility] 11/14/2021, 3:08 PM  CC: Sueanne Margarita, DO

## 2021-11-14 NOTE — Patient Instructions (Signed)
We discussed the findings on CT which shows a condition called pulmonary fibrosis.  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

## 2021-11-20 ENCOUNTER — Encounter: Payer: Self-pay | Admitting: Neurology

## 2021-11-20 ENCOUNTER — Other Ambulatory Visit: Payer: Self-pay

## 2021-11-20 ENCOUNTER — Ambulatory Visit: Payer: Medicare PPO | Admitting: Neurology

## 2021-11-20 VITALS — BP 130/74 | HR 68 | Ht 68.0 in | Wt 174.8 lb

## 2021-11-20 DIAGNOSIS — R413 Other amnesia: Secondary | ICD-10-CM | POA: Diagnosis not present

## 2021-11-20 DIAGNOSIS — R258 Other abnormal involuntary movements: Secondary | ICD-10-CM | POA: Diagnosis not present

## 2021-11-20 DIAGNOSIS — R2689 Other abnormalities of gait and mobility: Secondary | ICD-10-CM | POA: Diagnosis not present

## 2021-11-20 DIAGNOSIS — I951 Orthostatic hypotension: Secondary | ICD-10-CM

## 2021-11-20 DIAGNOSIS — E861 Hypovolemia: Secondary | ICD-10-CM

## 2021-11-20 DIAGNOSIS — R251 Tremor, unspecified: Secondary | ICD-10-CM | POA: Diagnosis not present

## 2021-11-20 DIAGNOSIS — M6289 Other specified disorders of muscle: Secondary | ICD-10-CM

## 2021-11-20 DIAGNOSIS — G20 Parkinson's disease: Secondary | ICD-10-CM

## 2021-11-20 MED ORDER — DONEPEZIL HCL 5 MG PO TABS: 5.0000 mg | ORAL_TABLET | Freq: Every day | ORAL | 6 refills | Status: DC

## 2021-11-20 NOTE — Patient Instructions (Addendum)
Start Aricept See below for tips on orthostatic dizziness (dizzy when standing)  Non-Drug Treatment for Low Blood Pressure on Standing:  1. Changing Postures:  Change posture slowly when getting up, especially in the morning  Hold on to something during the first few minutes after standing up. Do not start walking as soon as you get up from the chair  Avoid prolonged recumbency or lying down  Raise the head of the bed by 10 to 20 degrees  2. Exercise:  Perform Isotonic exercise, e.g.recumbent bike, pedaling movements while sitting in a chair  Avoid exercises where you have to strain   3. Avoid Pooling of blood in legs:  Wear custom-fitted elastic stockings. The ones which extend to the abdomen work even better. Consider wearing an abdominal binder.  Perform physical counter-maneuvers, such as crossing legs and tensing leg muscles.  4. Eating and Drinking:  Small meals are recommended. Avoid large meals. Avoid standing suddenly after a large meal  Avoid alcohol  Increase intake of fluids and regular salt. A daily intake of up to 10 grams of sodium per day and a fluid intake of 2.0 to 2.5 liters per day (8 to 10 glasses of water) is recommended.   Rapid (over 3 minutes) ingestion of approximately 0.5 liter (2 glasses of water) of tap water, raises blood pressure within 5 to 15 minutes and lasts for an hour.  5. Other Tips:  Avoid hot baths. Instead take warm baths  Maintain a BP record standing and lying down  If you are only any BP lowering drugs (antihypertensives, diuretics, antidepressants, drugs for prostate, etc) ask your doctor to revisit the need to keep you on these drugs  If all non-drug therapy fails, ask your doctor about drug therapy   Follow-up with primary care physician.  Donepezil Tablets What is this medication? DONEPEZIL (doe NEP e zil) treats memory loss and confusion (dementia) in people who have Alzheimer disease. It works by improving attention, memory, and the  ability to engage in daily activities. It is not a cure for dementia or Alzheimer disease. This medicine may be used for other purposes; ask your health care provider or pharmacist if you have questions. COMMON BRAND NAME(S): Aricept What should I tell my care team before I take this medication? They need to know if you have any of these conditions: Asthma or other lung disease Difficulty passing urine Head injury Heart disease History of irregular heartbeat Liver disease Seizures (convulsions) Stomach or intestinal disease, ulcers or stomach bleeding An unusual or allergic reaction to donepezil, other medications, foods, dyes, or preservatives Pregnant or trying to get pregnant Breast-feeding How should I use this medication? Take this medication by mouth with a glass of water. Follow the directions on the prescription label. You may take this medication with or without food. Take this medication at regular intervals. This medication is usually taken before bedtime. Do not take it more often than directed. Continue to take your medication even if you feel better. Do not stop taking except on your care team's advice. If you are taking the 23 mg donepezil tablet, swallow it whole; do not cut, crush, or chew it. Talk to your care team about the use of this medication in children. Special care may be needed. Overdosage: If you think you have taken too much of this medicine contact a poison control center or emergency room at once. NOTE: This medicine is only for you. Do not share this medicine with others. What if I  miss a dose? If you miss a dose, take it as soon as you can. If it is almost time for your next dose, take only that dose, do not take double or extra doses. What may interact with this medication? Do not take this medication with any of the following: Certain medications for fungal infections like itraconazole, fluconazole, posaconazole, and  voriconazole Cisapride Dextromethorphan; quinidine Dronedarone Pimozide Quinidine Thioridazine This medication may also interact with the following: Antihistamines for allergy, cough and cold Atropine Bethanechol Carbamazepine Certain medications for bladder problems like oxybutynin, tolterodine Certain medications for Parkinson's disease like benztropine, trihexyphenidyl Certain medications for stomach problems like dicyclomine, hyoscyamine Certain medications for travel sickness like scopolamine Dexamethasone Dofetilide Ipratropium NSAIDs, medications for pain and inflammation, like ibuprofen or naproxen Other medications for Alzheimer's disease Other medications that prolong the QT interval (cause an abnormal heart rhythm) Phenobarbital Phenytoin Rifampin, rifabutin or rifapentine Ziprasidone This list may not describe all possible interactions. Give your health care provider a list of all the medicines, herbs, non-prescription drugs, or dietary supplements you use. Also tell them if you smoke, drink alcohol, or use illegal drugs. Some items may interact with your medicine. What should I watch for while using this medication? Visit your care team for regular checks on your progress. Check with your care team if your symptoms do not get better or if they get worse. You may get drowsy or dizzy. Do not drive, use machinery, or do anything that needs mental alertness until you know how this medication affects you. What side effects may I notice from receiving this medication? Side effects that you should report to your care team as soon as possible: Allergic reactions--skin rash, itching, hives, swelling of the face, lips, tongue, or throat Peptic ulcer--burning stomach pain, loss of appetite, bloating, burping, heartburn, nausea, vomiting Seizures Slow heartbeat--dizziness, feeling faint or lightheaded, confusion, trouble breathing, unusual weakness or fatigue Stomach  bleeding--bloody or black, tar-like stools, vomiting blood or brown material that looks like coffee grounds Trouble passing urine Side effects that usually do not require medical attention (report these to your care team if they continue or are bothersome): Diarrhea Fatigue Loss of appetite Muscle pain or cramps Nausea Trouble sleeping This list may not describe all possible side effects. Call your doctor for medical advice about side effects. You may report side effects to FDA at 1-800-FDA-1088. Where should I keep my medication? Keep out of reach of children. Store at room temperature between 15 and 30 degrees C (59 and 86 degrees F). Throw away any unused medication after the expiration date. NOTE: This sheet is a summary. It may not cover all possible information. If you have questions about this medicine, talk to your doctor, pharmacist, or health care provider.  2022 Elsevier/Gold Standard (2021-06-12 00:00:00)

## 2021-11-20 NOTE — Progress Notes (Signed)
[facility]    Provider:  Dr Jaynee Eagles Requesting Provider: Suella Broad, MD Primary Care Provider:  Sueanne Margarita, DO  CC:  Parkinson's disease  11/20/2021: Stable. No swallowing problems. He drools a little bit, not really a problem, no falls, walking is a little worse. He gets dizzy when he gets up he gets dizzy, doesn't really drink a lot of fluids. He could start with hydrating better, usually dark, discussed, slowly get up in the morning, not as good as it used to be. Memory is not as good, decining as far as memory goes, discussed neuropsych testing and they declined, start a little aricept. He has tremors come and go, wife provides information and stable. He exercises with a stionary bike and uses it.   05/21/2021: He started 2 Sinemet IR a day and didn't feel that helped at all. Then went to 3 but that made him dizzy. Tried it every 6 hours. He wanted to try it and see if it would help a lot and didn't really help. And sometimes he does not shake at all. At this time will not take any medications. The shaking gets worse when he is tired or in certain situations, so could take 1/2 a pill or a whole pill situationally and see if that helps.He lost his balance ponce and fell. He declines PT, he declines classes we provided. No problems swallowing. He feels stable and his wife agrees. When he walks a little bit more shuffling. Bascially stable, no coughing or problems with food, appetite is good.   Interval history 11/29/2020: Decreased radiotracer activity within the posterior aspect of the LEFT and RIGHT putamen while not definitive is a pattern suggestive of Parkinson's syndrome pathology. We discussed parkinson's disease in detail today.   DAT Scan: 11/06/2020:   MRI brain 10/09/2021: MRI brain (with and without) demonstrating: - Mild chronic small vessel ischemic disease. - No acute findings.   MRI c-spine 10/09/2020: MRI cervical spine (without) demonstrating: - At  C3-4: disc bulging and facet hypertrophy with severe biforaminal stenosis. - At C4-5: right disc bulging and facet hypertrophy with severe right and moderate left foraminal stenosis.    HPI:  Scott Wang is a 81 y.o. male here as requested by Dr. Nelva Bush, MD for abnormality of gait and mobility. PMHx prostate cancer, HTN, HLD, diabetes.  I reviewed Dr. Lurena Nida notes: He has been seeing Dr. Herma Mering for bilateral L5 selective nerve root block (SNR B) which was some help, he still complaining of bilateral thigh pain, in the morning but his back is feeling much better.  He uses tramadol and Advil as needed, he feels his balance is off, his right lower leg is tight, but he does feel as though he is doing better, patient has central canal spinal stenosis, bilateral leg pain, severe spinal stenosis L4-L5 and L3-L4, previously evaluated by Dr. Rolena Infante.Dr. Rolena Infante potentially recommended decompression of his lumbar/lumbosacral spine if symptoms do not get better.    Here with his wife who provides much information. slides his feet. He doesn't pick his feet up. He has had tremors for 2 years. Tremors mostly when he is doing something. But he can have tremors at baseline or at rest. Not getting worse, wife provides most information today. Sometimes he feels off balance especially if turn quickly. He has fallen if he bend over. No FHx of tremors. He doesn't seem as with it but he does have hearing aids now. Feels his expressions has become less so. His voice  is softer. Labile blood pressure. Not fitful in bed. He snores but not every night. Doesn't move at night. Difficulty with controlling saliva, Not tired during, not taking naps. He feels refreshed int he morning. A lot slower, reactions are slower. No FHx of Parkinson's disease. Not noticed taste or smell problems. Handwriting has always been messy. No difficulty swallowing. Not repeating things, memory not as good Mo hallucinations.Tremor started in the right hand and  now is in the left hand.   Reviewed notes, labs and imaging from outside physicians, which showed: see above  Review of Systems: Patient complains of symptoms per HPI as well as the following symptoms: imbalance . Pertinent negatives and positives per HPI. All others negative     Social History   Socioeconomic History   Marital status: Married    Spouse name: Not on file   Number of children: 2   Years of education: Not on file   Highest education level: Some college, no degree  Occupational History   Occupation: retired  Tobacco Use   Smoking status: Former    Packs/day: 1.00    Types: Cigarettes    Quit date: 2016    Years since quitting: 7.0   Smokeless tobacco: Never  Vaping Use   Vaping Use: Never used  Substance and Sexual Activity   Alcohol use: Never   Drug use: Never   Sexual activity: Not Currently    Comment: 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 Medications have been unsuccessful.  Other Topics Concern   Not on file  Social History Narrative   Resides in [place] with wife. Has two grown sons and three grandchildren.       Lives at home with wife   Right handed   Caffeine: 2 cups coffee/day   Social Determinants of Health   Financial Resource Strain: Not on file  Food Insecurity: Not on file  Transportation Needs: Not on file  Physical Activity: Not on file  Stress: Not on file  Social Connections: Not on file  Intimate Partner Violence: Not on file    Family History  Problem Relation Age of Onset   Breast cancer Mother    Heart attack Father    Prostate cancer Neg Hx    Pancreatic cancer Neg Hx    Colon cancer Neg Hx    Tremor Neg Hx     Past Medical History:  Diagnosis Date   Carpal tunnel syndrome    bilateral, had shots in each one and "haven't had any problems since".   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   Parkinson's disease ([place]) 12/02/2020   Malignant neoplasm of prostate ([place]) 05/05/2018   Hypertension 06/10/2011   Hyperlipidemia 06/10/2011   Diabetes mellitus 06/10/2011   Onychomycosis 06/10/2011   Paronychia 06/10/2011    Past Surgical History:  Procedure Laterality Date   HERNIA REPAIR     HERNIA REPAIR     INGUINAL HERNIA REPAIR     KNEE SURGERY  right   KNEE SURGERY Left    PROSTATE BIOPSY     RADIOACTIVE SEED  IMPLANT N/A 09/07/2018   Procedure: RADIOACTIVE SEED IMPLANT/BRACHYTHERAPY IMPLANT;  Surgeon: [name], MD;  Location: [facility];  Service: Urology;  Laterality: N/A;   SPACE OAR INSTILLATION N/A 09/07/2018   Procedure: SPACE OAR INSTILLATION;  Surgeon: [name], MD;  Location: [facility];  Service: Urology;  Laterality: N/A;    Current Outpatient Medications  Medication Sig Dispense Refill   alfuzosin (UROXATRAL) 10 MG 24 hr tablet Take 1 tablet (10 mg total) by mouth daily with breakfast. 30 tablet 1   aspirin EC 81 MG tablet Take 81 mg by mouth daily.     atorvastatin (LIPITOR) 10 MG tablet TAKE 1 TABLET BY MOUTH ONCE A DAY     carbidopa-levodopa (SINEMET IR) 25-100 MG tablet Take 1 tablet by mouth 2 (two) times daily. 180 tablet 3   carvedilol (COREG) 3.125 MG tablet TAKE 1 TABLET BY MOUTH TWICE A DAY WITH FOOD     Cholecalciferol (VITAMIN D3 PO) Take 2,000 Int'l Units by mouth daily.     Cyanocobalamin (VITAMIN B-12 PO) Take 2,500 mcg by mouth daily.     donepezil (ARICEPT) 5 MG tablet Take 1 tablet (5 mg total) by mouth at bedtime. 30 tablet 6   KRILL OIL PO Take by mouth. 750     losartan (COZAAR) 100 MG tablet TAKE 1 TABLET EVERY DAY      metFORMIN (GLUCOPHAGE) 500 MG tablet TAKE 1 TABLET TWICE A DAY     Multiple Vitamins-Minerals (ONE-A-DAY 50 PLUS PO) Take 1 tablet by mouth daily.     Omega-3 Fatty Acids (FISH OIL PO) Take by mouth. 1400 , 2 per day     ONETOUCH DELICA LANCETS FINE MISC TEST ONCE DAILY AND IF NEEDED AS DIRECTED BY PHYSICIAN     traMADol (ULTRAM) 50 MG tablet Take 50 mg by mouth 4 (four) times daily as needed (pain).     No current facility-administered medications for this visit.    Allergies as of 11/20/2021 - Review Complete 11/20/2021  Allergen Reaction Noted   Sulfa antibiotics Swelling and Rash 06/10/2011    Vitals: BP 130/74    Pulse 68    Ht 5\' 8"  (1.727 m)    Wt 174 lb 12.8 oz (79.3 kg)    BMI 26.58 kg/m  Last Weight:  Wt Readings from Last 1 Encounters:  11/20/21 174 lb 12.8 oz (79.3 kg)   Last Height:   Ht Readings from Last 1 Encounters:  11/20/21 5\' 8"  (1.727 m)     Physical exam: no change Exam: Gen: NAD, conversant, well nourised, well groomed                     CV: RRR, no MRG. No Carotid Bruits. No peripheral edema, warm, nontender Eyes: Conjunctivae clear without exudates or hemorrhage  Neuro: Stable Detailed Neurologic Exam  Speech:    Speech is normal; fluent and spontaneous with normal comprehension.  Cognition:    The patient is oriented to person, place, and time;     recent and remote memory intact;     language fluent;     normal attention, concentration,     fund of knowledge Cranial Nerves: hypomimia/masked facies    The pupils are equal, round, and reactive to light. Pupils too small to visualize fundi. Visual fields are full to finger confrontation. Extraocular movements are intact. Trigeminal sensation is intact and the muscles of mastication are normal. The face is symmetric. The palate elevates in the  midline. Hearing intact. hypophonia. Shoulder shrug is normal. The tongue has normal motion without fasciculations.   Coordination:     baradykinesias  Gait:    Low clearance, significantly decreased arm swing with re-emergent tremor  Motor Observation:    No asymmetry, no atrophy, course resting tremor bilaterally Tone:    Increased tone right arm  Posture:    Posture is normal. normal erect    Strength:    Strength is V/V in the upper and lower limbs.      Sensation: intact to LT     Reflex Exam:  DTR's: Absent AJs otherwise deep tendon reflexes in the upper and lower extremities are brisk bilaterally.   Toes:    The toes are downgoing bilaterally.   Clonus:    Clonus is absent.    Assessment/Plan:  [age] year old here with his wife. 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 Stable per wife and patient   - His symptoms are mild, will not start Sinemet at this time per patient and wife preference. He tried it, made him dizzy, will hold off, can take as needed or try ER or Rytary. If he does have to take Iitrex he is foing to have to increase his fluids and wear compression stockings - wife says stable, I notice some progression in tremor and bradykinesia and hypophonia - Orthostasis: Not drinking enough water, discussed measures to take (see AVS and below) - Get regular skin checks, PD associated with skn cancer, discussed again - drools a little, monitor - Fall risk, discussed fall precautions again, denied PT, he is NOT using a walking aid today - provided much Parkinson's information again, local groups, resources which we reviewed extensively and I answered all questions, I reviewed classes focused on PD and exercise, support groups, I encouraged they attend, exercise can slow down PD progression. Offered transfer or at least consult at [facility] movement team, not at this time. - Physical Therapy: For Parkinson's disease, gait abnormality, declined at this time again - declined formal neurocog testing but will start aricept; Memory is not as good, decining as  far as memory goes, discussed neuropsych testing and they declined, start a little aricept, discussed side effects, his pulse is good, stop for diarrhea, if nightmares take in the morning, increase to 10mg  at next appointment - Wife appears quite knowledgeable, we discuss progression of PD, memory/dementia. At this time they will to follow clinically and RTC in one year (sooner if they feel any significant progression) - Recommended seeing [facility], declined - exercise, exercise - only thing known to slow down progresion - next time can see NP Jinny Blossom but we can alternate appointments I suspect he will need sinimet soon and there may be a problem with orthostasis  DISCUSSED:  Non-Drug Treatment for Low Blood Pressure on Standing:  1. Changing Postures:  Change posture slowly when getting up, especially in the morning  Hold on to something during the first few minutes after standing up. Do not start walking as soon as you get up from the chair  Avoid prolonged recumbency or lying down  Raise the head of the bed by 10 to 20 degrees  2. Exercise:  Perform Isotonic exercise, e.g.recumbent bike, pedaling movements while sitting in a chair  Avoid exercises where you have to strain   3. Avoid Pooling of blood in legs:  Wear custom-fitted elastic stockings. The ones which extend to the abdomen work even better. Consider wearing an  abdominal binder.  Perform physical counter-maneuvers, such as crossing legs and tensing leg muscles.  4. Eating and Drinking:  Small meals are recommended. Avoid large meals. Avoid standing suddenly after a large meal  Avoid alcohol  Increase intake of fluids and regular salt. A daily intake of up to 10 grams of sodium per day and a fluid intake of 2.0 to 2.5 liters per day (8 to 10 glasses of water) is recommended.   Rapid (over 3 minutes) ingestion of approximately 0.5 liter (2 glasses of water) of tap water, raises blood pressure within 5 to 15 minutes and lasts  for an hour.  5. Other Tips:  Avoid hot baths. Instead take warm baths  Maintain a BP record standing and lying down  If you are only any BP lowering drugs (antihypertensives, diuretics, antidepressants, drugs for prostate, etc) ask your doctor to revisit the need to keep you on these drugs  If all non-drug therapy fails, ask your doctor about drug therapy   Follow-up with primary care physician.      Meds ordered this encounter  Medications   donepezil (ARICEPT) 5 MG tablet    Sig: Take 1 tablet (5 mg total) by mouth at bedtime.    Dispense:  30 tablet    Refill:  6     Cc: Sueanne Margarita, DO,  Sueanne Margarita, DO  Sarina Ill, MD  [facility] [address] [place], [place] [address]  Phone [phone] Fax [phone]  I spent over 30 minutes of face-to-face and non-face-to-face time with patient on the  1. Parkinson's disease ([place])   2. Orthostatic hypotension due to poor fluid intake and parkinson's disease   3. not drinking enough fluids   4. Imbalance due to PD   5. Memory loss   6. 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 Muscle stiffness due to parkinsons disease   8. 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  This included previsit chart review, lab review, study review, order entry, electronic health record documentation, patient education on the different diagnostic and therapeutic options, counseling and coordination of care, risks and benefits of management, compliance, or risk factor reduction

## 2021-11-28 DIAGNOSIS — M5416 Radiculopathy, lumbar region: Secondary | ICD-10-CM | POA: Diagnosis not present

## 2021-11-29 ENCOUNTER — Encounter: Payer: Self-pay | Admitting: Neurology

## 2021-12-02 ENCOUNTER — Telehealth: Payer: Self-pay | Admitting: Neurology

## 2021-12-02 NOTE — Telephone Encounter (Signed)
Patient has Parkinson's disease and sialorrhea, uncontrollable drooling, he would like to try Botox for this and Dr. Rexene Alberts has very graciously and kindly offered to perform the procedures for me (thank you!)  As I do not have any experience doing this.  Lovena Le, can you get the approval process started, call patient for any questions, and get him scheduled with Dr. Rexene Alberts  Pod4: Can you fill out any forms needed please   Thank you all !

## 2021-12-03 NOTE — Telephone Encounter (Signed)
Indication: K11.7, CPT: [phone]

## 2021-12-03 NOTE — Telephone Encounter (Signed)
Patient currently has [employer], PA form placed in nurse pod for MD signature.

## 2021-12-03 NOTE — Telephone Encounter (Signed)
No problem. I would like to start with total dose of 50 Units, will need 100 vial authorized. We will go through informed consent at the time of the first injection.

## 2021-12-05 NOTE — Telephone Encounter (Signed)
I faxed completed Botox PA form to [facility].

## 2021-12-09 NOTE — Telephone Encounter (Signed)
Received approval from [facility] Reference #[id number] 12/05/2021-11/09/2022. I will reach out to the patient to get him scheduled for his initial Botox appointment.

## 2021-12-11 DIAGNOSIS — R42 Dizziness and giddiness: Secondary | ICD-10-CM | POA: Diagnosis not present

## 2021-12-11 DIAGNOSIS — H6122 Impacted cerumen, left ear: Secondary | ICD-10-CM | POA: Diagnosis not present

## 2021-12-11 DIAGNOSIS — Z87891 Personal history of nicotine dependence: Secondary | ICD-10-CM | POA: Diagnosis not present

## 2021-12-11 DIAGNOSIS — Z974 Presence of external hearing-aid: Secondary | ICD-10-CM | POA: Diagnosis not present

## 2021-12-11 DIAGNOSIS — H903 Sensorineural hearing loss, bilateral: Secondary | ICD-10-CM | POA: Diagnosis not present

## 2021-12-25 DIAGNOSIS — H903 Sensorineural hearing loss, bilateral: Secondary | ICD-10-CM | POA: Diagnosis not present

## 2021-12-25 DIAGNOSIS — H8112 Benign paroxysmal vertigo, left ear: Secondary | ICD-10-CM | POA: Diagnosis not present

## 2022-01-02 DIAGNOSIS — H8112 Benign paroxysmal vertigo, left ear: Secondary | ICD-10-CM | POA: Diagnosis not present

## 2022-01-03 DIAGNOSIS — Z8546 Personal history of malignant neoplasm of prostate: Secondary | ICD-10-CM | POA: Diagnosis not present

## 2022-01-08 DIAGNOSIS — M5416 Radiculopathy, lumbar region: Secondary | ICD-10-CM | POA: Diagnosis not present

## 2022-01-10 DIAGNOSIS — Z8546 Personal history of malignant neoplasm of prostate: Secondary | ICD-10-CM | POA: Diagnosis not present

## 2022-01-10 DIAGNOSIS — N5201 Erectile dysfunction due to arterial insufficiency: Secondary | ICD-10-CM | POA: Diagnosis not present

## 2022-01-14 DIAGNOSIS — M5416 Radiculopathy, lumbar region: Secondary | ICD-10-CM | POA: Diagnosis not present

## 2022-01-22 DIAGNOSIS — H8112 Benign paroxysmal vertigo, left ear: Secondary | ICD-10-CM | POA: Diagnosis not present

## 2022-01-28 DIAGNOSIS — M25551 Pain in right hip: Secondary | ICD-10-CM | POA: Diagnosis not present

## 2022-01-28 DIAGNOSIS — M5416 Radiculopathy, lumbar region: Secondary | ICD-10-CM | POA: Diagnosis not present

## 2022-02-01 DIAGNOSIS — T24132A Burn of first degree of left lower leg, initial encounter: Secondary | ICD-10-CM | POA: Diagnosis not present

## 2022-02-01 DIAGNOSIS — T24232A Burn of second degree of left lower leg, initial encounter: Secondary | ICD-10-CM | POA: Diagnosis not present

## 2022-02-03 ENCOUNTER — Ambulatory Visit: Payer: Medicare PPO | Admitting: Neurology

## 2022-02-03 VITALS — BP 128/62 | HR 79

## 2022-02-03 DIAGNOSIS — K117 Disturbances of salivary secretion: Secondary | ICD-10-CM

## 2022-02-03 MED ORDER — ONABOTULINUMTOXINA 100 UNITS IJ SOLR
100.0000 [IU] | Freq: Once | INTRAMUSCULAR | Status: AC
  Administered 2022-02-03: 100 [IU] via INTRAMUSCULAR

## 2022-02-03 NOTE — Progress Notes (Deleted)
Subjective:  ?  ?Patient ID: Scott Wang is a 81 y.o. male. ? ?HPI ? ? ?Review of Systems  ?Neurological:   ?     Pt is here for Botox procedure  ? ?Botox- 100 units x 1 vials ?TVI:[id number] ?Expiration: 01/2024 ?[place]: [phone] ? ?Bacteriostatic 0.9% Sodium Chloride- 1.67m total ?Lot: [id number] ?Expiration: 06/2023 ?[place] [id number]? ?Dx: G20 ? B/B ? ?  ? ?Objective:  ?Neurological Exam ? ?Physical Exam ? ?Assessment:  ? ?*** ? ?Plan:  ? ?*** ? ?

## 2022-02-03 NOTE — Patient Instructions (Signed)
Please remember, botulinum toxin takes about 3-7 days to kick in. As discussed, this is not a pain shot. The purpose of the injections is to gradually improve your symptoms. In some patients it takes up to 2-3 weeks to make a difference and it wears off with time. Sometimes it may wear off before it is time for the next injection. We still should wait till the next 3 monthly injection, because injecting too frequently may cause you to develop immunity to the botulinum toxin. We are looking for a reduction in the severity and/or frequency of your symptoms. As a reminder, side effects to look out for are (but not limited to): mouth dryness, dryness of the eyes, heaviness of your head or muscle weakness, including droopy face or droopy eyelid(s), rarely: speech or swallowing difficulties and very rarely: breathing difficulties. Some people have transient neck pain or soreness which typically responds to over-the-counter anti-inflammatory medication and local heat application with a heat pad. If you think you have a severe reaction to the botulinum toxin, such as weakness, trouble speaking, trouble breathing, or trouble swallowing, you have to call 911 or have someone take you to the nearest emergency room. However, most people have either no or minimal side effects from the injections. It is normal to have a little bit of redness and swelling around the injection sites which usually improves after a few hours. Rarely, there may be a bruise that improves on its own. Most side effects reported are very mild and resolve within 10-14 days. Please feel free to call us if you have any additional questions or concerns: [phone] or email us through My Chart. We may have to adjust the dose over time, depending on your results from this injection and your overall response over time to this medication.   

## 2022-02-03 NOTE — Progress Notes (Signed)
Subjective:  ?  ?Patient ID: Scott Wang is a 81 y.o. male. ? ?HPI ? ? ?Mr. Scott Wang is an 81 year old right-handed gentleman with an underlying medical history of hypertension, hyperlipidemia, prostate cancer, spinal stenosis, diabetes, and Parkinson's disease, associated with memory loss, orthostatic hypotension, and sialorrhea who presents for initial botulinum toxin injections for intractable sialorrhea.  The patient is accompanied by his wife today.  He is referred by my colleague Dr. Jaynee Eagles and was last seen by Dr. Jaynee Eagles on 11/20/2021.  ? ?Today, 02/03/2022: He reports significant sialorrhea, wife reports that sometimes he is not even aware of it and his saliva convert onto his clothes.  ?  ?Written informed consent for recurrent, 3 monthly intramuscular injections with botulinum toxin for this indication was obtained on 02/03/2022 and will be scanned into the patient's electronic chart. I will re-consent if the type of botulinum toxin used or the indication for injection changes for this patient in the future. The patient is informed that we will use the same consent for His recurrent, most likely 3 monthly injections. He demonstrated understanding and voiced agreement. ? ?I talked to the patient in detail about expectations, limitations, benefits as well as potential adverse effects of botulinum toxin injections. The patient understands that the side effects include (but are not limited to): Mouth dryness, dryness of eyes, speech and swallowing difficulties, respiratory depression or problems breathing, weakness of muscles including more distant muscles than the ones injected, flu-like symptoms, myalgias, injection site reactions such as redness, itching, swelling, pain, and infection.  Distant effect of toxin is explained. ?  ?100 units of botulinum toxin type A in the form of Botox were reconstituted using preservative-free normal saline to a concentration of 10 units per 0.1 mL and drawn up into 1 mL  tuberculin syringes. Lot number and expiration date as below.   ?  ?The patient was situated in a chair, sitting comfortably. After preparing the areas with 70% isopropyl alcohol and using a 30 gauge 1/2 inch needle for the facial injections, a total dose of 50 units of botulinum toxin type A in the form of Botox was injected into the muscles and the following distribution and quantities: ?  ?#1:  25 units in the right parotid gland, 25 units in the left parotid gland.   ?  ?EMG guidance was not utilized for these injections.  A total dose of 50 units out of 100 units were discarded as unavoidable waste.   ?  ?The patient tolerated the procedure well without immediate complications. He was advised to make a followup appointment for repeat injections in 3 months from now and encouraged to call us with any interim questions, concerns, problems, or updates. He was in agreement and did not have any questions prior to leaving clinic today. ?  ?Dx: K11.7 ?B/B ?  ? ?Botox- 100 units x 1 vials ?SHF:[id number] ?Expiration: 01/2024 ?[place]: [phone] ? ?  ? ?Bacteriostatic 0.9% Sodium Chloride- 1.16m total ?Lot: [id number] ?Ex[place]: 06/2023 ?[place] [id number]? ? ?

## 2022-02-11 ENCOUNTER — Other Ambulatory Visit (HOSPITAL_COMMUNITY): Payer: Self-pay | Admitting: Family Medicine

## 2022-02-11 ENCOUNTER — Ambulatory Visit (HOSPITAL_COMMUNITY)
Admission: RE | Admit: 2022-02-11 | Discharge: 2022-02-11 | Disposition: A | Discharge status: Home / Self Care | Payer: Medicare PPO | Source: Ambulatory Visit | Attending: Cardiovascular Disease | Admitting: Cardiovascular Disease

## 2022-02-11 DIAGNOSIS — R2242 Localized swelling, mass and lump, left lower limb: Secondary | ICD-10-CM | POA: Diagnosis not present

## 2022-02-11 DIAGNOSIS — T300 Burn of unspecified body region, unspecified degree: Secondary | ICD-10-CM | POA: Diagnosis not present

## 2022-02-11 DIAGNOSIS — M7989 Other specified soft tissue disorders: Secondary | ICD-10-CM | POA: Diagnosis not present

## 2022-02-18 ENCOUNTER — Encounter (HOSPITAL_BASED_OUTPATIENT_CLINIC_OR_DEPARTMENT_OTHER): Payer: Medicare PPO | Attending: Internal Medicine | Admitting: Internal Medicine

## 2022-02-18 DIAGNOSIS — X088XXA Exposure to other specified smoke, fire and flames, initial encounter: Secondary | ICD-10-CM | POA: Insufficient documentation

## 2022-02-18 DIAGNOSIS — T24232A Burn of second degree of left lower leg, initial encounter: Secondary | ICD-10-CM | POA: Insufficient documentation

## 2022-02-18 DIAGNOSIS — E119 Type 2 diabetes mellitus without complications: Secondary | ICD-10-CM | POA: Diagnosis not present

## 2022-02-21 NOTE — Progress Notes (Signed)
Nass, Spur. ([id number]) ?Visit Report for 02/18/2022 ?Abuse Risk Screen Details ?Patient Name: Date of Service: ?[name], [name] 02/18/2022 9:00 A M ?Medical Record Number: [id number] ?Patient Account Number: [id number] ?Date of Birth/Sex: Treating RN: ?[birth year] (81 y.o. Scott Wang, Scott Wang ?Primary Care Adan Beal: Sueanne Margarita Other Clinician: ?Referring Treveon Bourcier: ?Treating Bryna Razavi/Extender: Linton Ham ?Sueanne Margarita ?Weeks in Treatment: 0 ?Abuse Risk Screen Items ?Answer ?ABUSE RISK SCREEN: ?Has anyone close to you tried to hurt or harm you recentlyo No ?Do you feel uncomfortable with anyone in your familyo No ?Has anyone forced you do things that you didnt want to doo No ?Electronic Signature(s) ?Signed: 02/21/2022 12:54:41 PM By: Rhae Hammock RN ?Entered By: Rhae Hammock on 02/18/2022 09:11:24 ?-------------------------------------------------------------------------------- ?Activities of Daily Living Details ?Patient Name: Date of Service: ?[name], [name] 02/18/2022 9:00 A M ?Medical Record Number: [id number] ?Patient Account Number: [id number] ?Date of Birth/Sex: Treating RN: ?Mar 25, 1941 (82 y.o. Scott Wang, Scott Wang ?Primary Care Alyssamae Klinck: Sueanne Margarita Other Clinician: ?Referring Dariel Pellecchia: ?Treating Paras Kreider/Extender: Linton Ham ?Sueanne Margarita ?Weeks in Treatment: 0 ?Activities of Daily Living Items ?Answer ?Activities of Daily Living (Please select one for each item) ?[place] ?T Medications ?ake Completely Able ?Use T elephone Completely Able ?Care for Appearance Completely Able ?Use T oilet Completely Able ?Bath / Shower Completely Able ?Dress Self Completely Able ?Feed Self Completely Able ?Walk Completely Able ?Get In / Out Bed Completely Able ?Housework Completely Able ?Prepare Meals Completely Able ?Handle Money Completely Able ?Shop for Self Completely Able ?Electronic Signature(s) ?Signed: 02/21/2022 12:54:41 PM By: Rhae Hammock RN ?Entered By:  Rhae Hammock on 02/18/2022 09:11:55 ?-------------------------------------------------------------------------------- ?Education Screening Details ?Patient Name: ?Date of Service: ?[name], [name] 02/18/2022 9:00 A M ?Medical Record Number: [id number] ?Patient Account Number: [id number] ?Date of Birth/Sex: ?Treating RN: ?12-19-1940 (81 y.o. Scott Wang, Scott Wang ?Primary Care Cavan Bearden: Sueanne Margarita ?Other Clinician: ?Referring Cypher Paule: ?Treating Shaylene Paganelli/Extender: Linton Ham ?Sueanne Margarita ?Weeks in Treatment: 0 ?Primary Learner Assessed: Patient ?Learning Preferences/Education Level/Primary Language ?Learning Preference: Explanation, Demonstration, Communication Board, Printed Material ?Highest Education Level: College or Above ?Preferred Language: English ?Cognitive Barrier ?Language Barrier: No ?Translator Needed: No ?Memory Deficit: No ?Emotional Barrier: No ?Cultural/Religious Beliefs Affecting Medical Care: No ?Physical Barrier ?Impaired Vision: Yes Glasses ?Impaired Hearing: Yes Hearing Aid ?Decreased Hand dexterity: No ?Knowledge/Comprehension ?Knowledge Level: High ?Comprehension Level: High ?Ability to understand written instructions: High ?Ability to understand verbal instructions: High ?Motivation ?Anxiety Level: Calm ?Cooperation: Cooperative ?Education Importance: Denies Need ?Interest in Health Problems: Asks Questions ?Perception: Coherent ?Willingness to Engage in Self-Management High ?Activities: ?Readiness to Engage in Self-Management High ?Activities: ?Electronic Signature(s) ?Signed: 02/21/2022 12:54:41 PM By: Rhae Hammock RN ?Entered By: Rhae Hammock on 02/18/2022 09:13:46 ?-------------------------------------------------------------------------------- ?Fall Risk Assessment Details ?Patient Name: ?Date of Service: ?[name], [name] 02/18/2022 9:00 A M ?Medical Record Number: [id number] ?Patient Account Number: [id number] ?Date of Birth/Sex: ?Treating RN: ?03-11-1941 (81 y.o.  Scott Wang, Scott Wang ?Primary Care Omya Winfield: Sueanne Margarita ?Other Clinician: ?Referring Kayti Poss: ?Treating Caulin Begley/Extender: Linton Ham ?Sueanne Margarita ?Weeks in Treatment: 0 ?Fall Risk Assessment Items ?Have you had 2 or more falls in the last 12 monthso 0 No ?Have you had any fall that resulted in injury in the last 12 monthso 0 No ?FALLS RISK SCREEN ?History of falling - immediate or within 3 months 0 No ?Secondary diagnosis (Do you have 2 or more medical diagnoseso) 0 No ?Ambulatory aid ?None/bed rest/wheelchair/nurse 0 No ?Crutches/cane/walker 0 No ?Furniture 0 No ?Intravenous therapy  Access/Saline/Heparin Lock 0 No ?Gait/Transferring ?Normal/ bed rest/ wheelchair 0 No ?Weak (short steps with or without shuffle, stooped but able to lift head while walking, may seek 0 No ?support from furniture) ?Impaired (short steps with shuffle, may have difficulty arising from chair, head down, impaired 0 No ?balance) ?Mental Status ?Oriented to own ability 0 No ?Electronic Signature(s) ?Signed: 02/21/2022 12:54:41 PM By: Rhae Hammock RN ?Entered By: Rhae Hammock on 02/18/2022 09:13:53 ?-------------------------------------------------------------------------------- ?Foot Assessment Details ?Patient Name: ?Date of Service: ?[name], [name] 02/18/2022 9:00 A M ?Medical Record Number: [id number] ?Patient Account Number: [id number] ?Date of Birth/Sex: ?Treating RN: ?Mar 11, 1941 (81 y.o. Scott Wang, Scott Wang ?Primary Care Khriz Liddy: Sueanne Margarita ?Other Clinician: ?Referring Morgin Halls: ?Treating Kalliope Riesen/Extender: Linton Ham ?Sueanne Margarita ?Weeks in Treatment: 0 ?Foot Assessment Items ?Site Locations ?+ = Sensation present, - = Sensation absent, C = Callus, U = Ulcer ?R = Redness, W = Warmth, M = Maceration, PU = Pre-ulcerative lesion ?F = Fissure, S = Swelling, D = Dryness ?Assessment ?Right: Left: ?Other Deformity: No No ?Prior Foot Ulcer: No No ?Prior Amputation: No No ?Charcot Joint: No No ?Ambulatory  Status: Ambulatory Without Help ?Gait: Steady ?Electronic Signature(s) ?Signed: 02/21/2022 12:54:41 PM By: Rhae Hammock RN ?Entered By: Rhae Hammock on 02/18/2022 09:16:17 ?-------------------------------------------------------------------------------- ?Nutrition Risk Screening Details ?Patient Name: ?Date of Service: ?[name], [name] 02/18/2022 9:00 A M ?Medical Record Number: [id number] ?Patient Account Number: [id number] ?Date of Birth/Sex: ?Treating RN: ?[birth year] (81 y.o. Scott Wang, Scott Wang ?Primary Care Annalia Metzger: Sueanne Margarita ?Other Clinician: ?Referring Endi Lagman: ?Treating Angee Gupton/Extender: Linton Ham ?Sueanne Margarita ?Weeks in Treatment: 0 ?Height (in): 68 ?Weight (lbs): 170 ?Body Mass Index (BMI): 25.8 ?Nutrition Risk Screening Items ?Score Screening ?NUTRITION RISK SCREEN: ?I have an illness or condition that made me change the kind and/or amount of food I eat 0 No ?I eat fewer than two meals per day 0 No ?I eat few fruits and vegetables, or milk products 0 No ?I have three or more drinks of beer, liquor or wine almost every day 0 No ?I have tooth or mouth problems that make it hard for me to eat 0 No ?I don't always have enough money to buy the food I need 0 No ?I eat alone most of the time 0 No ?I take three or more different prescribed or over-the-counter drugs a day 0 No ?Without wanting to, I have lost or gained 10 pounds in the last six months 0 No ?I am not always physically able to shop, cook and/or feed myself 0 No ?Nutrition Protocols ?Good Risk Protocol 0 No interventions needed ?Moderate Risk Protocol ?High Risk Proctocol ?Risk Level: Good Risk ?Score: 0 ?Electronic Signature(s) ?Signed: 02/21/2022 12:54:41 PM By: Rhae Hammock RN ?Entered By: Rhae Hammock on 02/18/2022 09:13:59 ?
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
## 2022-02-21 NOTE — Progress Notes (Signed)
[place] [place]. ([id number]) ?Visit Report for 02/18/2022 ?Allergy List Details ?Patient Name: Date of Service: ?Scott Wang, Scott Wang. 02/18/2022 9:00 A M ?Medical Record Number: [id number] ?Patient Account Number: [id number] ?Date of Birth/Sex: Treating RN: ?25-Nov-1940 (81 y.o. M) Rolin Barry, Bobbi ?Primary Care Safa Derner: Sueanne Margarita Other Clinician: ?Referring Upton Russey: ?Treating Ludwig Tugwell/Extender: Linton Ham ?Sueanne Margarita ?Weeks in Treatment: 0 ?Allergies ?Active Allergies ?Sulfa (Sulfonamide Antibiotics) ?Reaction: swelling, rash ?Allergy Notes ?Electronic Signature(s) ?Signed: 02/21/2022 12:54:41 PM By: Rhae Hammock RN ?Entered By: Rhae Hammock on 02/18/2022 09:10:36 ?-------------------------------------------------------------------------------- ?Arrival Information Details ?Patient Name: Date of Service: ?Scott Wang, Scott Wang. 02/18/2022 9:00 A M ?Medical Record Number: [id number] ?Patient Account Number: [id number] ?Date of Birth/Sex: Treating RN: ?[birth year] (81 y.o. Burnadette Pop, Lauren ?Primary Care Nayquan Evinger: Sueanne Margarita Other Clinician: ?Referring Kasiya Burck: ?Treating Jarrah Babich/Extender: Linton Ham ?Sueanne Margarita ?Weeks in Treatment: 0 ?Visit Information ?Patient Arrived: Ambulatory ?Arrival Time: 09:07 ?Accompanied By: wife ?Transfer Assistance: None ?Patient Identification Verified: Yes ?Secondary Verification Process Completed: Yes ?Patient Requires Transmission-Based Precautions: No ?Patient Has Alerts: No ?Electronic Signature(s) ?Signed: 02/21/2022 12:54:41 PM By: Rhae Hammock RN ?Entered By: Rhae Hammock on 02/18/2022 09:08:51 ?-------------------------------------------------------------------------------- ?Clinic Level of Care Assessment Details ?Patient Name: Date of Service: ?Scott Wang, Scott Wang. 02/18/2022 9:00 A M ?Medical Record Number: [id number] ?Patient Account Number: [id number] ?Date of Birth/Sex: Treating RN: ?09/09/1941 (81 y.o. M) Rolin Barry, Bobbi ?Primary Care Navneet Schmuck: Sueanne Margarita Other Clinician: ?Referring Kepler Mccabe: ?Treating Izac Faulkenberry/Extender: Linton Ham ?Sueanne Margarita ?Weeks in Treatment: 0 ?Clinic Level of Care Assessment Items ?TOOL 1 Quantity Score ?X- 1 0 ?Use when EandM and Procedure is performed on INITIAL visit ?ASSESSMENTS - Nursing Assessment / Reassessment ?X- 1 20 ?General Physical Exam (combine w/ comprehensive assessment (listed just below) when performed on new pt. evals) ?X- 1 25 ?Comprehensive Assessment (HX, ROS, Risk Assessments, Wounds Hx, etc.) ?ASSESSMENTS - Wound and Skin Assessment / Reassessment ?X- 1 10 ?Dermatologic / Skin Assessment (not related to wound area) ?ASSESSMENTS - Ostomy and/or Continence Assessment and Care ?'[]'$  - 0 ?Incontinence Assessment and Management ?'[]'$  - 0 ?Ostomy Care Assessment and Management (repouching, etc.) ?PROCESS - Coordination of Care ?'[]'$  - 0 ?Simple Patient / Family Education for ongoing care ?X- 1 20 ?Complex (extensive) Patient / Family Education for ongoing care ?X- 1 10 ?Staff obtains Consents, Records, T Results / Process Orders ?est ?X- 1 10 ?Staff telephones HHA, Nursing Homes / Clarify orders / etc ?'[]'$  - 0 ?Routine Transfer to another Facility (non-emergent condition) ?'[]'$  - 0 ?Routine Hospital Admission (non-emergent condition) ?X- 1 15 ?New Admissions / Biomedical engineer / Ordering NPWT Apligraf, etc. ?, ?'[]'$  - 0 ?Emergency Hospital Admission (emergent condition) ?PROCESS - Special Needs ?'[]'$  - 0 ?Pediatric / Minor Patient Management ?'[]'$  - 0 ?Isolation Patient Management ?'[]'$  - 0 ?Hearing / Language / Visual special needs ?'[]'$  - 0 ?Assessment of Community assistance (transportation, D/C planning, etc.) ?'[]'$  - 0 ?Additional assistance / Altered mentation ?'[]'$  - 0 ?Support Surface(s) Assessment (bed, cushion, seat, etc.) ?INTERVENTIONS - Miscellaneous ?'[]'$  - 0 ?External ear exam ?'[]'$  - 0 ?Patient Transfer (multiple staff / Civil Service fast streamer / Similar devices) ?'[]'$  - 0 ?Simple Staple / Suture removal (25 or less) ?'[]'$  -  0 ?Complex Staple / Suture removal (26 or more) ?'[]'$  - 0 ?Hypo/Hyperglycemic Management (do not check if billed separately) ?X- 1 15 ?Ankle / Brachial Index (ABI) - do not check if billed separately ?Has the patient been seen at the hospital within the last three years: Yes ?  Total Score: 125 ?Level Of Care: New/Established - Level 4 ?Electronic Signature(s) ?Signed: 02/18/2022 4:48:28 PM By: Deon Pilling RN, BSN ?Entered By: Deon Pilling on 02/18/2022 09:44:01 ?-------------------------------------------------------------------------------- ?Encounter Discharge Information Details ?Patient Name: ?Date of Service: ?Scott Wang, Scott Wang. 02/18/2022 9:00 A M ?Medical Record Number: [id number] ?Patient Account Number: [id number] ?Date of Birth/Sex: ?Treating RN: ?10-14-1941 (81 y.o. M) Rolin Barry, Bobbi ?Primary Care Quanasia Defino: Sueanne Margarita ?Other Clinician: ?Referring Lynix Bonine: ?Treating Keiara Sneeringer/Extender: Linton Ham ?Sueanne Margarita ?Weeks in Treatment: 0 ?Encounter Discharge Information Items ?Discharge Condition: Stable ?Ambulatory Status: Kasandra Knudsen ?Discharge Destination: Home ?Transportation: Private Auto ?Accompanied By: wife ?Schedule Follow-up Appointment: Yes ?Clinical Summary of Care: ?Electronic Signature(s) ?Signed: 02/18/2022 4:48:28 PM By: Deon Pilling RN, BSN ?Entered By: Deon Pilling on 02/18/2022 09:46:47 ?-------------------------------------------------------------------------------- ?Lower Extremity Assessment Details ?Patient Name: ?Date of Service: ?Scott Wang, Scott Wang. 02/18/2022 9:00 A M ?Medical Record Number: [id number] ?Patient Account Number: [id number] ?Date of Birth/Sex: ?Treating RN: ?[birth year] (81 y.o. Burnadette Pop, Lauren ?Primary Care Granvil Djordjevic: Sueanne Margarita ?Other Clinician: ?Referring Kolten Ryback: ?Treating Noha Karasik/Extender: Linton Ham ?Sueanne Margarita ?Weeks in Treatment: 0 ?Edema Assessment ?Assessed: [Left: Yes] [Right: No] ?Edema: [Left: Ye] [Right: s] ?Calf ?Left: Right: ?Point of  Measurement: 35 cm From Medial Instep 37 cm ?Ankle ?Left: Right: ?Point of Measurement: 9 cm From Medial Instep 22 cm ?Knee To Floor ?Left: Right: ?From Medial Instep 40 cm ?Vascular Assessment ?Pulses: ?Dorsalis Pedis ?Palpable: [Left:Yes] ?Posterior Tibial ?Palpable: [Left:Yes] ?Blood Pressure: ?Brachial: [Left:135] ?Ankle: ?[Left:Dorsalis Pedis: 163] ?Ankle Brachial Index: [Left:1.19] ?Electronic Signature(s) ?Signed: 02/21/2022 12:54:41 PM By: Rhae Hammock RN ?Entered By: Rhae Hammock on 02/18/2022 09:25:16 ?-------------------------------------------------------------------------------- ?Multi Wound Chart Details ?Patient Name: ?Date of Service: ?Scott Wang, Scott Wang. 02/18/2022 9:00 A M ?Medical Record Number: [id number] ?Patient Account Number: [id number] ?Date of Birth/Sex: ?Treating RN: ?05/24/1941 (81 y.o. M) Rolin Barry, Bobbi ?Primary Care Alda Gaultney: Sueanne Margarita ?Other Clinician: ?Referring Albaraa Swingle: ?Treating Griffon Herberg/Extender: Linton Ham ?Sueanne Margarita ?Weeks in Treatment: 0 ?Vital Signs ?Height(in): 68 ?Pulse(bpm): 69 ?Weight(lbs): 170 ?Blood Pressure(mmHg): 135/81 ?Body Mass Index(BMI): 25.8 ?Temperature(??F): 97.7 ?Respiratory Rate(breaths/min): 17 ?Photos: [N/A:N/A] ?Left, Lateral Lower Leg N/A N/A ?Wound Location: ?Thermal Burn N/A N/A ?Wounding Event: ?Diabetic Wound/Ulcer of the Lower N/A N/A ?Primary Etiology: ?Extremity ?Hypertension, Type II Diabetes, N/A N/A ?Comorbid History: ?Received Radiation ?01/24/2022 N/A N/A ?Date Acquired: ?0 N/A N/A ?Weeks of Treatment: ?Open N/A N/A ?Wound Status: ?No N/A N/A ?Wound Recurrence: ?10x8.5x0.1 N/A N/A ?Measurements L x W x D (cm) ?66.759 N/A N/A ?A (cm?) : ?rea ?6.676 N/A N/A ?Volume (cm?) : ?0.00% N/A N/A ?% Reduction in A rea: ?0.00% N/A N/A ?% Reduction in Volume: ?Grade 2 N/A N/A ?Classification: ?Medium N/A N/A ?Exudate A mount: ?Serosanguineous N/A N/A ?Exudate Type: ?red, brown N/A N/A ?Exudate Color: ?Distinct, outline attached N/A  N/A ?Wound Margin: ?Small (1-33%) N/A N/A ?Granulation A mount: ?Red, Pink N/A N/A ?Granulation Quality: ?Large (67-100%) N/A N/A ?Necrotic A mount: ?Eschar, Adherent Slough N/A N/A ?Necrotic Tissue: ?Fat Layer (Subcutaneou

## 2022-02-24 ENCOUNTER — Encounter (HOSPITAL_BASED_OUTPATIENT_CLINIC_OR_DEPARTMENT_OTHER): Payer: Medicare PPO | Admitting: Internal Medicine

## 2022-02-24 DIAGNOSIS — E119 Type 2 diabetes mellitus without complications: Secondary | ICD-10-CM | POA: Diagnosis not present

## 2022-02-24 DIAGNOSIS — L97822 Non-pressure chronic ulcer of other part of left lower leg with fat layer exposed: Secondary | ICD-10-CM | POA: Diagnosis not present

## 2022-02-24 DIAGNOSIS — T24232A Burn of second degree of left lower leg, initial encounter: Secondary | ICD-10-CM | POA: Diagnosis not present

## 2022-02-24 DIAGNOSIS — E11622 Type 2 diabetes mellitus with other skin ulcer: Secondary | ICD-10-CM | POA: Diagnosis not present

## 2022-02-24 NOTE — Progress Notes (Signed)
[place], [place]. ([id number]) ?Visit Report for 02/24/2022 ?Arrival Information Details ?Patient Name: Date of Service: ?[name], [name] 02/24/2022 8:15 A M ?Medical Record Number: [id number] ?Patient Account Number: [id number] ?Date of Birth/Sex: Treating RN: ?[birth year] (81 y.o. M) Scott Wang, Scott Wang ?Primary Care Tabatha Razzano: Sueanne Margarita Other Clinician: ?Referring Rayette Mogg: ?Treating Markelle Najarian/Extender: Linton Ham ?Sueanne Margarita ?Weeks in Treatment: 0 ?Visit Information History Since Last Visit ?Added or deleted any medications: No ?Patient Arrived: Ambulatory ?Any new allergies or adverse reactions: No ?Arrival Time: 08:14 ?Had a fall or experienced change in No ?Accompanied By: wife ?activities of daily living that may affect ?Transfer Assistance: None ?risk of falls: ?Patient Identification Verified: Yes ?Signs or symptoms of abuse/neglect since last visito No ?Secondary Verification Process Completed: Yes ?Hospitalized since last visit: No ?Patient Requires Transmission-Based Precautions: No ?Implantable device outside of the clinic excluding No ?Patient Has Alerts: No ?cellular tissue based products placed in the center ?since last visit: ?Has Dressing in Place as Prescribed: Yes ?Has Compression in Place as Prescribed: Yes ?Pain Present Now: Yes ?Electronic Signature(s) ?Signed: 02/24/2022 4:07:13 PM By: Deon Pilling RN, BSN ?Entered By: Deon Pilling on 02/24/2022 08:14:40 ?-------------------------------------------------------------------------------- ?Encounter Discharge Information Details ?Patient Name: Date of Service: ?[name], [name] 02/24/2022 8:15 A M ?Medical Record Number: [id number] ?Patient Account Number: [id number] ?Date of Birth/Sex: Treating RN: ?1941/06/07 (81 y.o. M) Scott Wang, Scott Wang ?Primary Care Starsky Nanna: Sueanne Margarita Other Clinician: ?Referring Naveya Ellerman: ?Treating Lazarus Sudbury/Extender: Linton Ham ?Sueanne Margarita ?Weeks in Treatment: 0 ?Encounter Discharge Information Items ?Discharge  Condition: Stable ?Ambulatory Status: Ambulatory ?Discharge Destination: Home ?Transportation: Other ?Accompanied By: wife ?Schedule Follow-up Appointment: Yes ?Clinical Summary of Care: ?Electronic Signature(s) ?Signed: 02/24/2022 4:07:13 PM By: Deon Pilling RN, BSN ?Entered By: Deon Pilling on 02/24/2022 08:32:39 ?-------------------------------------------------------------------------------- ?Lower Extremity Assessment Details ?Patient Name: ?Date of Service: ?[name], [name] 02/24/2022 8:15 A M ?Medical Record Number: [id number] ?Patient Account Number: [id number] ?Date of Birth/Sex: ?Treating RN: ?1941/11/09 (81 y.o. M) Scott Wang, Scott Wang ?Primary Care Rion Schnitzer: Sueanne Margarita ?Other Clinician: ?Referring Kiana Hollar: ?Treating Dnya Hickle/Extender: Linton Ham ?Sueanne Margarita ?Weeks in Treatment: 0 ?Edema Assessment ?Assessed: [Left: Yes] [Right: No] ?Edema: [Left: N] [Right: o] ?Calf ?Left: Right: ?Point of Measurement: 35 cm From Medial Instep 36 cm ?Ankle ?Left: Right: ?Point of Measurement: 9 cm From Medial Instep 21 cm ?Vascular Assessment ?Pulses: ?Dorsalis Pedis ?Palpable: [Left:Yes] ?Electronic Signature(s) ?Signed: 02/24/2022 4:07:13 PM By: Deon Pilling RN, BSN ?Entered By: Deon Pilling on 02/24/2022 08:17:00 ?-------------------------------------------------------------------------------- ?Multi Wound Chart Details ?Patient Name: ?Date of Service: ?[name], [name] 02/24/2022 8:15 A M ?Medical Record Number: [id number] ?Patient Account Number: [id number] ?Date of Birth/Sex: ?Treating RN: ?1941-08-15 (81 y.o. M) Scott Wang, Scott Wang ?Primary Care Sonjia Wilcoxson: Sueanne Margarita ?Other Clinician: ?Referring Herndon Grill: ?Treating Manpreet Strey/Extender: Linton Ham ?Sueanne Margarita ?Weeks in Treatment: 0 ?Vital Signs ?Height(in): 68 ?Pulse(bpm): 71 ?Weight(lbs): 170 ?Blood Pressure(mmHg): 142/72 ?Body Mass Index(BMI): 25.8 ?Temperature(??F): 98.8 ?Respiratory Rate(breaths/min): 20 ?Photos: [N/A:N/A] ?Left, Lateral Lower Leg N/A  N/A ?Wound Location: ?Thermal Burn N/A N/A ?Wounding Event: ?Diabetic Wound/Ulcer of the Lower N/A N/A ?Primary Etiology: ?Extremity ?Hypertension, Type II Diabetes, N/A N/A ?Comorbid History: ?Received Radiation ?01/24/2022 N/A N/A ?Date Acquired: ?0 N/A N/A ?Weeks of Treatment: ?Open N/A N/A ?Wound Status: ?No N/A N/A ?Wound Recurrence: ?6x6.8x0.1 N/A N/A ?Measurements L x W x D (cm) ?32.044 N/A N/A ?A (cm?) : ?rea ?3.204 N/A N/A ?Volume (cm?) : ?52.00% N/A N/A ?% Reduction in A rea: ?52.00% N/A N/A ?% Reduction in Volume: ?Grade 2  N/A N/A ?Classification: ?Medium N/A N/A ?Exudate A mount: ?Serosanguineous N/A N/A ?Exudate Type: ?red, brown N/A N/A ?Exudate Color: ?Distinct, outline attached N/A N/A ?Wound Margin: ?Medium (34-66%) N/A N/A ?Granulation A mount: ?Red, Pink N/A N/A ?Granulation Quality: ?Medium (34-66%) N/A N/A ?Necrotic A mount: ?Fat Layer (Subcutaneous Tissue): Yes N/A N/A ?Exposed Structures: ?Fascia: No ?Tendon: No ?Muscle: No ?Joint: No ?Bone: No ?Small (1-33%) N/A N/A ?Epithelialization: ?Dressings and/or debridement of N/A N/A ?Procedures Performed: ?burns; medium ?Treatment Notes ?Wound #1 (Lower Leg) Wound Laterality: Left, Lateral ?Cleanser ?Soap and Water ?Discharge Instruction: May shower and wash wound with dial antibacterial soap and water prior to dressing change. ?Wound Cleanser ?Discharge Instruction: Cleanse the wound with wound cleanser prior to applying a clean dressing using gauze sponges, not tissue or cotton balls. ?Peri-Wound Care ?Sween Lotion (Moisturizing lotion) ?Discharge Instruction: Apply moisturizing lotion as directed ?Topical ?Primary Dressing ?Iodosorb Gel 10 (gm) Tube ?Discharge Instruction: Apply iodosorb or iodoflex to wound bed as instructed ?Secondary Dressing ?ABD Pad, 8x10 ?Discharge Instruction: Apply over primary dressing as directed. ?Woven Gauze Sponge, Non-Sterile 4x4 in ?Discharge Instruction: Apply over primary dressing as directed. ?Secured  With ?Compression Wrap ?Kerlix Roll 4.5x3.1 (in/yd) ?Discharge Instruction: Apply Kerlix and Coban compression as directed. ?Coban Self-Adherent Wrap 4x5 (in/yd) ?Discharge Instruction: Apply over Kerlix as directed. ?Compression Stockings ?Add-Ons ?Electronic Signature(s) ?Signed: 02/24/2022 3:52:38 PM By: Linton Ham MD ?Signed: 02/24/2022 4:07:13 PM By: Deon Pilling RN, BSN ?Entered By: Linton Ham on 02/24/2022 08:43:57 ?-------------------------------------------------------------------------------- ?Multi-Disciplinary Care Plan Details ?Patient Name: ?Date of Service: ?[name], [name] 02/24/2022 8:15 A M ?Medical Record Number: [id number] ?Patient Account Number: [id number] ?Date of Birth/Sex: ?Treating RN: ?January 19, 1941 (81 y.o. M) Scott Wang, Scott Wang ?Primary Care Brynnley Dayrit: Sueanne Margarita ?Other Clinician: ?Referring Ever Halberg: ?Treating Zharia Conrow/Extender: Linton Ham ?Sueanne Margarita ?Weeks in Treatment: 0 ?Active Inactive ?Necrotic Tissue ?Nursing Diagnoses: ?Impaired tissue integrity related to necrotic/devitalized tissue ?Knowledge deficit related to management of necrotic/devitalized tissue ?Goals: ?Necrotic/devitalized tissue will be minimized in the wound bed ?Date Initiated: 02/18/2022 ?Target Resolution Date: 03/14/2022 ?Goal Status: Active ?Patient/caregiver will verbalize understanding of reason and process for debridement of necrotic tissue ?Date Initiated: 02/18/2022 ?Target Resolution Date: 03/14/2022 ?Goal Status: Active ?Interventions: ?Assess patient pain level pre-, during and post procedure and prior to discharge ?Provide education on necrotic tissue and debridement process ?Treatment Activities: ?Apply topical anesthetic as ordered : 02/18/2022 ?Notes: ?Wound/Skin Impairment ?Nursing Diagnoses: ?Knowledge deficit related to ulceration/compromised skin integrity ?Goals: ?Patient/caregiver will verbalize understanding of skin care regimen ?Date Initiated: 02/18/2022 ?Target Resolution Date:  03/14/2022 ?Goal Status: Active ?Interventions: ?Assess patient/caregiver ability to perform ulcer/skin care regimen upon admission and as needed ?Assess ulceration(s) every visit ?Provide education on ulcer and skin care ?Trea
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
## 2022-02-28 DIAGNOSIS — N1832 Chronic kidney disease, stage 3b: Secondary | ICD-10-CM | POA: Diagnosis not present

## 2022-02-28 DIAGNOSIS — I10 Essential (primary) hypertension: Secondary | ICD-10-CM | POA: Diagnosis not present

## 2022-02-28 DIAGNOSIS — G20 Parkinson's disease: Secondary | ICD-10-CM | POA: Diagnosis not present

## 2022-02-28 DIAGNOSIS — I700 Atherosclerosis of aorta: Secondary | ICD-10-CM | POA: Diagnosis not present

## 2022-02-28 DIAGNOSIS — E785 Hyperlipidemia, unspecified: Secondary | ICD-10-CM | POA: Diagnosis not present

## 2022-02-28 DIAGNOSIS — J8410 Pulmonary fibrosis, unspecified: Secondary | ICD-10-CM | POA: Diagnosis not present

## 2022-02-28 DIAGNOSIS — E1169 Type 2 diabetes mellitus with other specified complication: Secondary | ICD-10-CM | POA: Diagnosis not present

## 2022-02-28 DIAGNOSIS — J439 Emphysema, unspecified: Secondary | ICD-10-CM | POA: Diagnosis not present

## 2022-02-28 DIAGNOSIS — J849 Interstitial pulmonary disease, unspecified: Secondary | ICD-10-CM | POA: Diagnosis not present

## 2022-03-03 ENCOUNTER — Encounter (HOSPITAL_BASED_OUTPATIENT_CLINIC_OR_DEPARTMENT_OTHER): Payer: Medicare PPO | Admitting: Internal Medicine

## 2022-03-03 DIAGNOSIS — T24232D Burn of second degree of left lower leg, subsequent encounter: Secondary | ICD-10-CM | POA: Diagnosis not present

## 2022-03-03 DIAGNOSIS — T24232A Burn of second degree of left lower leg, initial encounter: Secondary | ICD-10-CM | POA: Diagnosis not present

## 2022-03-03 DIAGNOSIS — E11622 Type 2 diabetes mellitus with other skin ulcer: Secondary | ICD-10-CM

## 2022-03-03 DIAGNOSIS — E119 Type 2 diabetes mellitus without complications: Secondary | ICD-10-CM | POA: Diagnosis not present

## 2022-03-10 ENCOUNTER — Encounter (HOSPITAL_BASED_OUTPATIENT_CLINIC_OR_DEPARTMENT_OTHER): Payer: Medicare PPO | Attending: Internal Medicine | Admitting: Internal Medicine

## 2022-03-10 DIAGNOSIS — T25232D Burn of second degree of left toe(s) (nail), subsequent encounter: Secondary | ICD-10-CM | POA: Diagnosis not present

## 2022-03-10 DIAGNOSIS — T24232D Burn of second degree of left lower leg, subsequent encounter: Secondary | ICD-10-CM

## 2022-03-10 DIAGNOSIS — G20 Parkinson's disease: Secondary | ICD-10-CM | POA: Diagnosis not present

## 2022-03-10 DIAGNOSIS — E11622 Type 2 diabetes mellitus with other skin ulcer: Secondary | ICD-10-CM | POA: Diagnosis not present

## 2022-03-10 DIAGNOSIS — Z87891 Personal history of nicotine dependence: Secondary | ICD-10-CM | POA: Diagnosis not present

## 2022-03-10 DIAGNOSIS — X58XXXD Exposure to other specified factors, subsequent encounter: Secondary | ICD-10-CM | POA: Diagnosis not present
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
## 2022-03-10 NOTE — Progress Notes (Signed)
[place], [place]. ([id number]) ?Visit Report for 03/10/2022 ?Arrival Information Details ?Patient Name: Date of Service: ?[name], [name] 03/10/2022 9:00 A M ?Medical Record Number: [id number] ?Patient Account Number: [id number] ?Date of Birth/Sex: Treating RN: ?1940/12/26 (81 y.o. Male) Deon Pilling ?Primary Care Giovannie Scerbo: Sueanne Margarita Other Clinician: ?Referring Rafel Garde: ?Treating Braysen Cloward/Extender: Kalman Shan ?Sueanne Margarita ?Weeks in Treatment: 2 ?Visit Information History Since Last Visit ?Added or deleted any medications: No ?Patient Arrived: Ambulatory ?Any new allergies or adverse reactions: No ?Arrival Time: 09:13 ?Had a fall or experienced change in No ?Accompanied By: wife ?activities of daily living that may affect ?Transfer Assistance: None ?risk of falls: ?Patient Identification Verified: Yes ?Signs or symptoms of abuse/neglect since last visito No ?Secondary Verification Process Completed: Yes ?Hospitalized since last visit: No ?Patient Requires Transmission-Based Precautions: No ?Implantable device outside of the clinic excluding No ?Patient Has Alerts: No ?cellular tissue based products placed in the center ?since last visit: ?Has Dressing in Place as Prescribed: Yes ?Has Compression in Place as Prescribed: Yes ?Pain Present Now: Yes ?Electronic Signature(s) ?Signed: 03/10/2022 6:01:58 PM By: Deon Pilling RN, BSN ?Entered By: Deon Pilling on 03/10/2022 09:13:56 ?-------------------------------------------------------------------------------- ?Encounter Discharge Information Details ?Patient Name: Date of Service: ?[name], [name] 03/10/2022 9:00 A M ?Medical Record Number: [id number] ?Patient Account Number: [id number] ?Date of Birth/Sex: Treating RN: ?[birth year] (81 y.o. Male) Deon Pilling ?Primary Care Ouita Nish: Sueanne Margarita Other Clinician: ?Referring Dorlisa Savino: ?Treating Deyanira Fesler/Extender: Kalman Shan ?Sueanne Margarita ?Weeks in Treatment: 2 ?Encounter Discharge Information Items ?Discharge  Condition: Stable ?Ambulatory Status: Ambulatory ?Discharge Destination: Home ?Transportation: Private Auto ?Accompanied By: wife ?Schedule Follow-up Appointment: Yes ?Clinical Summary of Care: ?Electronic Signature(s) ?Signed: 03/10/2022 6:01:58 PM By: Deon Pilling RN, BSN ?Entered By: Deon Pilling on 03/10/2022 10:01:24 ?-------------------------------------------------------------------------------- ?Lower Extremity Assessment Details ?Patient Name: ?Date of Service: ?[name], [name] 03/10/2022 9:00 A M ?Medical Record Number: [id number] ?Patient Account Number: [id number] ?Date of Birth/Sex: ?Treating RN: ?Sep 04, 1941 (81 y.o. Male) Deon Pilling ?Primary Care Linh Hedberg: Sueanne Margarita ?Other Clinician: ?Referring Nasier Thumm: ?Treating Keegan Ducey/Extender: Kalman Shan ?Sueanne Margarita ?Weeks in Treatment: 2 ?Edema Assessment ?Assessed: [Left: Yes] [Right: No] ?Edema: [Left: N] [Right: o] ?Calf ?Left: Right: ?Point of Measurement: 35 cm From Medial Instep 34 cm ?Ankle ?Left: Right: ?Point of Measurement: 9 cm From Medial Instep 20.5 cm ?Vascular Assessment ?Pulses: ?Dorsalis Pedis ?Palpable: [Left:Yes] ?Electronic Signature(s) ?Signed: 03/10/2022 6:01:58 PM By: Deon Pilling RN, BSN ?Entered By: Deon Pilling on 03/10/2022 09:16:32 ?-------------------------------------------------------------------------------- ?Multi Wound Chart Details ?Patient Name: ?Date of Service: ?[name], [name] 03/10/2022 9:00 A M ?Medical Record Number: [id number] ?Patient Account Number: [id number] ?Date of Birth/Sex: ?Treating RN: ?[birth year] (81 y.o. Male) Deon Pilling ?Primary Care Ugo Thoma: Sueanne Margarita ?Other Clinician: ?Referring Shizue Kaseman: ?Treating Tyreke Kaeser/Extender: Kalman Shan ?Sueanne Margarita ?Weeks in Treatment: 2 ?Vital Signs ?Height(in): 68 ?Pulse(bpm): 87 ?Weight(lbs): 170 ?Blood Pressure(mmHg): 134/63 ?Body Mass Index(BMI): 25.8 ?Temperature(??F): 97.4 ?Respiratory Rate(breaths/min): 20 ?Photos: [N/A:N/A] ?Left, Lateral  Lower Leg N/A N/A ?Wound Location: ?Thermal Burn N/A N/A ?Wounding Event: ?Diabetic Wound/Ulcer of the Lower N/A N/A ?Primary Etiology: ?Extremity ?Hypertension, Type II Diabetes, N/A N/A ?Comorbid History: ?Received Radiation ?01/24/2022 N/A N/A ?Date Acquired: ?2 N/A N/A ?Weeks of Treatment: ?Open N/A N/A ?Wound Status: ?No N/A N/A ?Wound Recurrence: ?5.9x6.9x0.1 N/A N/A ?Measurements L x W x D (cm) ?31.974 N/A N/A ?A (cm?) : ?rea ?3.197 N/A N/A ?Volume (cm?) : ?52.10% N/A N/A ?% Reduction in A rea: ?52.10% N/A N/A ?% Reduction in Volume: ?Grade  2 N/A N/A ?Classification: ?Medium N/A N/A ?Exudate A mount: ?Serosanguineous N/A N/A ?Exudate Type: ?red, brown N/A N/A ?Exudate Color: ?Distinct, outline attached N/A N/A ?Wound Margin: ?Large (67-100%) N/A N/A ?Granulation A mount: ?Red, Pink, Friable N/A N/A ?Granulation Quality: ?None Present (0%) N/A N/A ?Necrotic A mount: ?Fat Layer (Subcutaneous Tissue): Yes N/A N/A ?Exposed Structures: ?Fascia: No ?Tendon: No ?Muscle: No ?Joint: No ?Bone: No ?Small (1-33%) N/A N/A ?Epithelialization: ?Dressings and/or debridement of N/A N/A ?Procedures Performed: ?burns; medium ?Treatment Notes ?Wound #1 (Lower Leg) Wound Laterality: Left, Lateral ?Cleanser ?Soap and Water ?Discharge Instruction: May shower and wash wound with dial antibacterial soap and water prior to dressing change. ?Wound Cleanser ?Discharge Instruction: Cleanse the wound with wound cleanser prior to applying a clean dressing using gauze sponges, not tissue or cotton balls. ?Peri-Wound Care ?Sween Lotion (Moisturizing lotion) ?Discharge Instruction: Apply moisturizing lotion as directed ?Topical ?Primary Dressing ?Hydrofera Blue Ready Foam, 4x5 in ?Discharge Instruction: Apply to wound bed as instructed ?Secondary Dressing ?ABD Pad, 8x10 ?Discharge Instruction: Apply over primary dressing as directed. ?Woven Gauze Sponge, Non-Sterile 4x4 in ?Discharge Instruction: Apply over primary dressing as  directed. ?Secured With ?Compression Wrap ?Kerlix Roll 4.5x3.1 (in/yd) ?Discharge Instruction: Apply Kerlix and Coban compression as directed. ?Coban Self-Adherent Wrap 4x5 (in/yd) ?Discharge Instruction: Apply over Kerlix as directed. ?Compression Stockings ?Add-Ons ?Electronic Signature(s) ?Signed: 03/10/2022 10:49:03 AM By: Kalman Shan DO ?Signed: 03/10/2022 6:01:58 PM By: Deon Pilling RN, BSN ?Entered By: Kalman Shan on 03/10/2022 10:44:29 ?-------------------------------------------------------------------------------- ?Multi-Disciplinary Care Plan Details ?Patient Name: ?Date of Service: ?[name], [name] 03/10/2022 9:00 A M ?Medical Record Number: [id number] ?Patient Account Number: [id number] ?Date of Birth/Sex: ?Treating RN: ?[birth year] (80 y.o. Male) Deon Pilling ?Primary Care Gustavia Carie: Sueanne Margarita ?Other Clinician: ?Referring Luise Yamamoto: ?Treating Jerrico Covello/Extender: Kalman Shan ?Sueanne Margarita ?Weeks in Treatment: 2 ?Active Inactive ?Necrotic Tissue ?Nursing Diagnoses: ?Impaired tissue integrity related to necrotic/devitalized tissue ?Knowledge deficit related to management of necrotic/devitalized tissue ?Goals: ?Necrotic/devitalized tissue will be minimized in the wound bed ?Date Initiated: 02/18/2022 ?Target Resolution Date: 03/14/2022 ?Goal Status: Active ?Patient/caregiver will verbalize understanding of reason and process for debridement of necrotic tissue ?Date Initiated: 02/18/2022 ?Target Resolution Date: 03/14/2022 ?Goal Status: Active ?Interventions: ?Assess patient pain level pre-, during and post procedure and prior to discharge ?Provide education on necrotic tissue and debridement process ?Treatment Activities: ?Apply topical anesthetic as ordered : 02/18/2022 ?Notes: ?Wound/Skin Impairment ?Nursing Diagnoses: ?Knowledge deficit related to ulceration/compromised skin integrity ?Goals: ?Patient/caregiver will verbalize understanding of skin care regimen ?Date Initiated: 02/18/2022 ?Target  Resolution Date: 03/14/2022 ?Goal Status: Active ?Interventions: ?Assess patient/caregiver ability to perform ulcer/skin care regimen upon admission and as needed ?Assess ulceration(s) every visit ?Provide education on [place]

## 2022-03-17 ENCOUNTER — Encounter (HOSPITAL_BASED_OUTPATIENT_CLINIC_OR_DEPARTMENT_OTHER): Payer: Medicare PPO | Admitting: Internal Medicine

## 2022-03-17 DIAGNOSIS — T24232D Burn of second degree of left lower leg, subsequent encounter: Secondary | ICD-10-CM | POA: Diagnosis not present

## 2022-03-17 DIAGNOSIS — G20 Parkinson's disease: Secondary | ICD-10-CM | POA: Diagnosis not present

## 2022-03-17 DIAGNOSIS — Z87891 Personal history of nicotine dependence: Secondary | ICD-10-CM | POA: Diagnosis not present

## 2022-03-17 DIAGNOSIS — T25232D Burn of second degree of left toe(s) (nail), subsequent encounter: Secondary | ICD-10-CM | POA: Diagnosis not present

## 2022-03-17 DIAGNOSIS — E11622 Type 2 diabetes mellitus with other skin ulcer: Secondary | ICD-10-CM | POA: Diagnosis not present
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
## 2022-03-24 ENCOUNTER — Encounter (HOSPITAL_BASED_OUTPATIENT_CLINIC_OR_DEPARTMENT_OTHER): Payer: Medicare PPO | Admitting: Internal Medicine

## 2022-03-24 DIAGNOSIS — E11622 Type 2 diabetes mellitus with other skin ulcer: Secondary | ICD-10-CM | POA: Diagnosis not present

## 2022-03-24 DIAGNOSIS — G20 Parkinson's disease: Secondary | ICD-10-CM | POA: Diagnosis not present

## 2022-03-24 DIAGNOSIS — Z87891 Personal history of nicotine dependence: Secondary | ICD-10-CM | POA: Diagnosis not present

## 2022-03-24 DIAGNOSIS — T24232D Burn of second degree of left lower leg, subsequent encounter: Secondary | ICD-10-CM | POA: Diagnosis not present

## 2022-03-24 DIAGNOSIS — T25232D Burn of second degree of left toe(s) (nail), subsequent encounter: Secondary | ICD-10-CM | POA: Diagnosis not present
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
## 2022-03-25 DIAGNOSIS — M5416 Radiculopathy, lumbar region: Secondary | ICD-10-CM | POA: Diagnosis not present

## 2022-03-25 DIAGNOSIS — M5459 Other low back pain: Secondary | ICD-10-CM | POA: Diagnosis not present

## 2022-03-25 DIAGNOSIS — M25551 Pain in right hip: Secondary | ICD-10-CM | POA: Diagnosis not present

## 2022-04-17 DIAGNOSIS — L821 Other seborrheic keratosis: Secondary | ICD-10-CM | POA: Diagnosis not present

## 2022-04-17 DIAGNOSIS — L570 Actinic keratosis: Secondary | ICD-10-CM | POA: Diagnosis not present

## 2022-04-17 DIAGNOSIS — D225 Melanocytic nevi of trunk: Secondary | ICD-10-CM | POA: Diagnosis not present

## 2022-04-17 DIAGNOSIS — L814 Other melanin hyperpigmentation: Secondary | ICD-10-CM | POA: Diagnosis not present

## 2022-04-22 NOTE — Progress Notes (Signed)
[address], [place] ([id number]) Visit Report for 03/24/2022 Arrival Information Details Patient Name: Date of Service: Scott Wang ER, RO Scott Wang 03/24/2022 9:00 A M Medical Record Number: [id number] Patient Account Number: [id number] Date of Birth/Sex: Treating RN: [birth year] (81 y.o. Scott Wang, Scott Wang Primary Care Scott Wang: Sueanne Margarita Other Clinician: Referring Scott Wang: Treating Scott Wang/Extender: Bartholome Bill in Treatment: 4 Visit Information History Since Last Visit Added or deleted any medications: No Patient Arrived: Ambulatory Any new allergies or adverse reactions: No Arrival Time: 08:59 Had a fall or experienced change in No Accompanied By: wife activities of daily living that may affect Transfer Assistance: None risk of falls: Patient Requires Transmission-Based Precautions: No Signs or symptoms of abuse/neglect since last visito No Patient Has Alerts: No Hospitalized since last visit: No Implantable device outside of the clinic excluding No cellular tissue based products placed in the center since last visit: Has Dressing in Place as Prescribed: Yes Pain Present Now: No Electronic Signature(s) Signed: 04/22/2022 8:46:54 AM By: Erenest Blank Entered By: Erenest Blank on 03/24/2022 09:01:34 -------------------------------------------------------------------------------- Clinic Level of Care Assessment Details Patient Name: Date of Service: Scott V ER, [name] 03/24/2022 9:00 A M Medical Record Number: [id number] Patient Account Number: [id number] Date of Birth/Sex: Treating RN: 10/22/1941 (81 y.o. Scott Wang, Scott Wang Primary Care Scott Wang: Sueanne Margarita Other Clinician: Referring Scott Wang: Treating Scott Wang/Extender: Bartholome Bill in Treatment: 4 Clinic Level of Care Assessment Items TOOL 4 Quantity Score X- 1 0 Use when only an EandM is performed on FOLLOW-UP visit ASSESSMENTS - Nursing Assessment / Reassessment X- 1  10 Reassessment of Co-morbidities (includes updates in patient status) X- 1 5 Reassessment of Adherence to Treatment Plan ASSESSMENTS - Wound and Skin A ssessment / Reassessment X - Simple Wound Assessment / Reassessment - one wound 1 5 '[]'$  - 0 Complex Wound Assessment / Reassessment - multiple wounds X- 1 10 Dermatologic / Skin Assessment (not related to wound area) ASSESSMENTS - Focused Assessment X- 1 5 Circumferential Edema Measurements - multi extremities '[]'$  - 0 Nutritional Assessment / Counseling / Intervention '[]'$  - 0 Lower Extremity Assessment (monofilament, tuning fork, pulses) '[]'$  - 0 Peripheral Arterial Disease Assessment (using hand held doppler) ASSESSMENTS - Ostomy and/or Continence Assessment and Care '[]'$  - 0 Incontinence Assessment and Management '[]'$  - 0 Ostomy Care Assessment and Management (repouching, etc.) PROCESS - Coordination of Care X - Simple Patient / Family Education for ongoing care 1 15 '[]'$  - 0 Complex (extensive) Patient / Family Education for ongoing care X- 1 10 Staff obtains Programmer, systems, Records, T Results / Process Orders est '[]'$  - 0 Staff telephones HHA, Nursing Homes / Clarify orders / etc '[]'$  - 0 Routine Transfer to another Facility (non-emergent condition) '[]'$  - 0 Routine Hospital Admission (non-emergent condition) '[]'$  - 0 New Admissions / Biomedical engineer / Ordering NPWT Apligraf, etc. , '[]'$  - 0 Emergency Hospital Admission (emergent condition) X- 1 10 Simple Discharge Coordination '[]'$  - 0 Complex (extensive) Discharge Coordination PROCESS - Special Needs '[]'$  - 0 Pediatric / Minor Patient Management '[]'$  - 0 Isolation Patient Management '[]'$  - 0 Hearing / Language / Visual special needs '[]'$  - 0 Assessment of Community assistance (transportation, D/C planning, etc.) '[]'$  - 0 Additional assistance / Altered mentation '[]'$  - 0 Support Surface(s) Assessment (bed, cushion, seat, etc.) INTERVENTIONS - Wound Cleansing / Measurement X - Simple  Wound Cleansing - one wound 1 5 '[]'$  - 0 Complex Wound Cleansing - multiple wounds X- 1 5 Wound Imaging (  photographs - any number of wounds) '[]'$  - 0 Wound Tracing (instead of photographs) X- 1 5 Simple Wound Measurement - one wound '[]'$  - 0 Complex Wound Measurement - multiple wounds INTERVENTIONS - Wound Dressings '[]'$  - 0 Small Wound Dressing one or multiple wounds '[]'$  - 0 Medium Wound Dressing one or multiple wounds '[]'$  - 0 Large Wound Dressing one or multiple wounds '[]'$  - 0 Application of Medications - topical '[]'$  - 0 Application of Medications - injection INTERVENTIONS - Miscellaneous '[]'$  - 0 External ear exam '[]'$  - 0 Specimen Collection (cultures, biopsies, blood, body fluids, etc.) '[]'$  - 0 Specimen(s) / Culture(s) sent or taken to Lab for analysis '[]'$  - 0 Patient Transfer (multiple staff / Civil Service fast streamer / Similar devices) '[]'$  - 0 Simple Staple / Suture removal (25 or less) '[]'$  - 0 Complex Staple / Suture removal (26 or more) '[]'$  - 0 Hypo / Hyperglycemic Management (close monitor of Blood Glucose) '[]'$  - 0 Ankle / Brachial Index (ABI) - do not check if billed separately X- 1 5 Vital Signs Has the patient been seen at the hospital within the last three years: Yes Total Score: 90 Level Of Care: New/Established - Level 3 Electronic Signature(s) Signed: 03/24/2022 5:07:15 PM By: Deon Pilling RN, BSN Entered By: Deon Pilling on 03/24/2022 09:29:57 -------------------------------------------------------------------------------- Encounter Discharge Information Details Patient Name: Date of Service: Scott Wang ER, [name] 03/24/2022 9:00 A M Medical Record Number: [id number] Patient Account Number: [id number] Date of Birth/Sex: Treating RN: 1940-12-09 (81 y.o. Scott Wang Primary Care Scott Wang: Sueanne Margarita Other Clinician: Referring Scott Wang: Treating Scott Wang/Extender: Bartholome Bill in Treatment: 4 Encounter Discharge Information Items Discharge Condition:  Stable Ambulatory Status: Ambulatory Discharge Destination: Home Transportation: Private Auto Accompanied By: wife Schedule Follow-up Appointment: No Clinical Summary of Care: Electronic Signature(s) Signed: 03/24/2022 5:07:15 PM By: Deon Pilling RN, BSN Entered By: Deon Pilling on 03/24/2022 09:30:28 -------------------------------------------------------------------------------- Lower Extremity Assessment Details Patient Name: Date of Service: Scott V ER, [name] 03/24/2022 9:00 A M Medical Record Number: [id number] Patient Account Number: [id number] Date of Birth/Sex: Treating RN: 15-Feb-1941 (81 y.o. Scott Wang Primary Care Theoden Mauch: Sueanne Margarita Other Clinician: Referring Braydan Marriott: Treating Omarr Hann/Extender: Vivia Budge Weeks in Treatment: 4 Edema Assessment Assessed: [Left: No] Patrice Paradise: No] Edema: [Left: N] [Right: o] Calf Left: Right: Point of Measurement: 35 cm From Medial Instep 35.3 cm Ankle Left: Right: Point of Measurement: 9 cm From Medial Instep 20.7 cm Vascular Assessment Pulses: Dorsalis Pedis Palpable: [Left:Yes] Electronic Signature(s) Signed: 03/24/2022 5:07:15 PM By: Deon Pilling RN, BSN Signed: 04/22/2022 8:46:54 AM By: Erenest Blank Entered By: Erenest Blank on 03/24/2022 09:12:54 -------------------------------------------------------------------------------- Multi Wound Chart Details Patient Name: Date of Service: Scott Wang ER, [name] 03/24/2022 9:00 A M Medical Record Number: [id number] Patient Account Number: [id number] Date of Birth/Sex: Treating RN: April 17, 1941 (81 y.o. Scott Wang Primary Care Railey Glad: Sueanne Margarita Other Clinician: Referring Shaquia Berkley: Treating Anette Barra/Extender: Bartholome Bill in Treatment: 4 Vital Signs Height(in): 68 Pulse(bpm): 76 Weight(lbs): 170 Blood Pressure(mmHg): 123/69 Body Mass Index(BMI): 25.8 Temperature(F): 98.2 Respiratory Rate(breaths/min):  16 Photos: [N/A:N/A] Left, Lateral Lower Leg N/A N/A Wound Location: Thermal Burn N/A N/A Wounding Event: Diabetic Wound/Ulcer of the Lower N/A N/A Primary Etiology: Extremity Hypertension, Type II Diabetes, N/A N/A Comorbid History: Received Radiation 01/24/2022 N/A N/A Date Acquired: 4 N/A N/A Weeks of Treatment: Healed - Epithelialized N/A N/A Wound Status: No N/A N/A Wound Recurrence: Yes N/A N/A Clustered Wound: 3  N/A N/A Clustered Quantity: 0x0x0 N/A N/A Measurements L x W x D (cm) 0 N/A N/A A (cm) : rea 0 N/A N/A Volume (cm) : 100.00% N/A N/A % Reduction in A rea: 100.00% N/A N/A % Reduction in Volume: Grade 2 N/A N/A Classification: Medium N/A N/A Exudate A mount: Serosanguineous N/A N/A Exudate Type: red, brown N/A N/A Exudate Color: Distinct, outline attached N/A N/A Wound Margin: None Present (0%) N/A N/A Granulation A mount: None Present (0%) N/A N/A Necrotic A mount: Fat Layer (Subcutaneous Tissue): Yes N/A N/A Exposed Structures: Fascia: No Tendon: No Muscle: No Joint: No Bone: No Large (67-100%) N/A N/A Epithelialization: Treatment Notes Electronic Signature(s) Signed: 03/24/2022 10:18:54 AM By: Kalman Shan DO Signed: 03/24/2022 5:07:15 PM By: Deon Pilling RN, BSN Entered By: Kalman Shan on 03/24/2022 09:47:26 -------------------------------------------------------------------------------- Multi-Disciplinary Care Plan Details Patient Name: Date of Service: Scott Wang ER, [name] 03/24/2022 9:00 A M Medical Record Number: [id number] Patient Account Number: [id number] Date of Birth/Sex: Treating RN: 1941/06/23 (81 y.o. Scott Wang Primary Care Malkia Nippert: Sueanne Margarita Other Clinician: Referring Abdifatah Colquhoun: Treating Aydin Cavalieri/Extender: Bartholome Bill in Treatment: 4 Active Inactive Electronic Signature(s) Signed: 03/24/2022 5:07:15 PM By: Deon Pilling RN, BSN Entered By: Deon Pilling on 03/24/2022  09:29:15 -------------------------------------------------------------------------------- Pain Assessment Details Patient Name: Date of Service: Scott Wang ER, [name] 03/24/2022 9:00 A M Medical Record Number: [id number] Patient Account Number: [id number] Date of Birth/Sex: Treating RN: 1941/02/09 (81 y.o. Scott Wang Primary Care Joslynne Klatt: Sueanne Margarita Other Clinician: Referring Lexine Jaspers: Treating Teryn Boerema/Extender: Bartholome Bill in Treatment: 4 Active Problems Location of Pain Severity and Description of Pain Patient Has Paino No Site Locations Pain Management and Medication Current Pain Management: Electronic Signature(s) Signed: 03/24/2022 5:07:15 PM By: Deon Pilling RN, BSN Signed: 04/22/2022 8:46:54 AM By: Erenest Blank Entered By: Erenest Blank on 03/24/2022 09:01:59 -------------------------------------------------------------------------------- Patient/Caregiver Education Details Patient Name: Date of Service: Scott Wang ER, RO Y Alfonso Patten 5/15/2023andnbsp9:00 A M Medical Record Number: [id number] Patient Account Number: [id number] Date of Birth/Gender: Treating RN: 07/24/1941 (81 y.o. Scott Wang Primary Care Physician: Sueanne Margarita Other Clinician: Referring Physician: Treating Physician/Extender: Bartholome Bill in Treatment: 4 Education Assessment Education Provided To: Patient Education Topics Provided Wound/Skin Impairment: Handouts: Skin Care Do's and Dont's Methods: Explain/Verbal Responses: Reinforcements needed Electronic Signature(s) Signed: 03/24/2022 5:07:15 PM By: Deon Pilling RN, BSN Entered By: Deon Pilling on 03/24/2022 09:29:25 -------------------------------------------------------------------------------- Wound Assessment Details Patient Name: Date of Service: Scott Wang ER, [name] 03/24/2022 9:00 A M Medical Record Number: [id number] Patient Account Number: [id number] Date of  Birth/Sex: Treating RN: Aug 27, 1941 (81 y.o. Scott Wang Primary Care Tharon Bomar: Sueanne Margarita Other Clinician: Referring Etty Isaac: Treating Silvina Hackleman/Extender: Vivia Budge Weeks in Treatment: 4 Wound Status Wound Number: 1 Primary Etiology: Diabetic Wound/Ulcer of the Lower Extremity Wound Location: Left, Lateral Lower Leg Wound Status: Healed - Epithelialized Wounding Event: Thermal Burn Comorbid History: Hypertension, Type II Diabetes, Received Radiation Date Acquired: 01/24/2022 Weeks Of Treatment: 4 Clustered Wound: Yes Photos Wound Measurements Length: (cm) Width: (cm) Depth: (cm) Clustered Quantity: Area: (cm) Volume: (cm) 0 % Reduction in Area: 100% 0 % Reduction in Volume: 100% 0 Epithelialization: Large (67-100%) 3 Tunneling: No 0 Undermining: No 0 Wound Description Classification: Grade 2 Wound Margin: Distinct, outline attached Exudate Amount: Medium Exudate Type: Serosanguineous Exudate Color: red, brown Foul Odor After Cleansing: No Slough/Fibrino No Wound Bed Granulation Amount: None Present (0%) Exposed Structure Necrotic Amount: None  Present (0%) Fascia Exposed: No Fat Layer (Subcutaneous Tissue) Exposed: Yes Tendon Exposed: No Muscle Exposed: No Joint Exposed: No Bone Exposed: No Electronic Signature(s) Signed: 03/24/2022 5:07:15 PM By: Deon Pilling RN, BSN Entered By: Deon Pilling on 03/24/2022 09:28:36 -------------------------------------------------------------------------------- Vitals Details Patient Name: Date of Service: Scott Wang ER, [name] 03/24/2022 9:00 A M Medical Record Number: [id number] Patient Account Number: [id number] Date of Birth/Sex: Treating RN: 1941-06-03 (81 y.o. Scott Wang Primary Care Emmery Seiler: Sueanne Margarita Other Clinician: Referring Shaquaya Wuellner: Treating Domique Clapper/Extender: Bartholome Bill in Treatment: 4 Vital Signs Time Taken: 09:01 Temperature (F):  98.2 Height (in): 68 Pulse (bpm): 76 Weight (lbs): 170 Respiratory Rate (breaths/min): 16 Body Mass Index (BMI): 25.8 Blood Pressure (mmHg): 123/69 Reference Range: 80 - 120 mg / dl Electronic Signature(s) Signed: 04/22/2022 8:46:54 AM By: Erenest Blank Entered By: Erenest Blank on 03/24/2022 09:01:53

## 2022-04-28 ENCOUNTER — Ambulatory Visit: Payer: Medicare PPO | Admitting: Neurology

## 2022-05-01 ENCOUNTER — Telehealth: Payer: Self-pay | Admitting: Neurology

## 2022-05-01 NOTE — Telephone Encounter (Signed)
Wife is asking for a call to r/s pt's Botox

## 2022-05-01 NOTE — Telephone Encounter (Signed)
Cancelled Botox appt due to MD being out- LVM and sent MyChart message informing pt.

## 2022-05-07 ENCOUNTER — Ambulatory Visit: Payer: Medicare PPO | Admitting: Neurology

## 2022-05-07 ENCOUNTER — Ambulatory Visit: Payer: Medicare PPO | Admitting: Pulmonary Disease

## 2022-05-16 ENCOUNTER — Other Ambulatory Visit: Payer: Self-pay | Admitting: Neurology

## 2022-05-19 ENCOUNTER — Ambulatory Visit: Payer: Medicare PPO | Admitting: Neurology

## 2022-05-22 ENCOUNTER — Ambulatory Visit: Payer: Medicare PPO | Admitting: Neurology

## 2022-05-22 DIAGNOSIS — K117 Disturbances of salivary secretion: Secondary | ICD-10-CM | POA: Diagnosis not present

## 2022-05-22 DIAGNOSIS — G20 Parkinson's disease: Secondary | ICD-10-CM | POA: Diagnosis not present

## 2022-05-22 MED ORDER — ONABOTULINUMTOXINA 100 UNITS IJ SOLR
100.0000 [IU] | Freq: Once | INTRAMUSCULAR | Status: AC
  Administered 2022-05-22: 70 [IU] via INTRAMUSCULAR

## 2022-05-22 NOTE — Progress Notes (Addendum)
Scott Wang is an 81 year old right-handed gentleman with an underlying medical history of hypertension, hyperlipidemia, prostate cancer, spinal stenosis, diabetes, and Parkinson's disease, associated with memory loss, orthostatic hypotension, and sialorrhea who presents for This is a botulinum toxin injection for intractable sialorrhea.  The patient is accompanied by his wife again today.  He had his first injection on 02/03/2022, at which time he received 50 units of Botox total dose divided in both parotid glands.  Today, 05/22/2022: He reports that the first injection did not last more than 2 weeks if at all.  He did not notice any telltale effect, wife did not think it helped much at all.  He certainly did not have any side effects, in particular, no difficulty swallowing, chewing, speaking, breathing.  No droopiness in his face.  He is agreeable to increasing the dose today.  We will go up to 35 units bilaterally for total dose of 70 units today.     Written informed consent for recurrent, 3 monthly intramuscular injections with botulinum toxin for this indication was obtained on 02/03/2022 and will be scanned into the patient's electronic chart. I will re-consent if the type of botulinum toxin used or the indication for injection changes for this patient in the future. The patient is informed that we will use the same consent for His recurrent, most likely 3 monthly injections. He demonstrated understanding and voiced agreement.   I talked to the patient in detail about expectations, limitations, benefits as well as potential adverse effects of botulinum toxin injections. The patient understands that the side effects include (but are not limited to): Mouth dryness, dryness of eyes, speech and swallowing difficulties, respiratory depression or problems breathing, weakness of muscles including more distant muscles than the ones injected, flu-like symptoms, myalgias, injection site reactions such as  redness, itching, swelling, pain, and infection.  Distant effect of toxin is explained.   100 units of botulinum toxin type A in the form of Botox were reconstituted using preservative-free normal saline to a concentration of 10 units per 0.1 mL and drawn up into 1 mL tuberculin syringes. Lot number and expiration date as below.     The patient was situated in a chair, sitting comfortably. After preparing the areas with 70% isopropyl alcohol and using a 30 gauge 1/2 inch needle for the facial injections, a total dose of 70 units of botulinum toxin type A in the form of Botox was injected into the muscles and the following distribution and quantities:   #1:  35 units in the right parotid gland, 35 units in the left parotid gland.     EMG guidance was not utilized for these injections.  A total dose of 30 units out of 100 units were discarded as unavoidable waste.     The patient tolerated the procedure well without immediate complications. He was advised to make a followup appointment for repeat injections in 3 months from now and encouraged to call us with any interim questions, concerns, problems, or updates. He was in agreement and did not have any questions prior to leaving clinic today.  Botox 100 units B&B; LOT: [id number], EXP: 09/2024, NDC 4010-2725-36. 0.9% Bacteriostatic Saline LOT: [id number], EXP: 06/11/2023, NDC: 7425-9563-87

## 2022-05-22 NOTE — Patient Instructions (Signed)
Please remember, botulinum toxin takes about 3-7 days to kick in. As discussed, this is not a pain shot. The purpose of the injections is to gradually improve your symptoms. In some patients it takes up to 2-3 weeks to make a difference and it wears off with time. Sometimes it may wear off before it is time for the next injection. We still should wait till the next 3 monthly injection, because injecting too frequently may cause you to develop immunity to the botulinum toxin. We are looking for a reduction in the severity and/or frequency of your symptoms. As a reminder, side effects to look out for are (but not limited to): mouth dryness, dryness of the eyes, heaviness of your head or muscle weakness, including droopy face or droopy eyelid(s), rarely: speech or swallowing difficulties and very rarely: breathing difficulties. Some people have transient neck pain or soreness which typically responds to over-the-counter anti-inflammatory medication and local heat application with a heat pad. If you think you have a severe reaction to the botulinum toxin, such as weakness, trouble speaking, trouble breathing, or trouble swallowing, you have to call 911 or have someone take you to the nearest emergency room. However, most people have either no or minimal side effects from the injections. It is normal to have a little bit of redness and swelling around the injection sites which usually improves after a few hours. Rarely, there may be a bruise that improves on its own. Most side effects reported are very mild and resolve within 10-14 days. Please feel free to call us if you have any additional questions or concerns: [phone] or email us through My Chart. We may have to adjust the dose over time, depending on your results from this injection and your overall response over time to this medication.   

## 2022-06-04 DIAGNOSIS — M7061 Trochanteric bursitis, right hip: Secondary | ICD-10-CM | POA: Diagnosis not present

## 2022-06-11 ENCOUNTER — Ambulatory Visit: Payer: Medicare PPO | Admitting: Podiatry

## 2022-06-11 ENCOUNTER — Encounter: Payer: Self-pay | Admitting: Podiatry

## 2022-06-11 DIAGNOSIS — M79674 Pain in right toe(s): Secondary | ICD-10-CM | POA: Diagnosis not present

## 2022-06-11 DIAGNOSIS — B351 Tinea unguium: Secondary | ICD-10-CM | POA: Diagnosis not present

## 2022-06-11 DIAGNOSIS — M79675 Pain in left toe(s): Secondary | ICD-10-CM

## 2022-06-11 NOTE — Progress Notes (Signed)
  Subjective:  Patient ID: BRANDUN PINN, male    DOB: [birth year],  MRN: [id number]  Chief Complaint  Patient presents with   Nail Problem   81 y.o. male returns for the above complaint.  Patient presents with thickened elongated dystrophic toenails x10 mild pain on palpation.  Hurts with ambulation is not able to be done as himself he would likely do it.  Denies any other acute complaints.  Objective:  There were no vitals filed for this visit. Podiatric Exam: Vascular: dorsalis pedis and posterior tibial pulses are palpable bilateral. Capillary return is immediate. Temperature gradient is WNL. Skin turgor WNL  Sensorium: Normal Semmes Weinstein monofilament test. Normal tactile sensation bilaterally. Nail Exam: Pt has thick disfigured discolored nails with subungual debris noted bilateral entire nail hallux through fifth toenails.  Pain on palpation to the nails. Ulcer Exam: There is no evidence of ulcer or pre-ulcerative changes or infection. Orthopedic Exam: Muscle tone and strength are WNL. No limitations in general ROM. No crepitus or effusions noted.  Skin: No Porokeratosis. No infection or ulcers    Assessment & Plan:   1. Pain due to onychomycosis of toenails of both feet     Patient was evaluated and treated and all questions answered.  Onychomycosis with pain  -Nails palliatively debrided as below. -Educated on self-care  Procedure: Nail Debridement Rationale: pain  Type of Debridement: manual, sharp debridement. Instrumentation: Nail nipper, rotary burr. Number of Nails: 10  Procedures and Treatment: Consent by patient was obtained for treatment procedures. The patient understood the discussion of treatment and procedures well. All questions were answered thoroughly reviewed. Debridement of mycotic and hypertrophic toenails, 1 through 5 bilateral and clearing of subungual debris. No ulceration, no infection noted.  Return Visit-Office Procedure: Patient instructed to  return to the office for a follow up visit 3 months for continued evaluation and treatment.  Boneta Lucks, DPM    Return in about 3 months (around 09/11/2022).

## 2022-07-03 ENCOUNTER — Ambulatory Visit
Admission: RE | Admit: 2022-07-03 | Discharge: 2022-07-03 | Disposition: A | Discharge status: Home / Self Care | Payer: Medicare PPO | Source: Ambulatory Visit | Attending: Pulmonary Disease | Admitting: Pulmonary Disease

## 2022-07-03 DIAGNOSIS — J432 Centrilobular emphysema: Secondary | ICD-10-CM | POA: Diagnosis not present

## 2022-07-03 DIAGNOSIS — I700 Atherosclerosis of aorta: Secondary | ICD-10-CM | POA: Diagnosis not present

## 2022-07-03 DIAGNOSIS — J849 Interstitial pulmonary disease, unspecified: Secondary | ICD-10-CM

## 2022-07-03 DIAGNOSIS — J929 Pleural plaque without asbestos: Secondary | ICD-10-CM | POA: Diagnosis not present

## 2022-07-09 DIAGNOSIS — M25551 Pain in right hip: Secondary | ICD-10-CM | POA: Diagnosis not present

## 2022-07-09 DIAGNOSIS — M7061 Trochanteric bursitis, right hip: Secondary | ICD-10-CM | POA: Diagnosis not present

## 2022-07-09 DIAGNOSIS — M7062 Trochanteric bursitis, left hip: Secondary | ICD-10-CM | POA: Diagnosis not present

## 2022-07-09 DIAGNOSIS — M25552 Pain in left hip: Secondary | ICD-10-CM | POA: Diagnosis not present

## 2022-07-10 DIAGNOSIS — H524 Presbyopia: Secondary | ICD-10-CM | POA: Diagnosis not present

## 2022-07-10 DIAGNOSIS — Z961 Presence of intraocular lens: Secondary | ICD-10-CM | POA: Diagnosis not present

## 2022-07-10 DIAGNOSIS — H52203 Unspecified astigmatism, bilateral: Secondary | ICD-10-CM | POA: Diagnosis not present

## 2022-07-10 DIAGNOSIS — G20 Parkinson's disease: Secondary | ICD-10-CM | POA: Diagnosis not present

## 2022-07-10 DIAGNOSIS — E119 Type 2 diabetes mellitus without complications: Secondary | ICD-10-CM | POA: Diagnosis not present

## 2022-08-06 ENCOUNTER — Telehealth: Payer: Self-pay | Admitting: Neurology

## 2022-08-06 NOTE — Telephone Encounter (Signed)
Pt is calling. Stated the last two Botox shot didn't help. Pt said he need something stronger.

## 2022-08-06 NOTE — Telephone Encounter (Signed)
Spoke with patient and advised Dr Rexene Alberts says we would consider increasing the Botox at his visit next week. Pt verbalized understanding and appreciation.

## 2022-08-06 NOTE — Telephone Encounter (Signed)
We can consider increasing the dose with the next scheduled Botox injection.

## 2022-08-11 ENCOUNTER — Encounter: Payer: Self-pay | Admitting: Neurology

## 2022-08-11 ENCOUNTER — Ambulatory Visit: Payer: Medicare PPO | Admitting: Neurology

## 2022-08-11 VITALS — BP 123/67 | HR 73 | Ht 66.0 in | Wt 167.0 lb

## 2022-08-11 DIAGNOSIS — K117 Disturbances of salivary secretion: Secondary | ICD-10-CM

## 2022-08-11 MED ORDER — ONABOTULINUMTOXINA 100 UNITS IJ SOLR
80.0000 [IU] | Freq: Once | INTRAMUSCULAR | Status: AC
  Administered 2022-08-11: 80 [IU] via INTRAMUSCULAR

## 2022-08-11 NOTE — Patient Instructions (Signed)
Please remember, botulinum toxin takes about 3-7 days to kick in. As discussed, this is not a pain shot. The purpose of the injections is to gradually improve your symptoms. In some patients it takes up to 2-3 weeks to make a difference and it wears off with time. Sometimes it may wear off before it is time for the next injection. We still should wait till the next 3 monthly injection, because injecting too frequently may cause you to develop immunity to the botulinum toxin. We are looking for a reduction in the severity and/or frequency of your symptoms. As a reminder, side effects to look out for are (but not limited to): mouth dryness, dryness of the eyes, heaviness of your head or muscle weakness, including droopy face or droopy eyelid(s), rarely: speech or swallowing difficulties and very rarely: breathing difficulties. Some people have transient neck pain or soreness which typically responds to over-the-counter anti-inflammatory medication and local heat application with a heat pad. If you think you have a severe reaction to the botulinum toxin, such as weakness, trouble speaking, trouble breathing, or trouble swallowing, you have to call 911 or have someone take you to the nearest emergency room. However, most people have either no or minimal side effects from the injections. It is normal to have a little bit of redness and swelling around the injection sites which usually improves after a few hours. Rarely, there may be a bruise that improves on its own. Most side effects reported are very mild and resolve within 10-14 days. Please feel free to call us if you have any additional questions or concerns: [phone] or email us through My Chart. We may have to adjust the dose over time, depending on your results from this injection and your overall response over time to this medication.   

## 2022-08-11 NOTE — Progress Notes (Signed)
Subjective:    Patient ID: Scott Wang is a 81 y.o. male.  HPI  Scott Wang is an 81 year old right-handed gentleman with an underlying medical history of hypertension, hyperlipidemia, prostate cancer, spinal stenosis, diabetes, and Parkinson's disease, associated with memory loss, orthostatic hypotension, and sialorrhea who presents for his 3rd botulinum toxin injection for intractable sialorrhea.  The patient is accompanied by his wife again today. He had his 2nd Botox injection on 05/22/2022, at which time we increased his dose to a total of 70 units with 35 units injected in each parotid gland.   He called in the interim in late September 2023 indicating that the Botox had not helped.   Today, 08/11/2022: He reports no significant improvement from the last 2 injections, no side effects, in particular, no weakness.  He is agreeable to dose increase.  I will increase his dose to 80 units total, 40 units on each side.   Written informed consent for recurrent, 3 monthly intramuscular injections with botulinum toxin for this indication was obtained on 02/03/2022 and will be scanned into the patient's electronic chart. I will re-consent if the type of botulinum toxin used or the indication for injection changes for this patient in the future. The patient is informed that we will use the same consent for His recurrent, most likely 3 monthly injections. He demonstrated understanding and voiced agreement.   I have talked to the patient in detail about expectations, limitations, benefits as well as potential adverse effects of botulinum toxin injections. The patient understands that the side effects include (but are not limited to): Mouth dryness, dryness of eyes, speech and swallowing difficulties, respiratory depression or problems breathing, weakness of muscles including more distant muscles than the ones injected, flu-like symptoms, myalgias, injection site reactions such as redness, itching, swelling,  pain, and infection.  Distant effect of toxin has been explained.   100 units of botulinum toxin type A in the form of Botox were reconstituted using preservative-free normal saline to a concentration of 10 units per 0.1 mL and drawn up into 1 mL tuberculin syringes. Lot number and expiration dates as below.     O/E: BP 123/67   Pulse 73   Ht '5\' 6"'$  (1.676 m)   Wt 167 lb (75.8 kg)   BMI 26.95 kg/m   The patient was situated in a chair, sitting comfortably. After preparing the areas with 70% isopropyl alcohol and using a 30 gauge 1/2 inch needle for the facial injections, a total dose of 80 units of botulinum toxin type A in the form of Botox was injected into the muscles and the following distribution and quantities:   #1:  40 units in the right parotid gland, 40 units in the left parotid gland.     EMG guidance was not utilized for these injections.  A total dose of 20 units out of 100 units were discarded as unavoidable waste.     The patient tolerated the procedure well without immediate complications. He was advised to make a followup appointment for repeat injections in 3 months from now and encouraged to call us with any interim questions, concerns, problems, or updates. He was in agreement and did not have any questions prior to leaving clinic today. Botox- 100 units x 1 vials Lot: [id number] Expiration: 08/2024 NDC: 3474-2595-63  Bacteriostatic 0.9% Sodium Chloride- 1.29m total Lot: [id number]: 06/11/2023 NDC: [address]-16 Previously:   He had his first injection on 02/03/2022, at which time he received 50 units of  Botox total dose divided in both parotid glands.   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

## 2022-08-11 NOTE — Progress Notes (Deleted)
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

## 2022-08-14 NOTE — Telephone Encounter (Signed)
Received a fax from [facility] stating a previous request for Botox was submitted and had been approved from 11/10/2021 to 11/09/2022 under authorization # [id number]

## 2022-08-15 ENCOUNTER — Ambulatory Visit (INDEPENDENT_AMBULATORY_CARE_PROVIDER_SITE_OTHER): Payer: Medicare PPO | Admitting: Pulmonary Disease

## 2022-08-15 DIAGNOSIS — J849 Interstitial pulmonary disease, unspecified: Secondary | ICD-10-CM | POA: Diagnosis not present

## 2022-08-15 LAB — PULMONARY FUNCTION TEST
DL/VA % pred: 94 %
DL/VA: 3.69 ml/min/mmHg/L
DLCO cor % pred: 77 %
DLCO cor: 17.84 ml/min/mmHg
DLCO unc % pred: 77 %
DLCO unc: 17.84 ml/min/mmHg
FEF 25-75 Post: 1.9 L/sec
FEF 25-75 Pre: 2.47 L/sec
FEF2575-%Change-Post: -23 %
FEF2575-%Pred-Post: 106 %
FEF2575-%Pred-Pre: 138 %
FEV1-%Change-Post: -8 %
FEV1-%Pred-Post: 88 %
FEV1-%Pred-Pre: 96 %
FEV1-Post: 2.32 L
FEV1-Pre: 2.54 L
FEV1FVC-%Change-Post: -13 %
FEV1FVC-%Pred-Pre: 111 %
FEV6-%Change-Post: 4 %
FEV6-%Pred-Post: 97 %
FEV6-%Pred-Pre: 92 %
FEV6-Post: 3.34 L
FEV6-Pre: 3.18 L
FEV6FVC-%Change-Post: 0 %
FEV6FVC-%Pred-Post: 107 %
FEV6FVC-%Pred-Pre: 107 %
FVC-%Change-Post: 5 %
FVC-%Pred-Post: 90 %
FVC-%Pred-Pre: 86 %
FVC-Post: 3.34 L
Post FEV1/FVC ratio: 69 %
Post FEV6/FVC ratio: 100 %
Pre FEV1/FVC ratio: 80 %
Pre FEV6/FVC Ratio: 100 %
RV % pred: 92 %
RV: 2.36 L
TLC % pred: 81 %
TLC: 5.4 L

## 2022-08-15 NOTE — Patient Instructions (Signed)
Full PFT performed today. °

## 2022-08-15 NOTE — Progress Notes (Signed)
Full PFT performed today. °

## 2022-08-20 ENCOUNTER — Encounter: Payer: Self-pay | Admitting: Adult Health

## 2022-08-20 ENCOUNTER — Ambulatory Visit: Payer: Medicare PPO | Admitting: Adult Health

## 2022-08-20 VITALS — BP 129/74 | HR 72 | Ht 66.0 in | Wt 167.8 lb

## 2022-08-20 DIAGNOSIS — G20A1 Parkinson's disease without dyskinesia, without mention of fluctuations: Secondary | ICD-10-CM

## 2022-08-20 DIAGNOSIS — R413 Other amnesia: Secondary | ICD-10-CM | POA: Diagnosis not present

## 2022-08-20 NOTE — Progress Notes (Signed)
PATIENT: Scott Wang DOB: 09-21-1941  REASON FOR VISIT: follow up HISTORY FROM: patient PRIMARY NEUROLOGIST: Dr. Jaynee Eagles  Chief Complaint  Patient presents with   Follow-up    Pt in 73 with wife  Pt here for parkinson's f/u  Wife state patient is walking a little slower Pt states he freezes  at times when he starts to walk. Pt states balance is off at times      HISTORY OF PRESENT ILLNESS: Today 08/20/22  Scott Wang is a 81 y.o. male who has been followed in this office for Parkinson's diesase.[name] Returns today for follow-up.  Just had Botox injections last week with Dr. Rexene Alberts for sialorrhea.  Remains on Sinemet 25-100 mg 1 tablet twice a day.  Also on Aricept 5 mg at bedtime. Reports some freezing episodes. Tremor is better but notices in left hand. No trouble chewing or swallowing food. No trouble sleeping. Still trying to use a station bike. Stays outside doing things.   HISTORY 11/20/2021: Stable. No swallowing problems. He drools a little bit, not really a problem, no falls, walking is a little worse. He gets dizzy when he gets up he gets dizzy, doesn't really drink a lot of fluids. He could start with hydrating better, usually dark, discussed, slowly get up in the morning, not as good as it used to be. Memory is not as good, decining as far as memory goes, discussed neuropsych testing and they declined, start a little aricept. He has tremors come and go, wife provides information and stable. He exercises with a stionary bike and uses it.    05/21/2021: He started 2 Sinemet IR a day and didn't feel that helped at all. Then went to 3 but that made him dizzy. Tried it every 6 hours. He wanted to try it and see if it would help a lot and didn't really help. And sometimes he does not shake at all. At this time will not take any medications. The shaking gets worse when he is tired or in certain situations, so could take 1/2 a pill or a whole pill situationally and see if that helps.He lost his  balance ponce and fell. He declines PT, he declines classes we provided. No problems swallowing. He feels stable and his wife agrees. When he walks a little bit more shuffling. Bascially stable, no coughing or problems with food, appetite is good.    Interval history 11/29/2020: Decreased radiotracer activity within the posterior aspect of the LEFT and RIGHT putamen while not definitive is a pattern suggestive of Parkinson's syndrome pathology. We discussed parkinson's disease in detail today.    DAT Scan: 11/06/2020:    MRI brain 10/09/2021: MRI brain (with and without) demonstrating: - Mild chronic small vessel ischemic disease. - No acute findings.   MRI c-spine 10/09/2020: MRI cervical spine (without) demonstrating: - At C3-4: disc bulging and facet hypertrophy with severe biforaminal stenosis. - At C4-5: right disc bulging and facet hypertrophy with severe right and moderate left foraminal stenosis.       HPI:  Scott Wang is a 80 y.o. male here as requested by Dr. Nelva Bush, MD for abnormality of gait and mobility. PMHx prostate cancer, HTN, HLD, diabetes.  I reviewed Dr. Lurena Nida notes: He has been seeing Dr. Herma Mering for bilateral L5 selective nerve root block (SNR B) which was some help, he still complaining of bilateral thigh pain, in the morning but his back is feeling much better.  He uses tramadol and Advil  as needed, he feels his balance is off, his right lower leg is tight, but he does feel as though he is doing better, patient has central canal spinal stenosis, bilateral leg pain, severe spinal stenosis L4-L5 and L3-L4, previously evaluated by Dr. Rolena Infante.Dr. Rolena Infante potentially recommended decompression of his lumbar/lumbosacral spine if symptoms do not get better.      Here with his wife who provides much information. slides his feet. He doesn't pick his feet up. He has had tremors for 2 years. Tremors mostly when he is doing something. But he can have tremors at baseline or at rest. Not  getting worse, wife provides most information today. Sometimes he feels off balance especially if turn quickly. He has fallen if he bend over. No FHx of tremors. He doesn't seem as with it but he does have hearing aids now. Feels his expressions has become less so. His voice is softer. Labile blood pressure. Not fitful in bed. He snores but not every night. Doesn't move at night. Difficulty with controlling saliva, Not tired during, not taking naps. He feels refreshed int he morning. A lot slower, reactions are slower. No FHx of Parkinson's disease. Not noticed taste or smell problems. Handwriting has always been messy. No difficulty swallowing. Not repeating things, memory not as good Mo hallucinations.Tremor started in the right hand and now is in the left hand.     Reviewed notes, labs and imaging from outside physicians, which showed: see above    REVIEW OF SYSTEMS: Out of a complete 14 system review of symptoms, the patient complains only of the following symptoms, and all other reviewed systems are negative.  ALLERGIES: Allergies  Allergen Reactions   Sulfa Antibiotics Swelling and Rash    HOME MEDICATIONS: Outpatient Medications Prior to Visit  Medication Sig Dispense Refill   aspirin EC 81 MG tablet Take 1 tablet by mouth daily.     atorvastatin (LIPITOR) 10 MG tablet TAKE 1 TABLET BY MOUTH ONCE A DAY     carbidopa-levodopa (SINEMET IR) 25-100 MG tablet Take 1 tablet by mouth 2 (two) times daily. 180 tablet 3   carvedilol (COREG) 3.125 MG tablet TAKE 1 TABLET BY MOUTH TWICE A DAY WITH FOOD     Cyanocobalamin (VITAMIN B-12 PO) Take 2,500 mcg by mouth daily.     donepezil (ARICEPT) 5 MG tablet TAKE 1 TABLET BY MOUTH EVERYDAY AT BEDTIME 90 tablet 2   KRILL OIL PO Take by mouth. 750     losartan (COZAAR) 100 MG tablet TAKE 1 TABLET EVERY DAY     metFORMIN (GLUCOPHAGE) 500 MG tablet TAKE 1 TABLET TWICE A DAY     Multiple Vitamins-Minerals (ONE-A-DAY 50 PLUS PO) Take 1 tablet by mouth  daily.     Omega-3 Fatty Acids (FISH OIL PO) Take by mouth. 1400 , 2 per day     ONETOUCH DELICA LANCETS FINE MISC TEST ONCE DAILY AND IF NEEDED AS DIRECTED BY PHYSICIAN     traMADol (ULTRAM) 50 MG tablet Take 50 mg by mouth 4 (four) times daily as needed (pain).     Cholecalciferol 125 MCG (5000 UT) capsule Take by mouth. (Patient not taking: Reported on 08/20/2022)     No facility-administered medications prior to visit.    PAST MEDICAL HISTORY: Past Medical History:  Diagnosis Date   Carpal tunnel syndrome    bilateral, had shots in each one and "haven't had any problems since".   Diabetes mellitus    Hyperlipidemia    Hypertension  Prostate cancer ([place])    Spinal stenosis     PAST SURGICAL HISTORY: Past Surgical History:  Procedure Laterality Date   HERNIA REPAIR     HERNIA REPAIR     INGUINAL HERNIA REPAIR     KNEE SURGERY  right   KNEE SURGERY Left    PROSTATE BIOPSY     RADIOACTIVE SEED IMPLANT N/A 09/07/2018   Procedure: RADIOACTIVE SEED IMPLANT/BRACHYTHERAPY IMPLANT;  Surgeon: [name], MD;  Location: [facility];  Service: Urology;  Laterality: N/A;   SPACE OAR INSTILLATION N/A 09/07/2018   Procedure: SPACE OAR INSTILLATION;  Surgeon: [name], MD;  Location: [facility];  Service: Urology;  Laterality: N/A;    FAMILY HISTORY: Family History  Problem Relation Age of Onset   Breast cancer Mother    Heart attack Father    Prostate cancer Neg Hx    Pancreatic cancer Neg Hx    Colon cancer Neg Hx    Tremor Neg Hx    Parkinson's disease Neg Hx     SOCIAL HISTORY: Social History   Socioeconomic History   Marital status: Married    Spouse name: Not on file   Number of children: 2   Years of education: Not on file   Highest education level: Some college, no degree  Occupational History   Occupation: retired  Tobacco Use   Smoking status: Former    Packs/day: 1.00    Types: Cigarettes    Quit date: 2016     Years since quitting: 7.7   Smokeless tobacco: Never  Vaping Use   Vaping Use: Never used  Substance and Sexual Activity   Alcohol use: Never   Drug use: Never   Sexual activity: Not Currently    Comment: 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 Medications have been unsuccessful.  Other Topics Concern   Not on file  Social History Narrative   Resides in [place] with wife. Has two grown sons and three grandchildren.       Lives at home with wife   Right handed   Caffeine: 2 cups coffee/day   Social Determinants of Health   Financial Resource Strain: Not on file  Food Insecurity: Not on file  Transportation Needs: Not on file  Physical Activity: Not on file  Stress: Not on file  Social Connections: Not on file  Intimate Partner Violence: Not on file      PHYSICAL EXAM  Vitals:   08/20/22 1008  BP: 129/74  Pulse: 72  Weight: 167 lb 12.8 oz (76.1 kg)  Height: '5\' 6"'$  (1.676 m)   Body mass index is 27.08 kg/m.     08/20/2022   11:01 AM  MMSE - Mini Mental State Exam  Orientation to time 5  Orientation to Place 4  Registration 3  Attention/ Calculation 5  Recall 3  Language- name 2 objects 2  Language- repeat 1  Language- follow 3 step command 3  Language- read & follow direction 1  Write a sentence 1  Copy design 1  Copy design-comments AFT 13/60sec, clock all numbers , hands correct  Total score 29      Generalized: Well developed, in no acute distress   Neurological examination  Mentation: Alert oriented to time, place, history taking. Follows all commands speech and language fluent Cranial nerve II-XII: Pupils were equal round reactive to light. Extraocular movements were full, visual field were full on confrontational test. Facial sensation and strength were normal. Uvula tongue midline. Head turning and  shoulder shrug  were normal and symmetric. Motor: The motor testing reveals 5 over 5 strength of all 4 extremities. Good symmetric motor tone is noted throughout.   Mild to moderate impairment of finger taps and toe taps bilaterally.  Mild impairment of rapid alternating movement in the upper extremities  Sensory: Sensory testing is intact to soft touch on all 4 extremities. No evidence of extinction is noted.  Coordination: Cerebellar testing reveals good finger-nose-finger and heel-to-shin bilaterally.  Gait and station: Able to stand from a sitting position without assistance.  Good stride with decreased arm swing tremor noted in both hands when ambulating.  4 steps with turns.   DIAGNOSTIC DATA (LABS, IMAGING, TESTING) - I reviewed patient records, labs, notes, testing and imaging myself where available.  Lab Results  Component Value Date   WBC 6.7 09/17/2020   HGB 13.1 09/17/2020   HCT 39.1 09/17/2020   MCV 92 09/17/2020   PLT 210 09/17/2020      Component Value Date/Time   NA 139 09/17/2020 1537   K 4.6 09/17/2020 1537   CL 103 09/17/2020 1537   CO2 23 09/17/2020 1537   GLUCOSE 96 09/17/2020 1537   GLUCOSE 107 (H) 08/31/2018 1056   BUN 14 09/17/2020 1537   CREATININE 1.36 (H) 09/17/2020 1537   CALCIUM 10.4 (H) 09/17/2020 1537   PROT 6.5 09/17/2020 1537   ALBUMIN 4.1 09/17/2020 1537   AST 24 09/17/2020 1537   ALT 27 09/17/2020 1537   ALKPHOS 40 (L) 09/17/2020 1537   BILITOT 0.5 09/17/2020 1537   GFRNONAA 49 (L) 09/17/2020 1537   GFRAA 57 (L) 09/17/2020 1537   No results found for: "CHOL", "HDL", "LDLCALC", "LDLDIRECT", "TRIG", "CHOLHDL" No results found for: "HGBA1C" Lab Results  Component Value Date   VITAMINB12 1,870 (H) 09/17/2020   Lab Results  Component Value Date   TSH 1.420 09/17/2020      ASSESSMENT AND PLAN 81 y.o. year old male  has a past medical history of Carpal tunnel syndrome, Diabetes mellitus, Hyperlipidemia, Hypertension, Prostate cancer ([place]), and Spinal stenosis. here with:  1.  Parkinson's disease 2.  Memory disturbance  -Increase Sinemet 25-100 mg by adding a midday dose.  We will start with half  a tablet midday and then advance to 1 full tablet after 1 week if tolerating well.  Patient then will be taking Sinemet 1 tablet 3 times a day -Encouraged the patient to stay well-hydrated - MMSE 29/30 -Advised if symptoms worsen or he develops new symptoms he should let us know -Follow-up in months or sooner if needed    Ward Givens, MSN, NP-C 08/20/2022, 10:19 AM [facility] [address] [place], [place] [address] [phone]

## 2022-08-20 NOTE — Patient Instructions (Signed)
Your Plan:  Increase Sinemet by adding midday dose. Start with 1/2 tablet for 1 week then increase to 1 full tablet If your symptoms worsen or you develop new symptoms please let us know.    Thank you for coming to see [place] at [facility]. I hope we have been able to provide you high quality care today.  You may receive a patient satisfaction survey over the next few weeks. We would appreciate your feedback and comments so that we may continue to improve ourselves and the health of our patients.

## 2022-08-27 ENCOUNTER — Encounter: Payer: Self-pay | Admitting: Pulmonary Disease

## 2022-08-27 ENCOUNTER — Ambulatory Visit: Payer: Medicare PPO | Admitting: Pulmonary Disease

## 2022-08-27 VITALS — BP 122/66 | HR 71 | Temp 97.9°F | Ht 68.0 in | Wt 167.6 lb

## 2022-08-27 DIAGNOSIS — J849 Interstitial pulmonary disease, unspecified: Secondary | ICD-10-CM | POA: Diagnosis not present

## 2022-08-27 DIAGNOSIS — I10 Essential (primary) hypertension: Secondary | ICD-10-CM | POA: Diagnosis not present

## 2022-08-27 DIAGNOSIS — E1169 Type 2 diabetes mellitus with other specified complication: Secondary | ICD-10-CM | POA: Diagnosis not present

## 2022-08-27 DIAGNOSIS — R7989 Other specified abnormal findings of blood chemistry: Secondary | ICD-10-CM | POA: Diagnosis not present

## 2022-08-27 DIAGNOSIS — Z125 Encounter for screening for malignant neoplasm of prostate: Secondary | ICD-10-CM | POA: Diagnosis not present

## 2022-08-27 DIAGNOSIS — E785 Hyperlipidemia, unspecified: Secondary | ICD-10-CM | POA: Diagnosis not present

## 2022-08-27 NOTE — Progress Notes (Signed)
Scott Wang    [id number]    [birth year]  Primary Care Physician:Skakle, Liane Comber, DO  Referring Physician: Sueanne Margarita, [name],  [name]  Problem list: Follow-up for pulmonary fibrosis  HPI: 81 y.o. with history of Parkinson's, spinal stenosis, diabetes, hypertension, hyperlipidemia Complains of chronic cough for the past several years.  No dyspnea on exertion.  Cough is nonproductive in nature. Denies any joint pain except for spinal pain associated with stenosis.  No rash.  He has occasional difficulty swallowing and heartburn.  Has nighttime snoring but denies any daytime somnolence  He recently had a high-res CT at his primary care showing evidence of pulmonary fibrosis and has been referred here for further evaluation Per the patient he also had a chest x-ray a few weeks ago which showed pneumonia and was treated with antibiotics and prednisone from primary care office.  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  No feather pillows or comforter ILD questionnaire 09/10/2021-negative Smoking history: 20-pack-year smoker quit in 2010 Travel history: No significant travel history Relevant family history: No family history of lung disease  Interim history: Here for review of CT scan and PFTs States that breathing is stable with no issues.  Had his flu, COVID and RSV vaccination at pharmacy today and is feeling a little sore   Outpatient Encounter Medications as of 08/27/2022  Medication Sig   aspirin EC 81 MG tablet Take 1 tablet by mouth daily.   atorvastatin (LIPITOR) 10 MG tablet TAKE 1 TABLET BY MOUTH ONCE A DAY   carbidopa-levodopa (SINEMET IR) 25-100 MG tablet Take 1 tablet by mouth 2 (two) times daily.   carvedilol (COREG) 3.125 MG tablet TAKE 1 TABLET BY MOUTH TWICE A DAY WITH FOOD   Cholecalciferol 125 MCG (5000 UT) capsule Take by mouth.   Cyanocobalamin (VITAMIN B-12 PO)  Take 2,500 mcg by mouth daily.   donepezil (ARICEPT) 5 MG tablet TAKE 1 TABLET BY MOUTH EVERYDAY AT BEDTIME   KRILL OIL PO Take by mouth. 750   losartan (COZAAR) 100 MG tablet TAKE 1 TABLET EVERY DAY   metFORMIN (GLUCOPHAGE) 500 MG tablet TAKE 1 TABLET TWICE A DAY   Multiple Vitamins-Minerals (ONE-A-DAY 50 PLUS PO) Take 1 tablet by mouth daily.   Omega-3 Fatty Acids (FISH OIL PO) Take by mouth. 1400 , 2 per day   ONETOUCH DELICA LANCETS FINE MISC TEST ONCE DAILY AND IF NEEDED AS DIRECTED BY PHYSICIAN   traMADol (ULTRAM) 50 MG tablet Take 50 mg by mouth 4 (four) times daily as needed (pain).   No facility-administered encounter medications on file as of 08/27/2022.     Physical Exam: Blood pressure 122/66, pulse 71, temperature 97.9 F (36.6 C), temperature source Oral, height '5\' 8"'$  (1.727 m), weight 167 lb 9.6 oz (76 kg), SpO2 98 %. 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  High-resolution CT 07/03/2022-stable pattern of pulmonary fibrosis and probable UIP pattern. I have reviewed the images personally.  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  Fortunately the CT shows stability and there is minimal reduction in lung volumes and diffusion capacity After extensive discussion again today with patient and wife they have decided to hold off as they do not want any side effects of medications.  Plan/Recommendations: High-res CT and PFTs in 1 year.  Marshell Garfinkel MD [facility] 08/27/2022, 11:02 AM  CC: Sueanne Margarita, DO

## 2022-08-27 NOTE — Addendum Note (Signed)
Addended by: Elton Sin on: 08/27/2022 11:29 AM   Modules accepted: Orders

## 2022-08-27 NOTE — Patient Instructions (Signed)
I am glad you are stable with regard to your breathing His CT scan shows stability and lung function test shows minimal decrease Per our discussion today we will continue to monitor Order high-res CT and PFTs in 1 year Follow-up in clinic in 1 year after these tests.

## 2022-09-03 DIAGNOSIS — I700 Atherosclerosis of aorta: Secondary | ICD-10-CM | POA: Diagnosis not present

## 2022-09-03 DIAGNOSIS — R82998 Other abnormal findings in urine: Secondary | ICD-10-CM | POA: Diagnosis not present

## 2022-09-03 DIAGNOSIS — E1169 Type 2 diabetes mellitus with other specified complication: Secondary | ICD-10-CM | POA: Diagnosis not present

## 2022-09-03 DIAGNOSIS — N1832 Chronic kidney disease, stage 3b: Secondary | ICD-10-CM | POA: Diagnosis not present

## 2022-09-03 DIAGNOSIS — E785 Hyperlipidemia, unspecified: Secondary | ICD-10-CM | POA: Diagnosis not present

## 2022-09-03 DIAGNOSIS — J439 Emphysema, unspecified: Secondary | ICD-10-CM | POA: Diagnosis not present

## 2022-09-03 DIAGNOSIS — Z0000 Encounter for general adult medical examination without abnormal findings: Secondary | ICD-10-CM | POA: Diagnosis not present

## 2022-09-03 DIAGNOSIS — J8410 Pulmonary fibrosis, unspecified: Secondary | ICD-10-CM | POA: Diagnosis not present

## 2022-09-03 DIAGNOSIS — M4800 Spinal stenosis, site unspecified: Secondary | ICD-10-CM | POA: Diagnosis not present

## 2022-09-03 DIAGNOSIS — I10 Essential (primary) hypertension: Secondary | ICD-10-CM | POA: Diagnosis not present

## 2022-09-12 ENCOUNTER — Ambulatory Visit: Payer: Medicare PPO | Admitting: Podiatry

## 2022-10-17 DIAGNOSIS — H6122 Impacted cerumen, left ear: Secondary | ICD-10-CM | POA: Diagnosis not present

## 2022-10-17 DIAGNOSIS — Z974 Presence of external hearing-aid: Secondary | ICD-10-CM | POA: Diagnosis not present

## 2022-10-27 ENCOUNTER — Telehealth: Payer: Self-pay | Admitting: Neurology

## 2022-10-27 NOTE — Telephone Encounter (Signed)
Pt is scheduled for botox injection on 11/11/21 and will need a new PA before appointment. Current PA expires 11/09/22

## 2022-10-27 NOTE — Telephone Encounter (Signed)
Chemical Denervation   Botox [id number] Units: 100  Chemical Denervation of Salivary Gland CPT 64611  K11.7 Disturbance of salivary secretion   Re-auth needed

## 2022-10-27 NOTE — Telephone Encounter (Signed)
Confirmed with wife that insurance will not change for 2024.

## 2022-10-30 ENCOUNTER — Other Ambulatory Visit (HOSPITAL_COMMUNITY): Payer: Self-pay

## 2022-10-30 NOTE — Telephone Encounter (Signed)
Patient Advocate Encounter   Received notification that prior authorization for Botox 100UNIT solution is required.   PA submitted on 10/30/2022 Key [id number] Status is pending       Lyndel Safe, [place] Patient Advocate Specialist [place] Patient Advocate Team Direct Number: [phone]  Fax: [phone]

## 2022-11-04 ENCOUNTER — Other Ambulatory Visit (HOSPITAL_COMMUNITY): Payer: Self-pay

## 2022-11-04 NOTE — Telephone Encounter (Signed)
Patient Advocate Encounter  Prior Authorization for Botox 100UNIT solution  has been approved.    PA# [id number] [place] and Medical Benefit PA Form  Effective dates: 10/30/2022 through 11/10/2023  Buy and Elton Sin, [place] Patient Advocate Specialist [place] Patient Advocate Team Direct Number: [phone]  Fax: [phone]

## 2022-11-05 ENCOUNTER — Ambulatory Visit: Payer: Medicare PPO | Admitting: Neurology

## 2022-11-05 NOTE — Telephone Encounter (Signed)
Pt has appt scheduled 11/11/22

## 2022-11-11 ENCOUNTER — Ambulatory Visit: Payer: Medicare PPO | Admitting: Neurology

## 2022-11-11 VITALS — BP 147/77 | HR 67 | Ht 67.0 in | Wt 167.8 lb

## 2022-11-11 DIAGNOSIS — K117 Disturbances of salivary secretion: Secondary | ICD-10-CM

## 2022-11-11 MED ORDER — ONABOTULINUMTOXINA 100 UNITS IJ SOLR: 80.0000 [IU] | Freq: Once | INTRAMUSCULAR | Status: DC

## 2022-11-11 MED ORDER — ONABOTULINUMTOXINA 100 UNITS IJ SOLR
100.0000 [IU] | Freq: Once | INTRAMUSCULAR | Status: AC
  Administered 2022-11-11: 100 [IU] via INTRAMUSCULAR

## 2022-11-11 NOTE — Progress Notes (Signed)
Subjective:    Patient ID: Scott Wang is a 82 y.o. male.  HPI  Scott Wang is an 82 year old right-handed gentleman with an underlying medical history of hypertension, hyperlipidemia, prostate cancer, spinal stenosis, diabetes, and Parkinson's disease, associated with memory loss, orthostatic hypotension, and sialorrhea who presents for his 4th botulinum toxin injection for intractable sialorrhea. The patient is accompanied by his wife again today.  He had his 3rd  injection on 08/11/22, at which time he received 80 units total dose. He felt that the injections thus far had not helped much.      Today, 11/11/2022: He reports no significant improvement from the last injection, no side effects, in particular, no weakness.  He is agreeable to dose increase.  I will increase his dose to 100 units total, 50 units on each side. We mutually agreed to forego further injections in the future, if today's injection does not help.   The original Written informed consent for recurrent, 3 monthly intramuscular injections with botulinum toxin for this indication was obtained on 02/03/2022.    I have previously talked to the patient in detail about expectations, limitations, benefits as well as potential adverse effects of botulinum toxin injections. The patient understands that the side effects include (but are not limited to): Mouth dryness, dryness of eyes, speech and swallowing difficulties, respiratory depression or problems breathing, weakness of muscles including more distant muscles than the ones injected, flu-like symptoms, myalgias, injection site reactions such as redness, itching, swelling, pain, and infection.  Distant effect of toxin has been explained.   100 units of botulinum toxin type A in the form of Botox were reconstituted using preservative-free normal saline to a concentration of 10 units per 0.1 mL and drawn up into 1 mL tuberculin syringes. Lot number and expiration dates as below.     O/E:  BP (!) 147/77   Pulse 67   Ht '5\' 7"'$  (1.702 m)   Wt 167 lb 12.8 oz (76.1 kg)   BMI 26.28 kg/m      The patient was situated in a chair, sitting comfortably. After preparing the areas with 70% isopropyl alcohol and using a 30 gauge 1/2 inch needle for the facial injections, a total dose of 80 units of botulinum toxin type A in the form of Botox was injected into the muscles and the following distribution and quantities:   #1:  50 units in the right parotid gland, 50 units in the left parotid gland.     EMG guidance was not utilized for these injections.  A total dose of 0 units out of 100 units were discarded as unavoidable waste.     The patient tolerated the procedure well without immediate complications. He was advised to make a followup appointment for repeat injections in 3 months from now and encouraged to call us with any interim questions, concerns, problems, or updates. He was in agreement and did not have any questions prior to leaving clinic today.  BOTOX for Sialorrhea.  Consent signed.  Botox- 100 units x 1 vial Lot: [id number] Expiration: 02/2025 NDC: 1962-2297-98   Bacteriostatic 0.9% Sodium Chloride- 93m total Lot: [id number]: 07/12/2023 NDC: [address]-81  Previously:    He called in the interim in late September 2023 indicating that the Botox had not helped.     He had his 2nd Botox injection on 05/22/2022, at which time we increased his dose to a total of 70 units with 35 units injected in each parotid gland.  He had his first injection on 02/03/2022, at which time he received 50 units of Botox total dose divided in both parotid glands.

## 2022-11-11 NOTE — Patient Instructions (Signed)
As discussed, if today's injection does not help at a total dose of 100 units, we will forego further injections.

## 2022-11-24 ENCOUNTER — Other Ambulatory Visit: Payer: Self-pay | Admitting: Neurology

## 2023-01-15 DIAGNOSIS — R3915 Urgency of urination: Secondary | ICD-10-CM | POA: Diagnosis not present

## 2023-01-15 DIAGNOSIS — Z8546 Personal history of malignant neoplasm of prostate: Secondary | ICD-10-CM | POA: Diagnosis not present

## 2023-01-15 DIAGNOSIS — N5201 Erectile dysfunction due to arterial insufficiency: Secondary | ICD-10-CM | POA: Diagnosis not present

## 2023-01-19 DIAGNOSIS — H903 Sensorineural hearing loss, bilateral: Secondary | ICD-10-CM | POA: Diagnosis not present

## 2023-02-03 ENCOUNTER — Ambulatory Visit: Payer: Medicare PPO | Admitting: Neurology

## 2023-02-03 VITALS — BP 158/80 | HR 80 | Ht 67.0 in | Wt 167.8 lb

## 2023-02-03 DIAGNOSIS — K117 Disturbances of salivary secretion: Secondary | ICD-10-CM | POA: Diagnosis not present

## 2023-02-03 MED ORDER — ONABOTULINUMTOXINA 100 UNITS IJ SOLR
100.0000 [IU] | Freq: Once | INTRAMUSCULAR | Status: AC
  Administered 2023-02-03: 100 [IU] via INTRAMUSCULAR

## 2023-02-03 NOTE — Progress Notes (Deleted)
Botox- 100 units x 2 vials Lot: *** Expiration: *** NDC: [id number]  Bacteriostatic 0.9% Sodium Chloride- 4 mL total Lot: *** Expiration: *** NDCUO:[id number]  Dx: ***.*** S/P OR B/B Witnessed by ***

## 2023-02-03 NOTE — Patient Instructions (Signed)
Please remember, botulinum toxin takes about 3-7 days to kick in. As discussed, this is not a pain shot. The purpose of the injections is to gradually improve your symptoms. In some patients it takes up to 2-3 weeks to make a difference and it wears off with time. Sometimes it may wear off before it is time for the next injection. We still should wait till the next 3 monthly injection, because injecting too frequently may cause you to develop immunity to the botulinum toxin. We are looking for a reduction in the severity and/or frequency of your symptoms. As a reminder, side effects to look out for are (but not limited to): mouth dryness, dryness of the eyes, heaviness of your head or muscle weakness, including droopy face or droopy eyelid(s), rarely: speech or swallowing difficulties and very rarely: breathing difficulties. Some people have transient neck pain or soreness which typically responds to over-the-counter anti-inflammatory medication and local heat application with a heat pad. If you think you have a severe reaction to the botulinum toxin, such as weakness, trouble speaking, trouble breathing, or trouble swallowing, you have to call 911 or have someone take you to the nearest emergency room. However, most people have either no or minimal side effects from the injections. It is normal to have a little bit of redness and swelling around the injection sites which usually improves after a few hours. Rarely, there may be a bruise that improves on its own. Most side effects reported are very mild and resolve within 10-14 days. Please feel free to call us if you have any additional questions or concerns: [phone] or email us through My Chart. We may have to adjust the dose over time, depending on your results from this injection and your overall response over time to this medication.   

## 2023-02-03 NOTE — Progress Notes (Signed)
Subjective:    Patient ID: Scott Wang is a 82 y.o. male.  HPI  Scott Wang is an 82 year old right-handed gentleman with an underlying medical history of hypertension, hyperlipidemia, prostate cancer, spinal stenosis, diabetes, and Parkinson's disease, associated with memory loss, orthostatic hypotension, and sialorrhea who presents for his 5th botulinum toxin injection for intractable sialorrhea in the context of Parkinson's disease.  The patient is unaccompanied today.  He had his fourth injection on 11/11/2022, at which time he reported no significant improvement from the previous injection and we increased his total dose to 100 units, 50 units in each parotid gland.     Today, 02/03/2023: He reports doing fairly well, the increased dose and Botox was more effective, no side effects, drooling fluctuates on a day-to-day basis but generally he feels that the last injection was helpful.  He would like to proceed with injection today.   The original written informed consent for recurrent, 3 monthly intramuscular injections with botulinum toxin for this indication was obtained on 02/03/2022. Today's consent signed.    I have previously talked to the patient in detail about expectations, limitations, benefits as well as potential adverse effects of botulinum toxin injections. The patient understands that the side effects include (but are not limited to): Mouth dryness, dryness of eyes, speech and swallowing difficulties, respiratory depression or problems breathing, weakness of muscles including more distant muscles than the ones injected, flu-like symptoms, myalgias, injection site reactions such as redness, itching, swelling, pain, and infection.  Distant effect of toxin has been explained.   100 units of botulinum toxin type A in the form of Botox were reconstituted using preservative-free normal saline to a concentration of 10 units per 0.1 mL and drawn up into 1 mL tuberculin syringes. Lot number and  expiration dates as below.     O/E: BP (!) 158/80   Pulse 80   Ht 5\' 7"  (1.702 m)   Wt 167 lb 12.8 oz (76.1 kg)   BMI 26.28 kg/m     The patient was situated in a chair, sitting comfortably. After preparing the areas with 70% isopropyl alcohol and using a 30 gauge 1/2 inch needle for the facial injections, a total dose of 80 units of botulinum toxin type A in the form of Botox was injected into the muscles and the following distribution and quantities:   #1:  50 units in the right parotid gland, 50 units in the left parotid gland.     EMG guidance was not utilized for these injections.  A total dose of 0 units out of 100 units were discarded as unavoidable waste.     The patient tolerated the procedure well without immediate complications. He was advised to make a followup appointment for repeat injections in 3 months from now and encouraged to call us with any interim questions, concerns, problems, or updates. He was in agreement and did not have any questions prior to leaving clinic today.     Previously (copied from previous notes for reference):     He had his 3rd  injection on 08/11/22, at which time he received 80 units total dose. He felt that the injections thus far had not helped much.     He called in the interim in late September 2023 indicating that the Botox had not helped.      He had his 2nd Botox injection on 05/22/2022, at which time we increased his dose to a total of 70 units with 35 units injected in  each parotid gland.    He had his first injection on 02/03/2022, at which time he received 50 units of Botox total dose divided in both parotid glands.    Review of Systems  Neurological:        Botox- 100 units x 1 vials Lot: [id number] Expiration: 03/2025 NDC: [id number]  Bacteriostatic 0.9% Sodium Chloride- 4 mL total Lot: [id number] Expiration: 11/25 NDC: [id number]  Dx: K11.7.  B/B Witnessed by Skeet Simmer CMA

## 2023-02-24 DIAGNOSIS — N390 Urinary tract infection, site not specified: Secondary | ICD-10-CM | POA: Diagnosis not present

## 2023-02-26 DIAGNOSIS — C61 Malignant neoplasm of prostate: Secondary | ICD-10-CM | POA: Diagnosis not present

## 2023-02-26 DIAGNOSIS — R310 Gross hematuria: Secondary | ICD-10-CM | POA: Diagnosis not present

## 2023-03-27 DIAGNOSIS — C649 Malignant neoplasm of unspecified kidney, except renal pelvis: Secondary | ICD-10-CM | POA: Diagnosis not present

## 2023-03-27 DIAGNOSIS — R310 Gross hematuria: Secondary | ICD-10-CM | POA: Diagnosis not present

## 2023-03-28 DIAGNOSIS — M48062 Spinal stenosis, lumbar region with neurogenic claudication: Secondary | ICD-10-CM | POA: Diagnosis not present

## 2023-03-28 DIAGNOSIS — M5416 Radiculopathy, lumbar region: Secondary | ICD-10-CM | POA: Diagnosis not present

## 2023-04-20 ENCOUNTER — Telehealth: Payer: Self-pay

## 2023-04-20 MED ORDER — CARBIDOPA-LEVODOPA 25-100 MG PO TABS: 1.0000 | ORAL_TABLET | Freq: Three times a day (TID) | ORAL | 2 refills | Status: DC

## 2023-04-20 NOTE — Telephone Encounter (Signed)
Patient is requesting a refill of carbidopa-levodopa. He has been taking it 3 times daily (ok per last OV note). Rx changed to reflect current dosage. Refill sent.

## 2023-05-04 ENCOUNTER — Ambulatory Visit: Payer: Medicare PPO | Admitting: Neurology

## 2023-05-21 DIAGNOSIS — R310 Gross hematuria: Secondary | ICD-10-CM | POA: Diagnosis not present

## 2023-05-21 DIAGNOSIS — R8289 Other abnormal findings on cytological and histological examination of urine: Secondary | ICD-10-CM | POA: Diagnosis not present

## 2023-05-28 ENCOUNTER — Other Ambulatory Visit: Payer: Self-pay | Admitting: Urology

## 2023-05-28 DIAGNOSIS — D4101 Neoplasm of uncertain behavior of right kidney: Secondary | ICD-10-CM

## 2023-06-29 ENCOUNTER — Encounter: Payer: Self-pay | Admitting: Pulmonary Disease

## 2023-07-02 ENCOUNTER — Ambulatory Visit
Admission: RE | Admit: 2023-07-02 | Discharge: 2023-07-02 | Disposition: A | Discharge status: Home / Self Care | Payer: Medicare PPO | Source: Ambulatory Visit | Attending: Urology | Admitting: Urology

## 2023-07-02 DIAGNOSIS — D4101 Neoplasm of uncertain behavior of right kidney: Secondary | ICD-10-CM | POA: Diagnosis not present

## 2023-07-02 MED ORDER — GADOPICLENOL 0.5 MMOL/ML IV SOLN
8.0000 mL | Freq: Once | INTRAVENOUS | Status: AC | PRN
  Administered 2023-07-02: 8 mL via INTRAVENOUS

## 2023-07-04 ENCOUNTER — Other Ambulatory Visit: Payer: Medicare PPO

## 2023-07-16 ENCOUNTER — Other Ambulatory Visit: Payer: Self-pay | Admitting: Neurology

## 2023-07-16 NOTE — Telephone Encounter (Signed)
Rx refilled per last office visit note. Patient needs to schedule a follow up for further refills. Note added to pharmacy.

## 2023-07-19 DIAGNOSIS — M47816 Spondylosis without myelopathy or radiculopathy, lumbar region: Secondary | ICD-10-CM | POA: Diagnosis not present

## 2023-07-19 DIAGNOSIS — M5416 Radiculopathy, lumbar region: Secondary | ICD-10-CM | POA: Diagnosis not present

## 2023-07-22 ENCOUNTER — Encounter: Payer: Self-pay | Admitting: Pulmonary Disease

## 2023-07-30 DIAGNOSIS — H524 Presbyopia: Secondary | ICD-10-CM | POA: Diagnosis not present

## 2023-07-30 DIAGNOSIS — E119 Type 2 diabetes mellitus without complications: Secondary | ICD-10-CM | POA: Diagnosis not present

## 2023-07-30 DIAGNOSIS — H04123 Dry eye syndrome of bilateral lacrimal glands: Secondary | ICD-10-CM | POA: Diagnosis not present

## 2023-07-30 DIAGNOSIS — Z961 Presence of intraocular lens: Secondary | ICD-10-CM | POA: Diagnosis not present

## 2023-08-03 ENCOUNTER — Other Ambulatory Visit: Payer: Medicare PPO

## 2023-08-06 ENCOUNTER — Ambulatory Visit: Payer: Medicare PPO | Attending: Physical Medicine and Rehabilitation | Admitting: Physical Therapy

## 2023-08-06 ENCOUNTER — Other Ambulatory Visit: Payer: Self-pay

## 2023-08-06 ENCOUNTER — Encounter: Payer: Self-pay | Admitting: Physical Therapy

## 2023-08-06 DIAGNOSIS — R29818 Other symptoms and signs involving the nervous system: Secondary | ICD-10-CM | POA: Insufficient documentation

## 2023-08-06 DIAGNOSIS — M6281 Muscle weakness (generalized): Secondary | ICD-10-CM | POA: Insufficient documentation

## 2023-08-06 DIAGNOSIS — R2689 Other abnormalities of gait and mobility: Secondary | ICD-10-CM | POA: Diagnosis not present

## 2023-08-06 DIAGNOSIS — R2681 Unsteadiness on feet: Secondary | ICD-10-CM | POA: Insufficient documentation

## 2023-08-10 DIAGNOSIS — H6123 Impacted cerumen, bilateral: Secondary | ICD-10-CM | POA: Diagnosis not present

## 2023-08-11 NOTE — Therapy (Signed)
OUTPATIENT PHYSICAL THERAPY NEURO TREATMENT   Patient Name: Scott Wang MRN: [id number] DOB:January 21, 1941, 82 y.o., male Today's Date: 08/12/2023   PCP: [name], DO REFERRING PROVIDER: Sheran Luz, MD   END OF SESSION:  PT End of Session - 08/12/23 1215     Visit Number 2    Number of Visits 17    Date for PT Re-Evaluation 10/02/23    Authorization Type [employer]    Authorization Time Period approved 17 PT visits from 08/06/2023 - 10/02/2023    Authorization - Visit Number 2    Authorization - Number of Visits 17    PT Start Time 1218    PT Stop Time 1257    PT Time Calculation (min) 39 min    Equipment Utilized During Treatment Gait belt    Activity Tolerance Patient tolerated treatment well    Behavior During Therapy WFL for tasks assessed/performed              Past Medical History:  Diagnosis Date   Carpal tunnel syndrome    bilateral, had shots in each one and "haven't had any problems since".   Diabetes mellitus    Hyperlipidemia    Hypertension    Prostate cancer (HCC)    Spinal stenosis    Past Surgical History:  Procedure Laterality Date   HERNIA REPAIR     HERNIA REPAIR     INGUINAL HERNIA REPAIR     KNEE SURGERY  right   KNEE SURGERY Left    PROSTATE BIOPSY     RADIOACTIVE SEED IMPLANT N/A 09/07/2018   Procedure: RADIOACTIVE SEED IMPLANT/BRACHYTHERAPY IMPLANT;  Surgeon: Jerilee Field, MD;  Location: [facility];  Service: Urology;  Laterality: N/A;   SPACE OAR INSTILLATION N/A 09/07/2018   Procedure: SPACE OAR INSTILLATION;  Surgeon: Jerilee Field, MD;  Location[facility];  Service: Urology;  Laterality: N/A;   Patient Active Problem List   Diagnosis Date Noted   Orthostatic hypotension 11/20/2021   not drinking enough fluids 11/20/2021   Imbalance 11/20/2021   Memory loss 11/20/2021   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   Muscle stiffness due to parkinson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;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   Parkinson's disease (HCC) 12/02/2020   Malignant neoplasm of prostate (HCC) 05/05/2018   Hypertension 06/10/2011   Hyperlipidemia 06/10/2011   Diabetes mellitus (HCC) 06/10/2011   Onychomycosis 06/10/2011   Paronychia 06/10/2011    ONSET DATE: 07/24/2023  REFERRING DIAG: R26.9 (ICD-10-CM) - Unspecified abnormalities of gait and mobility   THERAPY DIAG:  Unsteadiness on feet  Other abnormalities of gait and mobility  Muscle weakness (generalized)  Other symptoms and signs involving the nervous system  Rationale for Evaluation and Treatment: Rehabilitation  SUBJECTIVE:  SUBJECTIVE STATEMENT: No new issues.  Pt accompanied by: self  PERTINENT HISTORY: bilateral leg pain, severe spinal stenosis L4-5 and L3-4, previously evaluated by Dr. Shon Baton  Parkinson's; Right hip pain with range of motion   PAIN:  Are you having pain? No  PRECAUTIONS: Fall  RED FLAGS: None   WEIGHT BEARING RESTRICTIONS: No  FALLS: Has patient fallen in last 6 months? Yes. Number of falls 3 at least Last fall was yesterday LIVING ENVIRONMENT: Lives with: lives with their spouse Lives in: House/apartment Stairs: Yes: External: 3 steps; on right going up Has following equipment at home: Single point cane, Walker - 2 wheeled, and walking pole Has stationary bike at home  PLOF: Independent and Leisure: works in garden, walks around home  PATIENT GOALS: Get over some of the balance issues  OBJECTIVE:      TODAY'S TREATMENT: 08/12/23 Activity Comments  STS without UE support 10x Cueing to increased anterior trunk lean to reduced posterior LOB  sitting PWR! Moves performed with demo and cueing  PWR! Up 10x  PWR! Rock 10x  PWR! Twist 10x  PWR! Step  10x  Some difficulty with trunk and hip rotation- isolated this before proceeding with PWR twist.some difficulty with set switching, requiring correction. Limited sitting balance when stretching LE out with PWR rock, this part was omitted.   1/4 and 1/2 turns at counter top Chair for support; cues for fewer, larger, more efficient steps; good carryover     HOME EXERCISE PROGRAM Last updated: 08/12/23 Access Code: [email or web address] URL: [email or web address] Date: 08/12/2023 Prepared by: [facility] - [facility]  Exercises - Sit to Stand with Arms Crossed  - 1 x daily - 5 x weekly - 2 sets - 10 reps  ALSO: Sitting PWR moves 10x each per day   PATIENT EDUCATION: Education details: HEP and sitting PWR moves handout; edu on possible aggravating factors for freezing episodes, encouraged pt to think about possible ways to maintain activity levels throughout the winter  Person educated: Patient Education method: Explanation, Demonstration, Tactile cues, Verbal cues, and Handouts Education comprehension: verbalized understanding and returned demonstration    Below measures were taken at time of initial evaluation unless otherwise specified:   DIAGNOSTIC FINDINGS: NT  COGNITION: Overall cognitive status: Within functional limits for tasks assessed and difficulty with dual task cognitive activity with gait    POSTURE: rounded shoulders, forward head, posterior pelvic tilt, and flexed trunk   LOWER EXTREMITY ROM:   WFL BLEs  LOWER EXTREMITY MMT:    MMT Right Eval Left Eval  Hip flexion 4 4  Hip extension    Hip abduction    Hip adduction    Hip internal rotation    Hip external rotation    Knee flexion 4 4  Knee extension 4 4  Ankle dorsiflexion 4 4  Ankle plantarflexion    Ankle inversion    Ankle eversion    (Blank rows = not tested)   TRANSFERS: Assistive device utilized: None  Sit to stand: Modified independence Stand to sit:  Modified independence   GAIT: Gait pattern: step through pattern, decreased arm swing- Right, decreased arm swing- Left, decreased stride length, trunk flexed, and narrow BOS Distance walked: 50 ft Assistive device utilized: None Level of assistance: SBA  FUNCTIONAL TESTS:  5 times sit to stand: 21 sec Timed up and go (TUG): 14.84 10 meter walk test: 16.22 sec (2.02 ft/sec) 360 turn to R :  15 steps, 7.16 sec 360 turn  to L:  13 steps, 6.63 sec Posterior push and release: multiple small steps, would fall if unaided Forward push and release:  2 small steps to recover TUG cognitive:  16.19 sec (did not count correctly) TUG manual:  16.31sec Backwards 3 M test: 12.91 sec with small steps, decreased foot clearance  Smooth pursuits WFL  TODAY'S TREATMENT:                                                                                                                              DATE: 08/06/2023    PATIENT EDUCATION: Education details: Eval results, POC; discussed importance of consistently taking PD meds and f/u with neurologist (last seen by Butch Penny, NP, 09/2022) Person educated: Patient and Spouse Education method: Explanation Education comprehension: verbalized understanding  HOME EXERCISE PROGRAM: Not yet initiated  GOALS: Goals reviewed with patient? Yes  SHORT TERM GOALS: Target date: 09/04/2023  Pt will be independent with HEP for improved balance, strength, gait. Baseline: Goal status: INITIAL  2.  Pt will improve 5x sit<>stand to less than or equal to 15 sec to demonstrate improved functional strength and transfer efficiency. Baseline: 21 sec Goal status: INITIAL  3.  Pt will improve TUG score to less than or equal to 13.5 sec for decreased fall risk. Baseline: 14.84 sec Goal status: INITIAL  4.  Pt will verbalize understanding of local Parkinson's disease community resources.  Baseline:  Goal status: INITIAL  5.  Pt will verbalize/demo tips to reduce  festination/freezing with turns and gait.  Baseline:  Goal status:  INITIAL   LONG TERM GOALS: Target date: 10/02/2023  Pt will be independent with HEP for improved balance, strength, gait. Baseline:  Goal status: INITIAL  2.  Pt will improve gait velocity to at least 2.62 ft/sec for improved gait efficiency and safety. Baseline: 2.02 ft/sec Goal status: INITIAL  3.  Pt will improve 360 turn to 8 steps or less in each direction, to improve turns and decrease fall risk. Baseline: 13-15 steps Goal status: INITIAL  4.  Pt will improve 67M walk backwards to <10 seconds and no LOB. Baseline:  Goal status: INITIAL  5.  Pt will perform posterior push and release in 2 steps or less for improved balance recovery in posterior direction. Baseline:  Goal status: INITIAL  ASSESSMENT:  CLINICAL IMPRESSION: Patient arrived to session without complaints. Cueing provided with STS transfers to increase anterior trunk lean for improved stability upon standing. Initiated seated PWR moves with demo and cueing for larger amplitude, proper alignment and coordination of movements. Patient initially with multiple small steps to perform turns but with good ability to correct according to cueing. Provided edu at end of session on possible freezing triggers and encouraged pt to consider exercise classes or other activities to maintain fitness throughout the winter as he admits to more sedentary lifestyle during this time of year. No complaints upon leaving.  OBJECTIVE IMPAIRMENTS: Abnormal gait, decreased balance, decreased mobility, difficulty  walking, decreased strength, and postural dysfunction.   ACTIVITY LIMITATIONS: standing, transfers, and locomotion level  PARTICIPATION LIMITATIONS: shopping, community activity, and yard work  PERSONAL FACTORS: 3+ comorbidities: see above  are also affecting patient's functional outcome.   REHAB POTENTIAL: Good  CLINICAL DECISION MAKING: Evolving/moderate  complexity  EVALUATION COMPLEXITY: Moderate  PLAN:  PT FREQUENCY: 2x/week  PT DURATION: 8 weeks plus eval  PLANNED INTERVENTIONS: Therapeutic exercises, Therapeutic activity, Neuromuscular re-education, Balance training, Gait training, Patient/Family education, Self Care, and Manual therapy  PLAN FOR NEXT SESSION: review HEP; edu on PD community resources and freezing handout;  marching, turning practice; work on balance in posterior direction.  Would likely benefit from [facility] Moves    [place], [place], DPT 08/12/23 1:01 PM  [facility] at [facility] [address] [place], [address] [place], [place] [address] Phone # [phone] Fax # [phone]

## 2023-08-12 ENCOUNTER — Ambulatory Visit: Payer: Medicare PPO | Attending: Physical Medicine and Rehabilitation | Admitting: Physical Therapy

## 2023-08-12 ENCOUNTER — Encounter: Payer: Self-pay | Admitting: Physical Therapy

## 2023-08-12 DIAGNOSIS — M6281 Muscle weakness (generalized): Secondary | ICD-10-CM | POA: Diagnosis not present

## 2023-08-12 DIAGNOSIS — R2689 Other abnormalities of gait and mobility: Secondary | ICD-10-CM | POA: Insufficient documentation

## 2023-08-12 DIAGNOSIS — R29818 Other symptoms and signs involving the nervous system: Secondary | ICD-10-CM | POA: Diagnosis not present

## 2023-08-12 DIAGNOSIS — R2681 Unsteadiness on feet: Secondary | ICD-10-CM | POA: Diagnosis not present

## 2023-08-14 ENCOUNTER — Encounter: Payer: Self-pay | Admitting: Physical Therapy

## 2023-08-14 ENCOUNTER — Ambulatory Visit: Payer: Medicare PPO | Admitting: Physical Therapy

## 2023-08-14 DIAGNOSIS — R29818 Other symptoms and signs involving the nervous system: Secondary | ICD-10-CM

## 2023-08-14 DIAGNOSIS — R2681 Unsteadiness on feet: Secondary | ICD-10-CM

## 2023-08-14 DIAGNOSIS — M6281 Muscle weakness (generalized): Secondary | ICD-10-CM

## 2023-08-14 DIAGNOSIS — R2689 Other abnormalities of gait and mobility: Secondary | ICD-10-CM | POA: Diagnosis not present

## 2023-08-14 NOTE — Therapy (Signed)
OUTPATIENT PHYSICAL THERAPY NEURO TREATMENT   Patient Name: Scott Wang MRN: [id number] DOB:19-Aug-1941, 82 y.o., male Today's Date: 08/14/2023   PCP: [name], DO REFERRING PROVIDER: Sheran Luz, MD   END OF SESSION:  PT End of Session - 08/14/23 0852     Visit Number 3    Number of Visits 17    Date for PT Re-Evaluation 10/02/23    Authorization Type [employer]    Authorization Time Period approved 17 PT visits from 08/06/2023 - 10/02/2023    Authorization - Visit Number 3    Authorization - Number of Visits 17    PT Start Time 0850    PT Stop Time 0928    PT Time Calculation (min) 38 min    Equipment Utilized During Treatment Gait belt    Activity Tolerance Patient tolerated treatment well    Behavior During Therapy WFL for tasks assessed/performed               Past Medical History:  Diagnosis Date   Carpal tunnel syndrome    bilateral, had shots in each one and "haven't had any problems since".   Diabetes mellitus    Hyperlipidemia    Hypertension    Prostate cancer (HCC)    Spinal stenosis    Past Surgical History:  Procedure Laterality Date   HERNIA REPAIR     HERNIA REPAIR     INGUINAL HERNIA REPAIR     KNEE SURGERY  right   KNEE SURGERY Left    PROSTATE BIOPSY     RADIOACTIVE SEED IMPLANT N/A 09/07/2018   Procedure: RADIOACTIVE SEED IMPLANT/BRACHYTHERAPY IMPLANT;  Surgeon: Jerilee Field, MD;  Location: [facility];  Service: Urology;  Laterality: N/A;   SPACE OAR INSTILLATION N/A 09/07/2018   Procedure: SPACE OAR INSTILLATION;  Surgeon: Jerilee Field, MD;  Location: [facility];  Service: Urology;  Laterality: N/A;   Patient Active Problem List   Diagnosis Date Noted   Orthostatic hypotension 11/20/2021   not drinking enough fluids 11/20/2021   Imbalance 11/20/2021   Memory loss 11/20/2021   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   Muscle stiffness due to parkinson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;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   Parkinson's disease (HCC) 12/02/2020   Malignant neoplasm of prostate (HCC) 05/05/2018   Hypertension 06/10/2011   Hyperlipidemia 06/10/2011   Diabetes mellitus (HCC) 06/10/2011   Onychomycosis 06/10/2011   Paronychia 06/10/2011    ONSET DATE: 07/24/2023  REFERRING DIAG: R26.9 (ICD-10-CM) - Unspecified abnormalities of gait and mobility   THERAPY DIAG:  Unsteadiness on feet  Other abnormalities of gait and mobility  Muscle weakness (generalized)  Other symptoms and signs involving the nervous system  Rationale for Evaluation and Treatment: Rehabilitation  SUBJECTIVE:  SUBJECTIVE STATEMENT: No changes since last visit.  Did a few of my exercises and helped wife clean house yesterday.  Pt accompanied by: self  PERTINENT HISTORY: bilateral leg pain, severe spinal stenosis L4-5 and L3-4, previously evaluated by Dr. Shon Baton  Parkinson's; Right hip pain with range of motion   PAIN:  Are you having pain? No  PRECAUTIONS: Fall  RED FLAGS: None   WEIGHT BEARING RESTRICTIONS: No  FALLS: Has patient fallen in last 6 months? Yes. Number of falls 3 at least Last fall was yesterday LIVING ENVIRONMENT: Lives with: lives with their spouse Lives in: House/apartment Stairs: Yes: External: 3 steps; on right going up Has following equipment at home: Single point cane, Walker - 2 wheeled, and walking pole Has stationary bike at home  PLOF: Independent and Leisure: works in garden, walks around home  PATIENT GOALS: Get over some of the balance issues  OBJECTIVE:    TODAY'S TREATMENT: 08/14/2023 Activity Comments  NuStep, Level 2, 4 extremities x 6 minutes for aerobic warm up Initial 60-65 SPM, increases to >80 with cues  Sit<>stand arms  crossed, 2 x 5, reps from 20" mat Review of HEP, good return demo  sitting PWR! Moves performed with demo and cueing  PWR! Up 10x  PWR! Rock 10x  PWR! Twist 10x (modified with looking over shoulder and reach to start), then PWR! Twist with clap  PWR! Step 10x Needs max cues throughout for large amplitude movement patterns  Gait 50 ft x 4 reps with cues on posture and arm swing Increased tremor LUE   Standing heel toe raises 2 x 10   Forward step over obstacle, 2 x 5 reps each leg          HOME EXERCISE PROGRAM Last updated: 08/12/23 Access Code: [email or web address] URL: [email or web address] Date: 08/12/2023 Prepared by: [facility] - Outpatient  Rehab - [facility]  Exercises - Sit to Stand with Arms Crossed  - 1 x daily - 5 x weekly - 2 sets - 10 reps  ALSO: Sitting PWR moves 10x each per day   PATIENT EDUCATION: Education details: Review of sitting PWR moves handout and sit<>stand HEP Person educated: Patient Education method: Explanation, Demonstration, Tactile cues, Verbal cues, and Handouts Education comprehension: verbalized understanding and returned demonstration    Below measures were taken at time of initial evaluation unless otherwise specified:   DIAGNOSTIC FINDINGS: NT  COGNITION: Overall cognitive status: Within functional limits for tasks assessed and difficulty with dual task cognitive activity with gait    POSTURE: rounded shoulders, forward head, posterior pelvic tilt, and flexed trunk   LOWER EXTREMITY ROM:   WFL BLEs  LOWER EXTREMITY MMT:    MMT Right Eval Left Eval  Hip flexion 4 4  Hip extension    Hip abduction    Hip adduction    Hip internal rotation    Hip external rotation    Knee flexion 4 4  Knee extension 4 4  Ankle dorsiflexion 4 4  Ankle plantarflexion    Ankle inversion    Ankle eversion    (Blank rows = not tested)   TRANSFERS: Assistive device utilized: None  Sit to stand: Modified  independence Stand to sit: Modified independence   GAIT: Gait pattern: step through pattern, decreased arm swing- Right, decreased arm swing- Left, decreased stride length, trunk flexed, and narrow BOS Distance walked: 50 ft Assistive device utilized: None Level of assistance: SBA  FUNCTIONAL TESTS:  5 times sit to stand: 21 sec Timed  up and go (TUG): 14.84 10 meter walk test: 16.22 sec (2.02 ft/sec) 360 turn to R :  15 steps, 7.16 sec 360 turn to L:  13 steps, 6.63 sec Posterior push and release: multiple small steps, would fall if unaided Forward push and release:  2 small steps to recover TUG cognitive:  16.19 sec (did not count correctly) TUG manual:  16.31sec Backwards 3 M test: 12.91 sec with small steps, decreased foot clearance  Smooth pursuits WFL  TODAY'S TREATMENT:                                                                                                                              DATE: 08/06/2023    PATIENT EDUCATION: Education details: Eval results, POC; discussed importance of consistently taking PD meds and f/u with neurologist (last seen by Butch Penny, NP, 09/2022) Person educated: Patient and Spouse Education method: Explanation Education comprehension: verbalized understanding  HOME EXERCISE PROGRAM: Not yet initiated  GOALS: Goals reviewed with patient? Yes  SHORT TERM GOALS: Target date: 09/04/2023  Pt will be independent with HEP for improved balance, strength, gait. Baseline: Goal status: IN PROGRESS  2.  Pt will improve 5x sit<>stand to less than or equal to 15 sec to demonstrate improved functional strength and transfer efficiency. Baseline: 21 sec Goal status: IN PROGRESS  3.  Pt will improve TUG score to less than or equal to 13.5 sec for decreased fall risk. Baseline: 14.84 sec Goal status: IN PROGRESS  4.  Pt will verbalize understanding of local Parkinson's disease community resources.  Baseline:  Goal status: IN  PROGRESS  5.  Pt will verbalize/demo tips to reduce festination/freezing with turns and gait.  Baseline:  Goal status:  IN PROGRESS   LONG TERM GOALS: Target date: 10/02/2023  Pt will be independent with HEP for improved balance, strength, gait. Baseline:  Goal status: IN PROGRESS  2.  Pt will improve gait velocity to at least 2.62 ft/sec for improved gait efficiency and safety. Baseline: 2.02 ft/sec Goal status: IN PROGRESS  3.  Pt will improve 360 turn to 8 steps or less in each direction, to improve turns and decrease fall risk. Baseline: 13-15 steps Goal status: IN PROGRESS  4.  Pt will improve 26M walk backwards to <10 seconds and no LOB. Baseline:  Goal status: IN PROGRESS  5.  Pt will perform posterior push and release in 2 steps or less for improved balance recovery in posterior direction. Baseline:  Goal status: IN PROGRESS  ASSESSMENT:  CLINICAL IMPRESSION: Pt arrives today and started session with aerobic warm up with Nu-step, to work on increased intensity of movement patterns.  Self-selected pace on Nustep is 60-65 SPM and with cues, he is able to increase pace to 80 SPM, needing frequent cues.  He does rate effort level as 7-8/10.  Reviewed sit<>Stand and seated PWR! Moves, with pt return demo understanding, continueing to need frequent cues for PWR! Moves.  He  will continue to benefit from skilled PT towards goals for improved balance and gait.   OBJECTIVE IMPAIRMENTS: Abnormal gait, decreased balance, decreased mobility, difficulty walking, decreased strength, and postural dysfunction.   ACTIVITY LIMITATIONS: standing, transfers, and locomotion level  PARTICIPATION LIMITATIONS: shopping, community activity, and yard work  PERSONAL FACTORS: 3+ comorbidities: see above  are also affecting patient's functional outcome.   REHAB POTENTIAL: Good  CLINICAL DECISION MAKING: Evolving/moderate complexity  EVALUATION COMPLEXITY: Moderate  PLAN:  PT FREQUENCY:  2x/week  PT DURATION: 8 weeks plus eval  PLANNED INTERVENTIONS: Therapeutic exercises, Therapeutic activity, Neuromuscular re-education, Balance training, Gait training, Patient/Family education, Self Care, and Manual therapy  PLAN FOR NEXT SESSION: Follow up on education on PD community resources and freezing handout;  marching, turning practice; work on balance in posterior direction.  Would likely benefit from [facility]! Moves-try standing.      Lonia Blood, PT 08/14/23 11:47 AM Phone: [phone] Fax: [phone]   [facility] Outpatient Rehab at [facility] [address] [place], [address] [place], [place] [address] Phone # [phone] Fax # [phone]

## 2023-08-17 ENCOUNTER — Ambulatory Visit: Payer: Medicare PPO | Admitting: Physical Therapy

## 2023-08-17 ENCOUNTER — Encounter: Payer: Self-pay | Admitting: Physical Therapy

## 2023-08-17 DIAGNOSIS — R2689 Other abnormalities of gait and mobility: Secondary | ICD-10-CM | POA: Diagnosis not present

## 2023-08-17 DIAGNOSIS — M6281 Muscle weakness (generalized): Secondary | ICD-10-CM | POA: Diagnosis not present

## 2023-08-17 DIAGNOSIS — R2681 Unsteadiness on feet: Secondary | ICD-10-CM | POA: Diagnosis not present

## 2023-08-17 DIAGNOSIS — R29818 Other symptoms and signs involving the nervous system: Secondary | ICD-10-CM | POA: Diagnosis not present

## 2023-08-17 NOTE — Therapy (Signed)
OUTPATIENT PHYSICAL THERAPY NEURO TREATMENT   Patient Name: Scott Wang MRN: [id number] DOB:1941/05/08, 82 y.o., male Today's Date: 08/17/2023   PCP: [name], DO REFERRING PROVIDER: Sheran Luz, MD   END OF SESSION:  PT End of Session - 08/17/23 1105     Visit Number 4    Number of Visits 17    Date for PT Re-Evaluation 10/02/23    Authorization Type [employer]    Authorization Time Period approved 17 PT visits from 08/06/2023 - 10/02/2023    Authorization - Visit Number 4    Authorization - Number of Visits 17    PT Start Time 1103    PT Stop Time 1141    PT Time Calculation (min) 38 min    Equipment Utilized During Treatment Gait belt    Activity Tolerance Patient tolerated treatment well    Behavior During Therapy WFL for tasks assessed/performed                Past Medical History:  Diagnosis Date   Carpal tunnel syndrome    bilateral, had shots in each one and "haven't had any problems since".   Diabetes mellitus    Hyperlipidemia    Hypertension    Prostate cancer (HCC)    Spinal stenosis    Past Surgical History:  Procedure Laterality Date   HERNIA REPAIR     HERNIA REPAIR     INGUINAL HERNIA REPAIR     KNEE SURGERY  right   KNEE SURGERY Left    PROSTATE BIOPSY     RADIOACTIVE SEED IMPLANT N/A 09/07/2018   Procedure: RADIOACTIVE SEED IMPLANT/BRACHYTHERAPY IMPLANT;  Surgeon: Jerilee Field, MD;  Location: [facility];  Service: Urology;  Laterality: N/A;   SPACE OAR INSTILLATION N/A 09/07/2018   Procedure: SPACE OAR INSTILLATION;  Surgeon: Jerilee Field, MD;  Location: [facility];  Service: Urology;  Laterality: N/A;   Patient Active Problem List   Diagnosis Date Noted   Orthostatic hypotension 11/20/2021   not drinking enough fluids 11/20/2021   Imbalance 11/20/2021   Memory loss 11/20/2021   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   Muscle stiffness due to parkinsons disease  11/20/2021   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  Found out that my niece passed away in her sleep.  Tried to do some yard work with the [employer] and I backed up and fell on my bottom.  Did not get hurt and was able to get up on my own.  Don't do well in the backwards direction.  Pt accompanied by: self  PERTINENT HISTORY: bilateral leg pain, severe spinal stenosis L4-5 and L3-4, previously evaluated by Dr. Shon Baton  Parkinson's; Right hip pain with range of motion   PAIN:  Are you having pain? No  PRECAUTIONS: Fall  RED FLAGS: None   WEIGHT BEARING RESTRICTIONS: No  FALLS: Has patient fallen in last 6 months? Yes. Number of falls 3 at least Last fall was yesterday LIVING ENVIRONMENT: Lives with: lives with their spouse Lives in: House/apartment Stairs: Yes: External: 3 steps; on right going up Has following equipment at home: Single point cane, Walker - 2 wheeled, and walking pole Has stationary bike at home  PLOF: Independent and Leisure: works in garden, walks around home  PATIENT GOALS: Get over some of the balance issues  OBJECTIVE:    TODAY'S TREATMENT: 08/17/2023 Activity Comments  NuStep,  Level 3, 4 extremities x 8 minutes, for aerobic warm up Keeps SPM 75-80, cues for >80 for increased intensity; 30 sec bouts >90 (x 3 reps)  Sit<>stand 3 x 5 reps from mat surface   Forward>back step and weightshift, 10-15 reps   Heel toe raises 2 x 10   Back step and weightshift Tactile cues for hinge at hips to lessen posterior lean  Forward/back walking at counter 5 reps with frequent cues for larger, slowed pace in backwards direction  Minisquat to upright stand, 10 reps, then minisquat to up on toes x 10 At counter         HOME EXERCISE PROGRAM Last updated: 08/12/23 Access Code: [email or web address] URL: [email or web address] Date: 08/12/2023 Prepared by: [facility] - Outpatient  Rehab - [facility]  Exercises - Sit to Stand with Arms Crossed  - 1 x daily - 5 x weekly - 2 sets - 10 reps  ALSO: Sitting PWR moves 10x each per day   PATIENT EDUCATION: Education details: Slowed pace, hinge at hips, slightly larger steps in posterior direction; reminded pt of importance of consistently taking PD medication to help decreased (pt-reported) freezing episodes Person educated: Patient Education method: Explanation, Demonstration, Tactile cues, Verbal cues, and Handouts Education comprehension: verbalized understanding and returned demonstration    Below measures were taken at time of initial evaluation unless otherwise specified:   DIAGNOSTIC FINDINGS: NT  COGNITION: Overall cognitive status: Within functional limits for tasks assessed and difficulty with dual task cognitive activity with gait    POSTURE: rounded shoulders, forward head, posterior pelvic tilt, and flexed trunk   LOWER EXTREMITY ROM:   WFL BLEs  LOWER EXTREMITY MMT:    MMT Right Eval Left Eval  Hip flexion 4 4  Hip extension    Hip abduction    Hip adduction    Hip internal rotation    Hip external rotation    Knee flexion 4 4  Knee extension 4 4  Ankle dorsiflexion 4 4  Ankle  plantarflexion    Ankle inversion    Ankle eversion    (Blank rows = not tested)   TRANSFERS: Assistive device utilized: None  Sit to stand: Modified independence Stand to sit: Modified independence   GAIT: Gait pattern: step through pattern, decreased arm swing- Right, decreased  arm swing- Left, decreased stride length, trunk flexed, and narrow BOS Distance walked: 50 ft Assistive device utilized: None Level of assistance: SBA  FUNCTIONAL TESTS:  5 times sit to stand: 21 sec Timed up and go (TUG): 14.84 10 meter walk test: 16.22 sec (2.02 ft/sec) 360 turn to R :  15 steps, 7.16 sec 360 turn to L:  13 steps, 6.63 sec Posterior push and release: multiple small steps, would fall if unaided Forward push and release:  2 small steps to recover TUG cognitive:  16.19 sec (did not count correctly) TUG manual:  16.31sec Backwards 3 M test: 12.91 sec with small steps, decreased foot clearance  Smooth pursuits WFL  TODAY'S TREATMENT:                                                                                                                              DATE: 08/06/2023    PATIENT EDUCATION: Education details: Eval results, POC; discussed importance of consistently taking PD meds and f/u with neurologist (last seen by Butch Penny, NP, 09/2022) Person educated: Patient and Spouse Education method: Explanation Education comprehension: verbalized understanding  HOME EXERCISE PROGRAM: Not yet initiated  GOALS: Goals reviewed with patient? Yes  SHORT TERM GOALS: Target date: 09/04/2023  Pt will be independent with HEP for improved balance, strength, gait. Baseline: Goal status: IN PROGRESS  2.  Pt will improve 5x sit<>stand to less than or equal to 15 sec to demonstrate improved functional strength and transfer efficiency. Baseline: 21 sec Goal status: IN PROGRESS  3.  Pt will improve TUG score to less than or equal to 13.5 sec for decreased fall risk. Baseline: 14.84  sec Goal status: IN PROGRESS  4.  Pt will verbalize understanding of local Parkinson's disease community resources.  Baseline:  Goal status: IN PROGRESS  5.  Pt will verbalize/demo tips to reduce festination/freezing with turns and gait.  Baseline:  Goal status:  IN PROGRESS   LONG TERM GOALS: Target date: 10/02/2023  Pt will be independent with HEP for improved balance, strength, gait. Baseline:  Goal status: IN PROGRESS  2.  Pt will improve gait velocity to at least 2.62 ft/sec for improved gait efficiency and safety. Baseline: 2.02 ft/sec Goal status: IN PROGRESS  3.  Pt will improve 360 turn to 8 steps or less in each direction, to improve turns and decrease fall risk. Baseline: 13-15 steps Goal status: IN PROGRESS  4.  Pt will improve 18M walk backwards to <10 seconds and no LOB. Baseline:  Goal status: IN PROGRESS  5.  Pt will perform posterior push and release in 2 steps or less for improved balance recovery in posterior direction. Baseline:  Goal status: IN PROGRESS  ASSESSMENT:  CLINICAL IMPRESSION: Skilled PT session today focused on aerobic warm up and strengthening as well as balance recovery in posterior direction.  With sit<>stand, pt needs cues for increased forward lean to initiate.  Worked on ankle, hip, step strategy  and generally working to take slower, larger steps with "hinged" hips in posterior direction.  He needs frequent, consistent cues throughout, but he is able to return demo slightly slower pace in posterior direction.  Did not add to HEP today, as pt has had death in the family and will have funeral later this week.  He will return to PT next week and plan to add to HEP and work more in posterior direction at that time. OBJECTIVE IMPAIRMENTS: Abnormal gait, decreased balance, decreased mobility, difficulty walking, decreased strength, and postural dysfunction.   ACTIVITY LIMITATIONS: standing, transfers, and locomotion level  PARTICIPATION  LIMITATIONS: shopping, community activity, and yard work  PERSONAL FACTORS: 3+ comorbidities: see above  are also affecting patient's functional outcome.   REHAB POTENTIAL: Good  CLINICAL DECISION MAKING: Evolving/moderate complexity  EVALUATION COMPLEXITY: Moderate  PLAN:  PT FREQUENCY: 2x/week  PT DURATION: 8 weeks plus eval  PLANNED INTERVENTIONS: Therapeutic exercises, Therapeutic activity, Neuromuscular re-education, Balance training, Gait training, Patient/Family education, Self Care, and Manual therapy  PLAN FOR NEXT SESSION:  Work on balance in posterior direction and add to HEP.  Follow up on education on PD community resources and freezing handout;  marching, turning practice.  Would likely benefit from [facility]! Moves in standing.      Lonia Blood, PT 08/17/23 11:42 AM Phone: [phone] Fax: [phone]   [facility] Outpatient Rehab at [facility] [address] [place], [address] [place], [place] [address] Phone # [phone] Fax # [phone]

## 2023-08-19 ENCOUNTER — Ambulatory Visit: Payer: Medicare PPO | Admitting: Physical Therapy

## 2023-08-24 ENCOUNTER — Ambulatory Visit: Payer: Medicare PPO | Admitting: Physical Therapy

## 2023-08-24 DIAGNOSIS — M6281 Muscle weakness (generalized): Secondary | ICD-10-CM

## 2023-08-24 DIAGNOSIS — R2689 Other abnormalities of gait and mobility: Secondary | ICD-10-CM | POA: Diagnosis not present

## 2023-08-24 DIAGNOSIS — R29818 Other symptoms and signs involving the nervous system: Secondary | ICD-10-CM | POA: Diagnosis not present

## 2023-08-24 DIAGNOSIS — R2681 Unsteadiness on feet: Secondary | ICD-10-CM | POA: Diagnosis not present

## 2023-08-24 NOTE — Therapy (Signed)
OUTPATIENT PHYSICAL THERAPY NEURO TREATMENT   Patient Name: Scott Wang MRN: [id number] DOB:[birth year], 82 y.o., male Today's Date: 08/24/2023   PCP: [name], DO REFERRING PROVIDER: Sheran Luz, MD   END OF SESSION:  PT End of Session - 08/24/23 1109     Visit Number 5    Number of Visits 17    Date for PT Re-Evaluation 10/02/23    Authorization Type [employer]    Authorization Time Period approved 17 PT visits from 08/06/2023 - 10/02/2023    Authorization - Visit Number 5    Authorization - Number of Visits 17    PT Start Time 1105    PT Stop Time 1144    PT Time Calculation (min) 39 min    Equipment Utilized During Treatment Gait belt    Activity Tolerance Patient tolerated treatment well    Behavior During Therapy WFL for tasks assessed/performed                 Past Medical History:  Diagnosis Date   Carpal tunnel syndrome    bilateral, had shots in each one and "haven't had any problems since".   Diabetes mellitus    Hyperlipidemia    Hypertension    Prostate cancer (HCC)    Spinal stenosis    Past Surgical History:  Procedure Laterality Date   HERNIA REPAIR     HERNIA REPAIR     INGUINAL HERNIA REPAIR     KNEE SURGERY  right   KNEE SURGERY Left    PROSTATE BIOPSY     RADIOACTIVE SEED IMPLANT N/A 09/07/2018   Procedure: RADIOACTIVE SEED IMPLANT/BRACHYTHERAPY IMPLANT;  Surgeon: Jerilee Field, MD;  Location: [facility];  Service: Urology;  Laterality: N/A;   SPACE OAR INSTILLATION N/A 09/07/2018   Procedure: SPACE OAR INSTILLATION;  Surgeon: Jerilee Field, MD;  Location: [facility];  Service: Urology;  Laterality: N/A;   Patient Active Problem List   Diagnosis Date Noted   Orthostatic hypotension 11/20/2021   not drinking enough fluids 11/20/2021   Imbalance 11/20/2021   Memory loss 11/20/2021   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   Muscle stiffness due to parkinsons disease  11/20/2021   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;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  No falls.  Did have a lot of times over the weekend, where feet got stuck:  when I was working in the yard and stopped, it's hard to get started again.  Pt accompanied by: self  PERTINENT HISTORY: bilateral leg pain, severe spinal stenosis L4-5 and L3-4, previously evaluated by Dr. Shon Baton  Parkinson's; Right hip pain with range of motion   PAIN:  Are you having pain? No  PRECAUTIONS: Fall  RED FLAGS: None   WEIGHT BEARING RESTRICTIONS: No  FALLS: Has patient fallen in last 6 months? Yes. Number of falls 3 at least Last fall was yesterday LIVING ENVIRONMENT: Lives with: lives with their spouse Lives in: House/apartment Stairs: Yes: External: 3 steps; on right going up Has following equipment at home: Single point cane, Walker - 2 wheeled, and walking pole Has stationary bike at home  PLOF: Independent and Leisure: works in garden, walks around home  PATIENT GOALS: Get over some of the balance issues  OBJECTIVE:       TODAY'S TREATMENT: 08/24/2023 Activity Comments  NuStep, Level 4, 4 extremities x 8 minutes, for aerobic warm up Keeps SPM  65-70; cues to increase to 75-80  Sit<>stand  10 reps from mat surface Cues for upright posture upon standing  Forward>back step and weightshift, 10-15 reps   Heel toe raises 10 reps Strong posterior lean  Back step and weightshift Initial cues for hinge at hips to lessen posterior lean   Pt performs PWR! Moves in standing position x 10 reps   PWR! Up for improved posture  PWR! Rock for improved weighshifting  PWR! Twist for improved trunk rotation   PWR! Step for improved step initiation   Cues provided for  technique, intensity, rationale for PWR! Moves   HOME EXERCISE PROGRAM  Access Code: [email or web address] URL: [email or web address] Date: 08/24/2023 Prepared by: [facility] - Outpatient  Rehab - [facility]  Exercises - Sit to Stand with Arms Crossed  - 1 x daily - 5 x weekly - 2 sets - 10 reps - Alternating Step Backward with Support  - 1 x daily - 7 x weekly - 1-2 sets - 10 reps  ALSO: Sitting PWR moves 10x each per day  Standing PWR! Moves 10 reps each day   PATIENT EDUCATION: Education details: Updated HEP; tips to reduce freezing episodes, including use of PWR! UP posture, then lateral weightshift, then BIG step Person educated: Patient Education method: Explanation, Demonstration, Tactile cues, Verbal cues, and Handouts Education comprehension: verbalized understanding and returned demonstration    Below measures were taken at time of initial evaluation unless otherwise specified:   DIAGNOSTIC FINDINGS: NT  COGNITION: Overall cognitive status: Within functional limits for tasks assessed and difficulty with dual task cognitive activity with gait    POSTURE: rounded shoulders, forward head, posterior pelvic tilt, and flexed trunk   LOWER EXTREMITY ROM:   WFL BLEs  LOWER EXTREMITY MMT:    MMT Right Eval Left Eval  Hip flexion 4 4  Hip extension    Hip abduction    Hip adduction    Hip internal rotation    Hip external rotation    Knee  flexion 4 4  Knee extension 4 4  Ankle dorsiflexion 4 4  Ankle plantarflexion    Ankle inversion    Ankle eversion    (Blank rows = not tested)   TRANSFERS: Assistive device utilized: None  Sit to stand: Modified independence Stand to sit:  Modified independence   GAIT: Gait pattern: step through pattern, decreased arm swing- Right, decreased arm swing- Left, decreased stride length, trunk flexed, and narrow BOS Distance walked: 50 ft Assistive device utilized: None Level of assistance: SBA  FUNCTIONAL TESTS:  5 times sit to stand: 21 sec Timed up and go (TUG): 14.84 10 meter walk test: 16.22 sec (2.02 ft/sec) 360 turn to R :  15 steps, 7.16 sec 360 turn to L:  13 steps, 6.63 sec Posterior push and release: multiple small steps, would fall if unaided Forward push and release:  2 small steps to recover TUG cognitive:  16.19 sec (did not count correctly) TUG manual:  16.31sec Backwards 3 M test: 12.91 sec with small steps, decreased foot clearance  Smooth pursuits WFL  TODAY'S TREATMENT:                                                                                                                              DATE: 08/06/2023    PATIENT EDUCATION: Education details: Eval results, POC; discussed importance of consistently taking PD meds and f/u with neurologist (last seen by Butch Penny, NP, 09/2022) Person educated: Patient and Spouse Education method: Explanation Education comprehension: verbalized understanding  HOME EXERCISE PROGRAM: Not yet initiated  GOALS: Goals reviewed with patient? Yes  SHORT TERM GOALS: Target date: 09/04/2023  Pt will be independent with HEP for improved balance, strength, gait. Baseline: Goal status: IN PROGRESS  2.  Pt will improve 5x sit<>stand to less than or equal to 15 sec to demonstrate improved functional strength and transfer efficiency. Baseline: 21 sec Goal status: IN PROGRESS  3.  Pt will improve TUG score to less  than or equal to 13.5 sec for decreased fall risk. Baseline: 14.84 sec Goal status: IN PROGRESS  4.  Pt will verbalize understanding of local Parkinson's disease community resources.  Baseline:  Goal status: IN PROGRESS  5.  Pt will verbalize/demo tips to reduce festination/freezing with turns and gait.  Baseline:  Goal status:  IN PROGRESS   LONG TERM GOALS: Target date: 10/02/2023  Pt will be independent with HEP for improved balance, strength, gait. Baseline:  Goal status: IN PROGRESS  2.  Pt will improve gait velocity to at least 2.62 ft/sec for improved gait efficiency and safety. Baseline: 2.02 ft/sec Goal status: IN PROGRESS  3.  Pt will improve 360 turn to 8 steps or less in each direction, to improve turns and decrease fall risk. Baseline: 13-15 steps Goal status: IN PROGRESS  4.  Pt will improve 98M walk backwards to <10 seconds and no LOB. Baseline:  Goal status: IN PROGRESS  5.  Pt will perform posterior push and release in 2 steps or less for improved balance recovery in posterior direction. Baseline:  Goal status: IN PROGRESS  ASSESSMENT:  CLINICAL IMPRESSION: Skilled PT session today focused on aerobic warm up as well as continued balance recovery work in posterior direction.  He reports increased  times of freezing/getting stuck while working in the yard over the weekend.  In addition to posterior balance work, we utilized [employer]! Moves in standing to focus on posture and weightshifting to help initiate gait to lessen festination and freezing.  He does well with cues and repetition in session, but he is fatigued and moving slower at end of session (of note, he says it's about an hour before next dose of PD medication).  He will continue to benefit from skilled PT to address balance and overall functional mobility. OBJECTIVE IMPAIRMENTS: Abnormal gait, decreased balance, decreased mobility, difficulty walking, decreased strength, and postural dysfunction.   ACTIVITY  LIMITATIONS: standing, transfers, and locomotion level  PARTICIPATION LIMITATIONS: shopping, community activity, and yard work  PERSONAL FACTORS: 3+ comorbidities: see above  are also affecting patient's functional outcome.   REHAB POTENTIAL: Good  CLINICAL DECISION MAKING: Evolving/moderate complexity  EVALUATION COMPLEXITY: Moderate  PLAN:  PT FREQUENCY: 2x/week  PT DURATION: 8 weeks plus eval  PLANNED INTERVENTIONS: Therapeutic exercises, Therapeutic activity, Neuromuscular re-education, Balance training, Gait training, Patient/Family education, Self Care, and Manual therapy  PLAN FOR NEXT SESSION:  review HEP additions for posterior step and for standing PWR! Moves.  Continue to work on balance in posterior direction.  Follow up on education on PD community resources and freezing handout;  marching, turning practice.     Lonia Blood, PT 08/24/23 11:54 AM Phone: [phone] Fax: [phone]   [facility] at [facility] [address] [place], [address] [place], [place] [address] Phone # [phone] Fax # [phone]

## 2023-08-25 NOTE — Therapy (Signed)
OUTPATIENT PHYSICAL THERAPY NEURO TREATMENT   Patient Name: Scott Wang MRN: [id number] DOB:July 07, 1941, 82 y.o., male Today's Date: 08/26/2023   PCP: [name], DO REFERRING PROVIDER: Sheran Luz, MD   END OF SESSION:  PT End of Session - 08/26/23 1140     Visit Number 6    Number of Visits 17    Date for PT Re-Evaluation 10/02/23    Authorization Type [employer]    Authorization Time Period approved 17 PT visits from 08/06/2023 - 10/02/2023    Authorization - Visit Number 6    Authorization - Number of Visits 17    PT Start Time 1101    PT Stop Time 1142    PT Time Calculation (min) 41 min    Equipment Utilized During Treatment Gait belt    Activity Tolerance Patient tolerated treatment well    Behavior During Therapy WFL for tasks assessed/performed                  Past Medical History:  Diagnosis Date   Carpal tunnel syndrome    bilateral, had shots in each one and "haven't had any problems since".   Diabetes mellitus    Hyperlipidemia    Hypertension    Prostate cancer (HCC)    Spinal stenosis    Past Surgical History:  Procedure Laterality Date   HERNIA REPAIR     HERNIA REPAIR     INGUINAL HERNIA REPAIR     KNEE SURGERY  right   KNEE SURGERY Left    PROSTATE BIOPSY     RADIOACTIVE SEED IMPLANT N/A 09/07/2018   Procedure: RADIOACTIVE SEED IMPLANT/BRACHYTHERAPY IMPLANT;  Surgeon: Jerilee Field, MD;  Location: [facility];  Service: Urology;  Laterality: N/A;   SPACE OAR INSTILLATION N/A 09/07/2018   Procedure: SPACE OAR INSTILLATION;  Surgeon: Jerilee Field, MD;  Location: [facility];  Service: Urology;  Laterality: N/A;   Patient Active Problem List   Diagnosis Date Noted   Orthostatic hypotension 11/20/2021   not drinking enough fluids 11/20/2021   Imbalance 11/20/2021   Memory loss 11/20/2021   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   Muscle stiffness due to parkinsons disease  11/20/2021   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;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   Parkinson's disease (HCC) 12/02/2020   Malignant neoplasm of prostate (HCC) 05/05/2018   Hypertension 06/10/2011   Hyperlipidemia 06/10/2011   Diabetes mellitus (HCC) 06/10/2011   Onychomycosis 06/10/2011   Paronychia 06/10/2011    ONSET DATE: 07/24/2023  REFERRING DIAG: R26.9 (ICD-10-CM) - Unspecified abnormalities of gait and mobility   THERAPY DIAG:  Unsteadiness on feet  Other abnormalities of gait and mobility  Muscle weakness (generalized)  Other symptoms and signs involving the nervous system  Rationale for Evaluation and Treatment: Rehabilitation  SUBJECTIVE:  SUBJECTIVE STATEMENT: Denies falls.   Pt accompanied by: self  PERTINENT HISTORY: bilateral leg pain, severe spinal stenosis L4-5 and L3-4, previously evaluated by Dr. Shon Baton  Parkinson's; Right hip pain with range of motion   PAIN:  Are you having pain? No  PRECAUTIONS: Fall  RED FLAGS: None   WEIGHT BEARING RESTRICTIONS: No  FALLS: Has patient fallen in last 6 months? Yes. Number of falls 3 at least Last fall was yesterday LIVING ENVIRONMENT: Lives with: lives with their spouse Lives in: House/apartment Stairs: Yes: External: 3 steps; on right going up Has following equipment at home: Single point cane, Walker - 2 wheeled, and walking pole Has stationary bike at home  PLOF: Independent and Leisure: works in garden, walks around home  PATIENT GOALS: Get over some of the balance issues  OBJECTIVE:     TODAY'S TREATMENT: 08/26/23 Activity Comments  Nustep L5 x 6 min UEs/LEs  Cueing to maintain 70-75 SPM  Review of HEP: alternating backwards step standing PWR moves Up 2x10 Rock 2x10 Twist 10x Step 10x In II bars; good performance with  backwards steps. Cues to look ahead, large palms, stopping/resetting at midline each time to avoid movements running together, adequate wt shift. Most difficulty with PWR step- performed 1 side at a time and provided cueing for taller step, coordinating UE motion at the same time       PATIENT EDUCATION: Education details: provided handout on PWR moves with [name] corrective notes; pt reports that he cannot recall edu from lat session- review edu on freezing and PD community resources and provided handouts  Person educated: Patient Education method: Explanation, Demonstration, Tactile cues, Verbal cues, and Handouts Education comprehension: verbalized understanding and returned demonstration    HOME EXERCISE PROGRAM  Access Code: [email or web address] URL: [email or web address] Date: 08/24/2023 Prepared by: [facility] - Outpatient  Rehab - [facility]  Exercises - Sit to Stand with Arms Crossed  - 1 x daily - 5 x weekly - 2 sets - 10 reps - Alternating Step Backward with Support  - 1 x daily - 7 x weekly - 1-2 sets - 10 reps  ALSO: Sitting PWR moves 10x each per day  Standing PWR! Moves 10 reps each day       Below measures were taken at time of initial evaluation unless otherwise specified:   DIAGNOSTIC FINDINGS: NT  COGNITION: Overall cognitive status: Within functional limits for tasks assessed and difficulty with dual task cognitive activity with gait    POSTURE: rounded shoulders, forward head, posterior pelvic tilt, and flexed trunk   LOWER EXTREMITY ROM:   WFL BLEs  LOWER EXTREMITY MMT:    MMT Right Eval Left Eval  Hip flexion 4 4  Hip extension    Hip abduction    Hip adduction    Hip internal rotation    Hip external rotation    Knee flexion 4 4  Knee extension 4 4  Ankle dorsiflexion 4 4  Ankle plantarflexion    Ankle inversion    Ankle eversion    (Blank rows = not tested)   TRANSFERS: Assistive device utilized: None  Sit to stand:  Modified independence Stand to sit: Modified independence   GAIT: Gait pattern: step through pattern, decreased arm swing- Right, decreased arm swing- Left, decreased stride length, trunk flexed, and narrow BOS Distance walked: 50 ft Assistive device utilized: None Level of assistance: SBA  FUNCTIONAL TESTS:  5 times sit to stand: 21 sec Timed up and go (TUG): 14.84 10  meter walk test: 16.22 sec (2.02 ft/sec) 360 turn to R :  15 steps, 7.16 sec 360 turn to L:  13 steps, 6.63 sec Posterior push and release: multiple small steps, would fall if unaided Forward push and release:  2 small steps to recover TUG cognitive:  16.19 sec (did not count correctly) TUG manual:  16.31sec Backwards 3 M test: 12.91 sec with small steps, decreased foot clearance  Smooth pursuits WFL  TODAY'S TREATMENT:                                                                                                                              DATE: 08/06/2023    PATIENT EDUCATION: Education details: Eval results, POC; discussed importance of consistently taking PD meds and f/u with neurologist (last seen by Butch Penny, NP, 09/2022) Person educated: Patient and Spouse Education method: Explanation Education comprehension: verbalized understanding  HOME EXERCISE PROGRAM: Not yet initiated  GOALS: Goals reviewed with patient? Yes  SHORT TERM GOALS: Target date: 09/04/2023  Pt will be independent with HEP for improved balance, strength, gait. Baseline: Goal status: IN PROGRESS  2.  Pt will improve 5x sit<>stand to less than or equal to 15 sec to demonstrate improved functional strength and transfer efficiency. Baseline: 21 sec Goal status: IN PROGRESS  3.  Pt will improve TUG score to less than or equal to 13.5 sec for decreased fall risk. Baseline: 14.84 sec Goal status: IN PROGRESS  4.  Pt will verbalize understanding of local Parkinson's disease community resources.  Baseline:  Goal status: IN  PROGRESS  5.  Pt will verbalize/demo tips to reduce festination/freezing with turns and gait.  Baseline:  Goal status:  IN PROGRESS   LONG TERM GOALS: Target date: 10/02/2023  Pt will be independent with HEP for improved balance, strength, gait. Baseline:  Goal status: IN PROGRESS  2.  Pt will improve gait velocity to at least 2.62 ft/sec for improved gait efficiency and safety. Baseline: 2.02 ft/sec Goal status: IN PROGRESS  3.  Pt will improve 360 turn to 8 steps or less in each direction, to improve turns and decrease fall risk. Baseline: 13-15 steps Goal status: IN PROGRESS  4.  Pt will improve 40M walk backwards to <10 seconds and no LOB. Baseline:  Goal status: IN PROGRESS  5.  Pt will perform posterior push and release in 2 steps or less for improved balance recovery in posterior direction. Baseline:  Goal status: IN PROGRESS  ASSESSMENT:  CLINICAL IMPRESSION: Patient arrived to session with complaints. Reviewed HEP for proper positioning and safety. Patient required several corrective cues with standing PWR moves for improved coordination, amplitude of movement, and adequate wt shift. Wrote down provided cues for improved performance at home. Patient also admits to no recollection of edu provided last session, thus provided handouts and edu on freezing and PD community resources. Patient reported understanding of edu provided.  OBJECTIVE IMPAIRMENTS: Abnormal gait, decreased balance, decreased mobility, difficulty  walking, decreased strength, and postural dysfunction.   ACTIVITY LIMITATIONS: standing, transfers, and locomotion level  PARTICIPATION LIMITATIONS: shopping, community activity, and yard work  PERSONAL FACTORS: 3+ comorbidities: see above  are also affecting patient's functional outcome.   REHAB POTENTIAL: Good  CLINICAL DECISION MAKING: Evolving/moderate complexity  EVALUATION COMPLEXITY: Moderate  PLAN:  PT FREQUENCY: 2x/week  PT DURATION: 8  weeks plus eval  PLANNED INTERVENTIONS: Therapeutic exercises, Therapeutic activity, Neuromuscular re-education, Balance training, Gait training, Patient/Family education, Self Care, and Manual therapy  PLAN FOR NEXT SESSION:  Continue to work on balance in posterior direction.  Follow up on education on PD community resources and freezing handout;  marching, turning practice.    Anette Guarneri, PT, DPT 08/26/23 11:45 AM  [facility] at [facility] [address] [place], [address] [place], [place] [address] Phone # [phone] Fax # [phone]

## 2023-08-26 ENCOUNTER — Ambulatory Visit: Payer: Medicare PPO | Admitting: Physical Therapy

## 2023-08-26 ENCOUNTER — Encounter: Payer: Self-pay | Admitting: Physical Therapy

## 2023-08-26 DIAGNOSIS — R29818 Other symptoms and signs involving the nervous system: Secondary | ICD-10-CM | POA: Diagnosis not present

## 2023-08-26 DIAGNOSIS — R2681 Unsteadiness on feet: Secondary | ICD-10-CM

## 2023-08-26 DIAGNOSIS — R2689 Other abnormalities of gait and mobility: Secondary | ICD-10-CM | POA: Diagnosis not present

## 2023-08-26 DIAGNOSIS — M6281 Muscle weakness (generalized): Secondary | ICD-10-CM | POA: Diagnosis not present

## 2023-08-26 NOTE — Patient Instructions (Signed)
Tips to reduce freezing episodes with standing or walking:   Stand tall with your feet wide, so that you can rock and weight shift through your hips. Don't try to fight the freeze: if you begin taking slower, faster, smaller steps, STOP, get your posture tall, and RESET your posture and balance.  Take a deep breath before taking the BIG step to start again. March in place, with high knee stepping, to get started walking again. Use auditory cues:  Count out loud, think of a familiar tune or song or cadence, use pocket metronome, to use rhythm to get started walking again. Use visual cues:  Use a line to step over, use laser pointer line to step over, (using BIG steps) to start walking again. Use visual targets to keep your posture tall (look ahead and focus on an object or target at eye level). As you approach where your destination with walking, count your steps out loud and/or focus on your target with your eyes until you are fully there. Use appropriate assistive device, as advised by your physical therapist to assist with taking longer, consistent steps.

## 2023-09-01 ENCOUNTER — Ambulatory Visit: Payer: Medicare PPO

## 2023-09-01 DIAGNOSIS — R29818 Other symptoms and signs involving the nervous system: Secondary | ICD-10-CM | POA: Diagnosis not present

## 2023-09-01 DIAGNOSIS — M6281 Muscle weakness (generalized): Secondary | ICD-10-CM | POA: Diagnosis not present

## 2023-09-01 DIAGNOSIS — R2689 Other abnormalities of gait and mobility: Secondary | ICD-10-CM

## 2023-09-01 DIAGNOSIS — R2681 Unsteadiness on feet: Secondary | ICD-10-CM | POA: Diagnosis not present

## 2023-09-01 NOTE — Therapy (Signed)
OUTPATIENT PHYSICAL THERAPY NEURO TREATMENT   Patient Name: Scott Wang MRN: [id number] DOB:11/08/1941, 82 y.o., male Today's Date: 09/01/2023   PCP: [name], DO REFERRING PROVIDER: Sheran Luz, MD   END OF SESSION:  PT End of Session - 09/01/23 1145     Visit Number 7    Number of Visits 17    Date for PT Re-Evaluation 10/02/23    Authorization Type [employer]    Authorization Time Period approved 17 PT visits from 08/06/2023 - 10/02/2023    Authorization - Visit Number 7    Authorization - Number of Visits 17    PT Start Time 1145    PT Stop Time 1230    PT Time Calculation (min) 45 min    Equipment Utilized During Treatment Gait belt    Activity Tolerance Patient tolerated treatment well    Behavior During Therapy WFL for tasks assessed/performed                  Past Medical History:  Diagnosis Date   Carpal tunnel syndrome    bilateral, had shots in each one and "haven't had any problems since".   Diabetes mellitus    Hyperlipidemia    Hypertension    Prostate cancer (HCC)    Spinal stenosis    Past Surgical History:  Procedure Laterality Date   HERNIA REPAIR     HERNIA REPAIR     INGUINAL HERNIA REPAIR     KNEE SURGERY  right   KNEE SURGERY Left    PROSTATE BIOPSY     RADIOACTIVE SEED IMPLANT N/A 09/07/2018   Procedure: RADIOACTIVE SEED IMPLANT/BRACHYTHERAPY IMPLANT;  Surgeon: Jerilee Field, MD;  Location: [facility];  Service: Urology;  Laterality: N/A;   SPACE OAR INSTILLATION N/A 09/07/2018   Procedure: SPACE OAR INSTILLATION;  Surgeon: Jerilee Field, MD;  Location: [facility];  Service: Urology;  Laterality: N/A;   Patient Active Problem List   Diagnosis Date Noted   Orthostatic hypotension 11/20/2021   not drinking enough fluids 11/20/2021   Imbalance 11/20/2021   Memory loss 11/20/2021   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   Muscle stiffness due to parkinsons disease  11/20/2021   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;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   Parkinson's disease (HCC) 12/02/2020   Malignant neoplasm of prostate (HCC) 05/05/2018   Hypertension 06/10/2011   Hyperlipidemia 06/10/2011   Diabetes mellitus (HCC) 06/10/2011   Onychomycosis 06/10/2011   Paronychia 06/10/2011    ONSET DATE: 07/24/2023  REFERRING DIAG: R26.9 (ICD-10-CM) - Unspecified abnormalities of gait and mobility   THERAPY DIAG:  Unsteadiness on feet  Other abnormalities of gait and mobility  Muscle weakness (generalized)  Other symptoms and signs involving the nervous system  Rationale for Evaluation and Treatment: Rehabilitation  SUBJECTIVE:  SUBJECTIVE STATEMENT: Doing ok, the back isn't too bad. Backing up and turning are the most difficult  Pt accompanied by: self  PERTINENT HISTORY: bilateral leg pain, severe spinal stenosis L4-5 and L3-4, previously evaluated by Dr. Shon Baton  Parkinson's; Right hip pain with range of motion   PAIN:  Are you having pain? No  PRECAUTIONS: Fall  RED FLAGS: None   WEIGHT BEARING RESTRICTIONS: No  FALLS: Has patient fallen in last 6 months? Yes. Number of falls 3 at least Last fall was yesterday LIVING ENVIRONMENT: Lives with: lives with their spouse Lives in: House/apartment Stairs: Yes: External: 3 steps; on right going up Has following equipment at home: Single point cane, Walker - 2 wheeled, and walking pole Has stationary bike at home  PLOF: Independent and Leisure: works in garden, walks around home  PATIENT GOALS: Get over some of the balance issues  OBJECTIVE:   TODAY'S TREATMENT: 09/01/23 Activity Comments  Seated/standing PWR moves 1x10, slow guided rehearsal with tapered feedback  Repeated turns, transferring cones from surface 1x10 pivot  turns 1x10 360 deg turns, average of 4-steps to reduce freezing of gait  Quarter-turns 1x5 At counter for UE support to guide sequence for turns  1/2 turns 1x5 In doorway, for tactile cues to initiate/guide repeated turns  Gastroc stretch 1x60 sec Heavy cues for position/sequence         TODAY'S TREATMENT: 08/26/23 Activity Comments  Nustep L5 x 6 min UEs/LEs  Cueing to maintain 70-75 SPM  Review of HEP: alternating backwards step standing PWR moves Up 2x10 Rock 2x10 Twist 10x Step 10x In II bars; good performance with backwards steps. Cues to look ahead, large palms, stopping/resetting at midline each time to avoid movements running together, adequate wt shift. Most difficulty with PWR step- performed 1 side at a time and provided cueing for taller step, coordinating UE motion at the same time       PATIENT EDUCATION: Education details: provided handout on PWR moves with [name] corrective notes; pt reports that he cannot recall edu from lat session- review edu on freezing and PD community resources and provided handouts  Person educated: Patient Education method: Explanation, Demonstration, Tactile cues, Verbal cues, and Handouts Education comprehension: verbalized understanding and returned demonstration    HOME EXERCISE PROGRAM  Access Code: [email or web address] URL: [email or web address] Date: 08/24/2023 Prepared by: [facility] - Outpatient  Rehab - [facility]  Exercises - Sit to Stand with Arms Crossed  - 1 x daily - 5 x weekly - 2 sets - 10 reps - Alternating Step Backward with Support  - 1 x daily - 7 x weekly - 1-2 sets - 10 reps - Standing Quarter Turn with Counter Support  - 1 x daily - 7 x weekly - 1-3 sets - 5 reps - 180 Degree Pivot Turn with Single Point Cane  - 1 x daily - 7 x weekly - 1-3 sets - 5 reps - Standing Gastroc Stretch  - 1 x daily - 7 x weekly - 1-3 sets - 60 sec hold  ALSO: Sitting PWR moves 10x each per day  Standing PWR! Moves 10 reps  each day       Below measures were taken at time of initial evaluation unless otherwise specified:   DIAGNOSTIC FINDINGS: NT  COGNITION: Overall cognitive status: Within functional limits for tasks assessed and difficulty with dual task cognitive activity with gait    POSTURE: rounded shoulders, forward head, posterior pelvic tilt, and flexed trunk   LOWER EXTREMITY  ROM:   WFL BLEs  LOWER EXTREMITY MMT:    MMT Right Eval Left Eval  Hip flexion 4 4  Hip extension    Hip abduction    Hip adduction    Hip internal rotation    Hip external rotation    Knee flexion 4 4  Knee extension 4 4  Ankle dorsiflexion 4 4  Ankle plantarflexion    Ankle inversion    Ankle eversion    (Blank rows = not tested)   TRANSFERS: Assistive device utilized: None  Sit to stand: Modified independence Stand to sit: Modified independence   GAIT: Gait pattern: step through pattern, decreased arm swing- Right, decreased arm swing- Left, decreased stride length, trunk flexed, and narrow BOS Distance walked: 50 ft Assistive device utilized: None Level of assistance: SBA  FUNCTIONAL TESTS:  5 times sit to stand: 21 sec Timed up and go (TUG): 14.84 10 meter walk test: 16.22 sec (2.02 ft/sec) 360 turn to R :  15 steps, 7.16 sec 360 turn to L:  13 steps, 6.63 sec Posterior push and release: multiple small steps, would fall if unaided Forward push and release:  2 small steps to recover TUG cognitive:  16.19 sec (did not count correctly) TUG manual:  16.31sec Backwards 3 M test: 12.91 sec with small steps, decreased foot clearance  Smooth pursuits WFL  TODAY'S TREATMENT:                                                                                                                              DATE: 08/06/2023    PATIENT EDUCATION: Education details: Eval results, POC; discussed importance of consistently taking PD meds and f/u with neurologist (last seen by Butch Penny, NP,  09/2022) Person educated: Patient and Spouse Education method: Explanation Education comprehension: verbalized understanding  HOME EXERCISE PROGRAM: Not yet initiated  GOALS: Goals reviewed with patient? Yes  SHORT TERM GOALS: Target date: 09/04/2023  Pt will be independent with HEP for improved balance, strength, gait. Baseline: Goal status: IN PROGRESS  2.  Pt will improve 5x sit<>stand to less than or equal to 15 sec to demonstrate improved functional strength and transfer efficiency. Baseline: 21 sec Goal status: IN PROGRESS  3.  Pt will improve TUG score to less than or equal to 13.5 sec for decreased fall risk. Baseline: 14.84 sec Goal status: IN PROGRESS  4.  Pt will verbalize understanding of local Parkinson's disease community resources.  Baseline:  Goal status: IN PROGRESS  5.  Pt will verbalize/demo tips to reduce festination/freezing with turns and gait.  Baseline:  Goal status:  IN PROGRESS   LONG TERM GOALS: Target date: 10/02/2023  Pt will be independent with HEP for improved balance, strength, gait. Baseline:  Goal status: IN PROGRESS  2.  Pt will improve gait velocity to at least 2.62 ft/sec for improved gait efficiency and safety. Baseline: 2.02 ft/sec Goal status: IN PROGRESS  3.  Pt will improve  360 turn to 8 steps or less in each direction, to improve turns and decrease fall risk. Baseline: 13-15 steps Goal status: IN PROGRESS  4.  Pt will improve 38M walk backwards to <10 seconds and no LOB. Baseline:  Goal status: IN PROGRESS  5.  Pt will perform posterior push and release in 2 steps or less for improved balance recovery in posterior direction. Baseline:  Goal status: IN PROGRESS  ASSESSMENT:  CLINICAL IMPRESSION: Patient arrived to session with no issues, notes he was able to improve backwards walking while vacuuming at home by taking larger steps than what is "natural" and this improve stability.  Review of seated and standing large  amplitude movements per HEP and requires heavy cues for position and sequence with ability to taper feedback with subsequent reps.  Addressed techniques to improve turning to reduce freezing of gait w/ activities emphasizing pivot and 180 degree turns.  Improved performance with clock face imagery for foot placement as well as verbal cues in step height/clearance.  Addition to HEP for activities to address deficits with turning with review during session. Continued sesisons to advance POC details and improve mobility/coordination OBJECTIVE IMPAIRMENTS: Abnormal gait, decreased balance, decreased mobility, difficulty walking, decreased strength, and postural dysfunction.   ACTIVITY LIMITATIONS: standing, transfers, and locomotion level  PARTICIPATION LIMITATIONS: shopping, community activity, and yard work  PERSONAL FACTORS: 3+ comorbidities: see above  are also affecting patient's functional outcome.   REHAB POTENTIAL: Good  CLINICAL DECISION MAKING: Evolving/moderate complexity  EVALUATION COMPLEXITY: Moderate  PLAN:  PT FREQUENCY: 2x/week  PT DURATION: 8 weeks plus eval  PLANNED INTERVENTIONS: Therapeutic exercises, Therapeutic activity, Neuromuscular re-education, Balance training, Gait training, Patient/Family education, Self Care, and Manual therapy  PLAN FOR NEXT SESSION:  Check STG. Continue to work on balance in posterior direction.  Follow up on education on PD community resources and freezing handout;  marching, turning practice.    12:38 PM, 09/01/23 M. Shary Decamp, PT, DPT Physical Therapist- [place] Office Number: [phone]

## 2023-09-03 DIAGNOSIS — Z125 Encounter for screening for malignant neoplasm of prostate: Secondary | ICD-10-CM | POA: Diagnosis not present

## 2023-09-03 DIAGNOSIS — E1169 Type 2 diabetes mellitus with other specified complication: Secondary | ICD-10-CM | POA: Diagnosis not present

## 2023-09-03 DIAGNOSIS — I10 Essential (primary) hypertension: Secondary | ICD-10-CM | POA: Diagnosis not present

## 2023-09-03 DIAGNOSIS — E785 Hyperlipidemia, unspecified: Secondary | ICD-10-CM | POA: Diagnosis not present

## 2023-09-03 DIAGNOSIS — Z1389 Encounter for screening for other disorder: Secondary | ICD-10-CM | POA: Diagnosis not present

## 2023-09-03 DIAGNOSIS — Z0001 Encounter for general adult medical examination with abnormal findings: Secondary | ICD-10-CM | POA: Diagnosis not present

## 2023-09-04 ENCOUNTER — Ambulatory Visit: Payer: Medicare PPO | Admitting: Physical Therapy

## 2023-09-04 ENCOUNTER — Encounter: Payer: Self-pay | Admitting: Physical Therapy

## 2023-09-04 DIAGNOSIS — R2689 Other abnormalities of gait and mobility: Secondary | ICD-10-CM | POA: Diagnosis not present

## 2023-09-04 DIAGNOSIS — M6281 Muscle weakness (generalized): Secondary | ICD-10-CM

## 2023-09-04 DIAGNOSIS — R29818 Other symptoms and signs involving the nervous system: Secondary | ICD-10-CM | POA: Diagnosis not present

## 2023-09-04 DIAGNOSIS — R2681 Unsteadiness on feet: Secondary | ICD-10-CM | POA: Diagnosis not present

## 2023-09-04 NOTE — Therapy (Signed)
OUTPATIENT PHYSICAL THERAPY NEURO TREATMENT/DISCHARGE SUMMARY   Patient Name: Scott Wang MRN: [id number] DOB:October 20, 1941, 82 y.o., male Today's Date: 09/04/2023   PCP: [name], DO REFERRING PROVIDER: Sheran Luz, MD    PHYSICAL THERAPY DISCHARGE SUMMARY  Visits from Start of Care: 8  Current functional level related to goals / functional outcomes: See below for goals   Remaining deficits: Shuffling, festination, decreased timing and coordination of gait (pt reports later in the day, it is worse)   Education / Equipment: Educated in [place], PD community resources, tips to reduce freezing with gait.   Patient agrees to discharge. Patient goals were met. Patient is being discharged due to being pleased with the current functional level.  Pt would benefit from return PT eval in 6 months due to progressive nature of disease process.    Lonia Blood, PT 09/04/23 12:18 PM Phone: [phone] Fax: [phone]   END OF SESSION:  PT End of Session - 09/04/23 1104     Visit Number 8    Number of Visits 17    Date for PT Re-Evaluation 10/02/23    Authorization Type [employer]    Authorization Time Period approved 17 PT visits from 08/06/2023 - 10/02/2023    Authorization - Visit Number 8    Authorization - Number of Visits 17    PT Start Time 1104    PT Stop Time 1145    PT Time Calculation (min) 41 min    Equipment Utilized During Treatment Gait belt    Activity Tolerance Patient tolerated treatment well    Behavior During Therapy WFL for tasks assessed/performed                   Past Medical History:  Diagnosis Date   Carpal tunnel syndrome    bilateral, had shots in each one and "haven't had any problems since".   Diabetes mellitus    Hyperlipidemia    Hypertension    Prostate cancer (HCC)    Spinal stenosis    Past Surgical History:  Procedure Laterality Date   HERNIA REPAIR     HERNIA REPAIR     INGUINAL HERNIA REPAIR     KNEE  SURGERY  right   KNEE SURGERY Left    PROSTATE BIOPSY     RADIOACTIVE SEED IMPLANT N/A 09/07/2018   Procedure: RADIOACTIVE SEED IMPLANT/BRACHYTHERAPY IMPLANT;  Surgeon: Jerilee Field, MD;  Location: [facility];  Service: Urology;  Laterality: N/A;   SPACE OAR INSTILLATION N/A 09/07/2018   Procedure: SPACE OAR INSTILLATION;  Surgeon: Jerilee Field, MD;  Location: [facility];  Service: Urology;  Laterality: N/A;   Patient Active Problem List   Diagnosis Date Noted   Orthostatic hypotension 11/20/2021   not drinking enough fluids 11/20/2021   Imbalance 11/20/2021   Memory loss 11/20/2021   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   Muscle stiffness due to parkinson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;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   Parkinson's disease (HCC) 12/02/2020   Malignant neoplasm of prostate (HCC) 05/05/2018   Hypertension 06/10/2011   Hyperlipidemia 06/10/2011   Diabetes mellitus (HCC) 06/10/2011   Onychomycosis 06/10/2011   Paronychia 06/10/2011    ONSET DATE: 07/24/2023  REFERRING DIAG: R26.9 (ICD-10-CM) - Unspecified abnormalities of gait and mobility   THERAPY DIAG:  Unsteadiness on feet  Other abnormalities of gait and mobility  Muscle weakness (generalized)  Other symptoms and signs involving the nervous system  Rationale for Evaluation and Treatment: Rehabilitation  SUBJECTIVE:                                                                                                                                                                                             SUBJECTIVE STATEMENT: No falls, Backing up and turning are the most difficult, but "not too bad."  I've learned to take big steps not small steps and I pay more attention.  Maybe want to take a break from therapy-have a lot going on right now.  Pt accompanied by: self  PERTINENT HISTORY: bilateral leg pain, severe spinal stenosis L4-5 and L3-4,  previously evaluated by Dr. Shon Baton  Parkinson's; Right hip pain with range of motion   PAIN:  Are you having pain? No  PRECAUTIONS: Fall  RED FLAGS: None   WEIGHT BEARING RESTRICTIONS: No  FALLS: Has patient fallen in last 6 months? Yes. Number of falls 3 at least Last fall was yesterday LIVING ENVIRONMENT: Lives with: lives with their spouse Lives in: House/apartment Stairs: Yes: External: 3 steps; on right going up Has following equipment at home: Single point cane, Walker - 2 wheeled, and walking pole Has stationary bike at home  PLOF: Independent and Leisure: works in garden, walks around home  PATIENT GOALS: Get over some of the balance issues  OBJECTIVE:    TODAY'S TREATMENT: 09/04/2023 Activity Comments  NuStep, Level 5, 4 extremities x 8 minutes Cues to keep SPM >80  FTSTS:  14.04 sec Compared to 21 sec  TUG:  11.68 sec Compared to 14.84 sec  Gait velocity:  10.75 sec (3.05 ft/sec) Compared to 2.02 ft/sec  3 M back:  7 sec Compared to 12.91 sec  Posterior push and release 1-2 steps, 3 trials  360 turns R 8 steps L:  9 steps Compared to 13-15 steps  HEP review Pt requires cues for technique and reminder cues for form for gastroc stretch     PATIENT EDUCATION: Education details: Progress towards goals, POC; importance of consistent performance of HEP, plans for d/c and return PT eval in 6 months Person educated: Patient, wife Education method: Explanation, Demonstration, Tactile cues, Verbal cues, and Handouts Education comprehension: verbalized understanding and returned demonstration    HOME EXERCISE PROGRAM  Access Code: [email or web address] URL: [email or web address] Date: 08/24/2023 Prepared by: [facility] - Outpatient  Rehab - [facility]  Exercises - Sit to Stand with Arms Crossed  - 1 x daily - 5 x weekly - 2 sets - 10 reps - Alternating Step Backward with Support  - 1 x daily - 7 x weekly - 1-2 sets - 10 reps - Standing Quarter Turn with  Counter  Support  - 1 x daily - 7 x weekly - 1-3 sets - 5 reps - 180 Degree Pivot Turn with Single Point Cane  - 1 x daily - 7 x weekly - 1-3 sets - 5 reps - Standing Gastroc Stretch  - 1 x daily - 7 x weekly - 1-3 sets - 60 sec hold  ALSO: Sitting PWR moves 10x each per day  Standing PWR! Moves 10 reps each day       Below measures were taken at time of initial evaluation unless otherwise specified:   DIAGNOSTIC FINDINGS: NT  COGNITION: Overall cognitive status: Within functional limits for tasks assessed and difficulty with dual task cognitive activity with gait    POSTURE: rounded shoulders, forward head, posterior pelvic tilt, and flexed trunk   LOWER EXTREMITY ROM:   WFL BLEs  LOWER EXTREMITY MMT:    MMT Right Eval Left Eval  Hip flexion 4 4  Hip extension    Hip abduction    Hip adduction    Hip internal rotation    Hip external rotation    Knee flexion 4 4  Knee extension 4 4  Ankle dorsiflexion 4 4  Ankle plantarflexion    Ankle inversion    Ankle eversion    (Blank rows = not tested)   TRANSFERS: Assistive device utilized: None  Sit to stand: Modified independence Stand to sit: Modified independence   GAIT: Gait pattern: step through pattern, decreased arm swing- Right, decreased arm swing- Left, decreased stride length, trunk flexed, and narrow BOS Distance walked: 50 ft Assistive device utilized: None Level of assistance: SBA  FUNCTIONAL TESTS:  5 times sit to stand: 21 sec Timed up and go (TUG): 14.84 10 meter walk test: 16.22 sec (2.02 ft/sec) 360 turn to R :  15 steps, 7.16 sec 360 turn to L:  13 steps, 6.63 sec Posterior push and release: multiple small steps, would fall if unaided Forward push and release:  2 small steps to recover TUG cognitive:  16.19 sec (did not count correctly) TUG manual:  16.31sec Backwards 3 M test: 12.91 sec with small steps, decreased foot clearance  Smooth pursuits WFL  TODAY'S TREATMENT:                                                                                                                               DATE: 08/06/2023    PATIENT EDUCATION: Education details: Eval results, POC; discussed importance of consistently taking PD meds and f/u with neurologist (last seen by Butch Penny, NP, 09/2022) Person educated: Patient and Spouse Education method: Explanation Education comprehension: verbalized understanding  HOME EXERCISE PROGRAM: Not yet initiated  GOALS: Goals reviewed with patient? Yes  SHORT TERM GOALS: Target date: 09/04/2023  Pt will be independent with HEP for improved balance, strength, gait. Baseline: Goal status: PARTIALLY MET, 09/04/2023  2.  Pt will improve 5x sit<>stand to less than or equal to 15 sec to demonstrate improved  functional strength and transfer efficiency. Baseline: 21 sec>14.04 sec 09/04/2023 Goal status: MET, 09/04/2023  3.  Pt will improve TUG score to less than or equal to 13.5 sec for decreased fall risk. Baseline: 14.84 sec>11 sec Goal status: MET, 09/04/2023  4.  Pt will verbalize understanding of local Parkinson's disease community resources.  Baseline:  Goal status: PARTIALLY MET, 09/04/2023  5.  Pt will verbalize/demo tips to reduce festination/freezing with turns and gait.  Baseline:  Goal status:  MET 09/04/2023   LONG TERM GOALS: Target date: 10/02/2023  Pt will be independent with HEP for improved balance, strength, gait. Baseline:  Goal status: PARTIALLY MET 09/04/2023  2.  Pt will improve gait velocity to at least 2.62 ft/sec for improved gait efficiency and safety. Baseline: 2.02 ft/sec>3.05 ft/sec 09/04/2023 Goal status: MET 09/04/2023  3.  Pt will improve 360 turn to 8 steps or less in each direction, to improve turns and decrease fall risk. Baseline: 13-15 steps Goal status: MET 09/04/2023  4.  Pt will improve 57M walk backwards to <10 seconds and no LOB. Baseline: 7 sec 09/04/2023 Goal status: MET  09/04/2023  5.  Pt will perform posterior push and release in 2 steps or less for improved balance recovery in posterior direction. Baseline:  Goal status: MET 09/04/2023  ASSESSMENT:  CLINICAL IMPRESSION: Pt presents today and STGs/LTGs assessed.  Pt does report he would like to take a break from therapy, due to wife having upcoming cataract surgeries and multiple medical appointments.  He does note that he has improved with backwards balance.  Pt has met 3 of 5 STGs and 4 of 5 LTGS, with HEP goal being partially met.  Pt appears to be performing HEP, but needs cues for optimal form and amplitude.  Reiterated importance of continued HEP performance at home, and he reports at beginning of the year, he plans to start PD ex classes at [facility].  He has made very nice progress with improvement in all objective measures.  Given pt's nice progress with short bout of therapy and his request to discharge today, will plan for d/c, but PT feels that pt would benefit from return PT eval in 6 months due to progressive nature of disease process.  OBJECTIVE IMPAIRMENTS: Abnormal gait, decreased balance, decreased mobility, difficulty walking, decreased strength, and postural dysfunction.   ACTIVITY LIMITATIONS: standing, transfers, and locomotion level  PARTICIPATION LIMITATIONS: shopping, community activity, and yard work  PERSONAL FACTORS: 3+ comorbidities: see above  are also affecting patient's functional outcome.   REHAB POTENTIAL: Good  CLINICAL DECISION MAKING: Evolving/moderate complexity  EVALUATION COMPLEXITY: Moderate  PLAN:  PT FREQUENCY: 2x/week  PT DURATION: 8 weeks plus eval  PLANNED INTERVENTIONS: Therapeutic exercises, Therapeutic activity, Neuromuscular re-education, Balance training, Gait training, Patient/Family education, Self Care, and Manual therapy  PLAN FOR NEXT SESSION:  Discharge PT    Lonia Blood, PT 09/04/23 12:16 PM Phone: [phone] Fax: [phone]    [facility] [address], [address] [place], [place] [address] Phone # [phone] Fax # [phone]

## 2023-09-09 DIAGNOSIS — R82998 Other abnormal findings in urine: Secondary | ICD-10-CM | POA: Diagnosis not present

## 2023-09-09 DIAGNOSIS — R310 Gross hematuria: Secondary | ICD-10-CM | POA: Diagnosis not present

## 2023-09-09 DIAGNOSIS — E1169 Type 2 diabetes mellitus with other specified complication: Secondary | ICD-10-CM | POA: Diagnosis not present

## 2023-09-09 DIAGNOSIS — Z8546 Personal history of malignant neoplasm of prostate: Secondary | ICD-10-CM | POA: Diagnosis not present

## 2023-09-09 DIAGNOSIS — I10 Essential (primary) hypertension: Secondary | ICD-10-CM | POA: Diagnosis not present

## 2023-09-09 DIAGNOSIS — D4101 Neoplasm of uncertain behavior of right kidney: Secondary | ICD-10-CM | POA: Diagnosis not present

## 2023-09-10 ENCOUNTER — Inpatient Hospital Stay (HOSPITAL_COMMUNITY)
Admission: EM | Admit: 2023-09-10 | Discharge: 2023-09-17 | DRG: 871 | Disposition: A | Discharge status: Skilled Nursing Facility | Payer: Medicare PPO | Attending: Family Medicine | Admitting: Family Medicine

## 2023-09-10 ENCOUNTER — Other Ambulatory Visit: Payer: Self-pay

## 2023-09-10 ENCOUNTER — Emergency Department (HOSPITAL_COMMUNITY): Payer: Medicare PPO

## 2023-09-10 DIAGNOSIS — R6520 Severe sepsis without septic shock: Secondary | ICD-10-CM | POA: Diagnosis present

## 2023-09-10 DIAGNOSIS — R1310 Dysphagia, unspecified: Secondary | ICD-10-CM | POA: Diagnosis present

## 2023-09-10 DIAGNOSIS — Z79899 Other long term (current) drug therapy: Secondary | ICD-10-CM

## 2023-09-10 DIAGNOSIS — E860 Dehydration: Secondary | ICD-10-CM | POA: Diagnosis present

## 2023-09-10 DIAGNOSIS — G20A1 Parkinson's disease without dyskinesia, without mention of fluctuations: Secondary | ICD-10-CM | POA: Diagnosis not present

## 2023-09-10 DIAGNOSIS — E872 Acidosis, unspecified: Secondary | ICD-10-CM | POA: Diagnosis present

## 2023-09-10 DIAGNOSIS — K219 Gastro-esophageal reflux disease without esophagitis: Secondary | ICD-10-CM | POA: Diagnosis present

## 2023-09-10 DIAGNOSIS — E222 Syndrome of inappropriate secretion of antidiuretic hormone: Secondary | ICD-10-CM | POA: Diagnosis present

## 2023-09-10 DIAGNOSIS — R000 Tachycardia, unspecified: Secondary | ICD-10-CM | POA: Diagnosis not present

## 2023-09-10 DIAGNOSIS — G8929 Other chronic pain: Secondary | ICD-10-CM | POA: Diagnosis present

## 2023-09-10 DIAGNOSIS — F0280 Dementia in other diseases classified elsewhere without behavioral disturbance: Secondary | ICD-10-CM | POA: Diagnosis not present

## 2023-09-10 DIAGNOSIS — N390 Urinary tract infection, site not specified: Secondary | ICD-10-CM | POA: Diagnosis present

## 2023-09-10 DIAGNOSIS — I129 Hypertensive chronic kidney disease with stage 1 through stage 4 chronic kidney disease, or unspecified chronic kidney disease: Secondary | ICD-10-CM | POA: Diagnosis present

## 2023-09-10 DIAGNOSIS — R278 Other lack of coordination: Secondary | ICD-10-CM | POA: Diagnosis not present

## 2023-09-10 DIAGNOSIS — Z9181 History of falling: Secondary | ICD-10-CM | POA: Diagnosis not present

## 2023-09-10 DIAGNOSIS — N170 Acute kidney failure with tubular necrosis: Secondary | ICD-10-CM | POA: Diagnosis not present

## 2023-09-10 DIAGNOSIS — F0390 Unspecified dementia without behavioral disturbance: Secondary | ICD-10-CM | POA: Diagnosis not present

## 2023-09-10 DIAGNOSIS — J9811 Atelectasis: Secondary | ICD-10-CM | POA: Diagnosis present

## 2023-09-10 DIAGNOSIS — I959 Hypotension, unspecified: Secondary | ICD-10-CM | POA: Diagnosis not present

## 2023-09-10 DIAGNOSIS — E785 Hyperlipidemia, unspecified: Secondary | ICD-10-CM | POA: Diagnosis present

## 2023-09-10 DIAGNOSIS — Z87891 Personal history of nicotine dependence: Secondary | ICD-10-CM | POA: Diagnosis not present

## 2023-09-10 DIAGNOSIS — M545 Low back pain, unspecified: Secondary | ICD-10-CM | POA: Diagnosis present

## 2023-09-10 DIAGNOSIS — N12 Tubulo-interstitial nephritis, not specified as acute or chronic: Secondary | ICD-10-CM | POA: Diagnosis not present

## 2023-09-10 DIAGNOSIS — R41841 Cognitive communication deficit: Secondary | ICD-10-CM | POA: Diagnosis not present

## 2023-09-10 DIAGNOSIS — A4159 Other Gram-negative sepsis: Secondary | ICD-10-CM | POA: Diagnosis not present

## 2023-09-10 DIAGNOSIS — E1122 Type 2 diabetes mellitus with diabetic chronic kidney disease: Secondary | ICD-10-CM | POA: Diagnosis present

## 2023-09-10 DIAGNOSIS — R059 Cough, unspecified: Secondary | ICD-10-CM | POA: Diagnosis not present

## 2023-09-10 DIAGNOSIS — N179 Acute kidney failure, unspecified: Secondary | ICD-10-CM | POA: Diagnosis not present

## 2023-09-10 DIAGNOSIS — R0989 Other specified symptoms and signs involving the circulatory and respiratory systems: Secondary | ICD-10-CM | POA: Diagnosis not present

## 2023-09-10 DIAGNOSIS — R2689 Other abnormalities of gait and mobility: Secondary | ICD-10-CM | POA: Diagnosis not present

## 2023-09-10 DIAGNOSIS — N289 Disorder of kidney and ureter, unspecified: Secondary | ICD-10-CM | POA: Diagnosis present

## 2023-09-10 DIAGNOSIS — Z8546 Personal history of malignant neoplasm of prostate: Secondary | ICD-10-CM

## 2023-09-10 DIAGNOSIS — Z7401 Bed confinement status: Secondary | ICD-10-CM | POA: Diagnosis not present

## 2023-09-10 DIAGNOSIS — R471 Dysarthria and anarthria: Secondary | ICD-10-CM | POA: Diagnosis not present

## 2023-09-10 DIAGNOSIS — Z8249 Family history of ischemic heart disease and other diseases of the circulatory system: Secondary | ICD-10-CM | POA: Diagnosis not present

## 2023-09-10 DIAGNOSIS — Z803 Family history of malignant neoplasm of breast: Secondary | ICD-10-CM

## 2023-09-10 DIAGNOSIS — N2889 Other specified disorders of kidney and ureter: Secondary | ICD-10-CM | POA: Diagnosis not present

## 2023-09-10 DIAGNOSIS — Z7984 Long term (current) use of oral hypoglycemic drugs: Secondary | ICD-10-CM

## 2023-09-10 DIAGNOSIS — N1832 Chronic kidney disease, stage 3b: Secondary | ICD-10-CM | POA: Diagnosis present

## 2023-09-10 DIAGNOSIS — N400 Enlarged prostate without lower urinary tract symptoms: Secondary | ICD-10-CM | POA: Diagnosis present

## 2023-09-10 DIAGNOSIS — A419 Sepsis, unspecified organism: Secondary | ICD-10-CM | POA: Diagnosis not present

## 2023-09-10 DIAGNOSIS — R531 Weakness: Secondary | ICD-10-CM

## 2023-09-10 DIAGNOSIS — R918 Other nonspecific abnormal finding of lung field: Secondary | ICD-10-CM | POA: Diagnosis not present

## 2023-09-10 DIAGNOSIS — R0689 Other abnormalities of breathing: Secondary | ICD-10-CM | POA: Diagnosis not present

## 2023-09-10 DIAGNOSIS — R488 Other symbolic dysfunctions: Secondary | ICD-10-CM | POA: Diagnosis not present

## 2023-09-10 DIAGNOSIS — Z882 Allergy status to sulfonamides status: Secondary | ICD-10-CM

## 2023-09-10 DIAGNOSIS — M6281 Muscle weakness (generalized): Secondary | ICD-10-CM | POA: Diagnosis not present

## 2023-09-10 DIAGNOSIS — G20B1 Parkinson's disease with dyskinesia, without mention of fluctuations: Secondary | ICD-10-CM | POA: Diagnosis not present

## 2023-09-10 LAB — COMPREHENSIVE METABOLIC PANEL
ALT: 19 U/L (ref 0–44)
AST: 26 U/L (ref 15–41)
Albumin: 3 g/dL — ABNORMAL LOW (ref 3.5–5.0)
Alkaline Phosphatase: 34 U/L — ABNORMAL LOW (ref 38–126)
Anion gap: 8 (ref 5–15)
BUN: 38 mg/dL — ABNORMAL HIGH (ref 8–23)
CO2: 21 mmol/L — ABNORMAL LOW (ref 22–32)
Calcium: 9.3 mg/dL (ref 8.9–10.3)
Chloride: 104 mmol/L (ref 98–111)
Creatinine, Ser: 2.93 mg/dL — ABNORMAL HIGH (ref 0.61–1.24)
GFR, Estimated: 21 mL/min — ABNORMAL LOW (ref >60)
Glucose, Bld: 100 mg/dL — ABNORMAL HIGH (ref 70–99)
Potassium: 4.4 mmol/L (ref 3.5–5.1)
Sodium: 133 mmol/L — ABNORMAL LOW (ref 135–145)
Total Bilirubin: 1.2 mg/dL (ref 0.3–1.2)
Total Protein: 5.7 g/dL — ABNORMAL LOW (ref 6.5–8.1)

## 2023-09-10 LAB — CBC WITH DIFFERENTIAL/PLATELET
Abs Immature Granulocytes: 0.31 10*3/uL — ABNORMAL HIGH (ref 0.00–0.07)
Basophils Absolute: 0 10*3/uL (ref 0.0–0.1)
Basophils Relative: 0 %
Eosinophils Absolute: 0 10*3/uL (ref 0.0–0.5)
Eosinophils Relative: 0 %
HCT: 34.2 % — ABNORMAL LOW (ref 39.0–52.0)
Hemoglobin: 11.3 g/dL — ABNORMAL LOW (ref 13.0–17.0)
Immature Granulocytes: 2 %
Lymphocytes Relative: 2 %
Lymphs Abs: 0.4 10*3/uL — ABNORMAL LOW (ref 0.7–4.0)
MCH: 31.2 pg (ref 26.0–34.0)
MCHC: 33 g/dL (ref 30.0–36.0)
MCV: 94.5 fL (ref 80.0–100.0)
Monocytes Absolute: 1.3 10*3/uL — ABNORMAL HIGH (ref 0.1–1.0)
Monocytes Relative: 7 %
Neutro Abs: 15.9 10*3/uL — ABNORMAL HIGH (ref 1.7–7.7)
Neutrophils Relative %: 89 %
Platelets: 147 10*3/uL — ABNORMAL LOW (ref 150–400)
RBC: 3.62 MIL/uL — ABNORMAL LOW (ref 4.22–5.81)
RDW: 14 % (ref 11.5–15.5)
WBC Morphology: INCREASED
WBC: 17.9 10*3/uL — ABNORMAL HIGH (ref 4.0–10.5)
nRBC: 0 % (ref 0.0–0.2)

## 2023-09-10 LAB — URINALYSIS, ROUTINE W REFLEX MICROSCOPIC
Bilirubin Urine: NEGATIVE
Glucose, UA: NEGATIVE mg/dL
Ketones, ur: NEGATIVE mg/dL
Nitrite: POSITIVE — AB
Protein, ur: >=300 mg/dL — AB
RBC / HPF: >50 RBC/hpf (ref 0–5)
Specific Gravity, Urine: 1.017 (ref 1.005–1.030)
WBC, UA: >50 WBC/hpf (ref 0–5)
pH: 6 (ref 5.0–8.0)

## 2023-09-10 MED ORDER — SODIUM CHLORIDE 0.9 % IV BOLUS
1000.0000 mL | Freq: Once | INTRAVENOUS | Status: AC
  Administered 2023-09-10: 1000 mL via INTRAVENOUS

## 2023-09-10 NOTE — ED Triage Notes (Signed)
Pt arrived via EMS from home for Weakness on set 2 days ago. 1 episode of vomiting yesterday.  20 L AC

## 2023-09-10 NOTE — ED Notes (Signed)
ED TO INPATIENT HANDOFF REPORT  ED Nurse Name and Phone #: Linus Orn Name/Age/Gender Scott Wang 82 y.o. male Room/Bed: WA23/WA23  Code Status   Code Status: Not on file  Home/SNF/Other Home Patient oriented to: self, place, time, and situation Is this baseline? Yes   Triage Complete: Triage complete  Chief Complaint AKI (acute kidney injury) (HCC) [N17.9]  Triage Note Pt arrived via EMS from home for Weakness on set 2 days ago. 1 episode of vomiting yesterday.  20 L AC    Allergies Allergies  Allergen Reactions   Sulfa Antibiotics Swelling and Rash    Level of Care/Admitting Diagnosis ED Disposition     ED Disposition  Admit   Condition  --   Comment  Hospital Area: [facility] [[id number]]  Level of Care: Progressive [102]  Admit to Progressive based on following criteria: MULTISYSTEM THREATS such as stable sepsis, metabolic/electrolyte imbalance with or without encephalopathy that is responding to early treatment.  Admit to Progressive based on following criteria: NEPHROLOGY stable condition requiring close monitoring for AKI, requiring Hemodialysis or Peritoneal Dialysis either from expected electrolyte imbalance, acidosis, or fluid overload that can be managed by NIPPV or high flow oxygen.  May admit patient to [facility] or [facility] if equivalent level of care is available:: Yes  Covid Evaluation: Asymptomatic - no recent exposure (last 10 days) testing not required  Diagnosis: AKI (acute kidney injury) [facility]) [[id number]]  Admitting Physician: Darlin Drop [[id number]]  Attending Physician: Darlin Drop [[id number]]  Certification:: I certify this patient will need inpatient services for at least 2 midnights  Expected Medical Readiness: 09/12/2023          B Medical/Surgery History Past Medical History:  Diagnosis Date   Carpal tunnel syndrome    bilateral, had shots in each one and "haven't had any problems since".   Diabetes  mellitus    Hyperlipidemia    Hypertension    Prostate cancer (HCC)    Spinal stenosis    Past Surgical History:  Procedure Laterality Date   HERNIA REPAIR     HERNIA REPAIR     INGUINAL HERNIA REPAIR     KNEE SURGERY  right   KNEE SURGERY Left    PROSTATE BIOPSY     RADIOACTIVE SEED IMPLANT N/A 09/07/2018   Procedure: RADIOACTIVE SEED IMPLANT/BRACHYTHERAPY IMPLANT;  Surgeon: Jerilee Field, MD;  Location: [facility];  Service: Urology;  Laterality: N/A;   SPACE OAR INSTILLATION N/A 09/07/2018   Procedure: SPACE OAR INSTILLATION;  Surgeon: Jerilee Field, MD;  Location: [facility];  Service: Urology;  Laterality: N/A;     A IV Location/Drains/Wounds Patient Lines/Drains/Airways Status     Active Line/Drains/Airways     Name Placement date Placement time Site Days   Peripheral IV 09/10/23 20 G 1" Anterior;Distal;Left;Upper Arm 09/10/23  --  Arm  less than 1   External Urinary Catheter 09/10/23  --  --  less than 1            Intake/Output Last 24 hours No intake or output data in the 24 hours ending 09/10/23 2332  Labs/Imaging Results for orders placed or performed during the hospital encounter of 09/10/23 (from the past 48 hour(s))  CBC with Differential     Status: Abnormal   Collection Time: 09/10/23  7:47 PM  Result Value Ref Range   WBC 17.9 (H) 4.0 - 10.5 K/uL   RBC 3.62 (L) 4.22 - 5.81 MIL/uL  Hemoglobin 11.3 (L) 13.0 - 17.0 g/dL   HCT 56.2 (L) 13.0 - 86.5 %   MCV 94.5 80.0 - 100.0 fL   MCH 31.2 26.0 - 34.0 pg   MCHC 33.0 30.0 - 36.0 g/dL   RDW 78.4 69.6 - 29.5 %   Platelets 147 (L) 150 - 400 K/uL   nRBC 0.0 0.0 - 0.2 %   Neutrophils Relative % 89 %   Neutro Abs 15.9 (H) 1.7 - 7.7 K/uL   Lymphocytes Relative 2 %   Lymphs Abs 0.4 (L) 0.7 - 4.0 K/uL   Monocytes Relative 7 %   Monocytes Absolute 1.3 (H) 0.1 - 1.0 K/uL   Eosinophils Relative 0 %   Eosinophils Absolute 0.0 0.0 - 0.5 K/uL   Basophils Relative 0 %    Basophils Absolute 0.0 0.0 - 0.1 K/uL   WBC Morphology INCREASED BANDS (>20% BANDS)     Comment: Mild Left Shift (1-5% metas, occ myelo)   Immature Granulocytes 2 %   Abs Immature Granulocytes 0.31 (H) 0.00 - 0.07 K/uL    Comment: Performed at [facility], [address] [place], [place] [address]  Comprehensive metabolic panel     Status: Abnormal   Collection Time: 09/10/23  7:47 PM  Result Value Ref Range   Sodium 133 (L) 135 - 145 mmol/L   Potassium 4.4 3.5 - 5.1 mmol/L   Chloride 104 98 - 111 mmol/L   CO2 21 (L) 22 - 32 mmol/L   Glucose, Bld 100 (H) 70 - 99 mg/dL    Comment: Glucose reference range applies only to samples taken after fasting for at least 8 hours.   BUN 38 (H) 8 - 23 mg/dL   Creatinine, Ser 2.44 (H) 0.61 - 1.24 mg/dL   Calcium 9.3 8.9 - 01.0 mg/dL   Total Protein 5.7 (L) 6.5 - 8.1 g/dL   Albumin 3.0 (L) 3.5 - 5.0 g/dL   AST 26 15 - 41 U/L   ALT 19 0 - 44 U/L   Alkaline Phosphatase 34 (L) 38 - 126 U/L   Total Bilirubin 1.2 0.3 - 1.2 mg/dL   GFR, Estimated 21 (L) >60 mL/min    Comment: (NOTE) Calculated using the CKD-EPI Creatinine Equation (2021)    Anion gap 8 5 - 15    Comment: Performed at [facility], [address], [place], [place] [address]   DG Chest Port 1 View  Result Date: 09/10/2023 CLINICAL DATA:  Weakness. EXAM: PORTABLE CHEST 1 VIEW COMPARISON:  September 10, 2021 FINDINGS: The heart size and mediastinal contours are within normal limits. Mild atelectasis is seen within the bilateral lung bases. There is no evidence of a pleural effusion or pneumothorax. The visualized skeletal structures are unremarkable. IMPRESSION: Mild bibasilar atelectasis. Electronically Signed   By: Aram Candela M.D.   On: 09/10/2023 22:11    Pending Labs Unresulted Labs (From admission, onward)     Start     Ordered   09/10/23 2242  Blood culture (routine x 2)  BLOOD CULTURE X 2,   R (with TIMED occurrences)      09/10/23  2241   09/10/23 2159  Urinalysis, Routine w reflex microscopic -Urine, Clean Catch  Once,   URGENT       Question:  Specimen Source  Answer:  Urine, Clean Catch   09/10/23 2158            Vitals/Pain Today's Vitals   09/10/23 2003 09/10/23 2030 09/10/23 2100  09/10/23 2124  BP: (!) 112/54 (!) 107/54 (!) 100/52   Pulse: 99 97 95   Resp: 20 20 (!) 21   Temp:      TempSrc:      SpO2: 94% 98% 94%   PainSc:    0-No pain    Isolation Precautions No active isolations  Medications Medications  sodium chloride 0.9 % bolus 1,000 mL (has no administration in time range)    Mobility walks     Focused Assessments UTI/Weakness   R Recommendations: See Admitting Provider Note  Report given to:   Additional Notes: aaox4, pt normally walks but feels too weak to do so. Pt reports a fall out of bed today. Pt has parkinson disease. Spouse at bedside.

## 2023-09-10 NOTE — ED Provider Notes (Signed)
[facility] EMERGENCY DEPARTMENT AT [facility] Provider Note   CSN: [id number] Arrival date & time: 09/10/23  1947     History  Chief Complaint  Patient presents with   Weakness    Scott Wang is a 82 y.o. male.  Patient has severe Parkinson's disease.  Today he felt so weak he could not get out of bed.  Just general weakness.  His wife stated that he vomited once yesterday and did complain of some left-sided back pain but now he is not having any discomfort  The history is provided by a relative and the patient. No language interpreter was used.  Weakness Severity:  Severe Onset quality:  Sudden Timing:  Constant Progression:  Worsening Chronicity:  New Context: not alcohol use   Relieved by:  Nothing Worsened by:  Nothing Ineffective treatments:  None tried Associated symptoms: no abdominal pain, no chest pain, no cough, no diarrhea, no frequency, no headaches and no seizures        Home Medications Prior to Admission medications   Medication Sig Start Date End Date Taking? Authorizing Provider  alfuzosin (UROXATRAL) 10 MG 24 hr tablet Take 10 mg by mouth daily with breakfast.   Yes [provider]  atorvastatin (LIPITOR) 10 MG tablet Take 10 mg by mouth at bedtime. 01/03/17  Yes [provider]  carbidopa-levodopa (SINEMET IR) 25-100 MG tablet TAKE 1 TABLET BY MOUTH TWICE A DAY Patient taking differently: Take 1 tablet by mouth See admin instructions. Take 1 tablet by mouth in the morning, at 2 PM, and between 8-9 PM 07/16/23  Yes Anson Fret, MD  carvedilol (COREG) 3.125 MG tablet Take 3.125 mg by mouth 2 (two) times daily with a meal. 01/21/17  Yes [provider]  Cyanocobalamin (VITAMIN B-12 PO) Take 2,500 mcg by mouth daily.   Yes [provider]  donepezil (ARICEPT) 5 MG tablet TAKE 1 TABLET BY MOUTH EVERYDAY AT BEDTIME Patient taking differently: Take 5 mg by mouth at bedtime. 11/24/22  Yes Anson Fret, MD   HYDROcodone-acetaminophen (NORCO/VICODIN) 5-325 MG tablet Take 1 tablet by mouth every 6 (six) hours as needed (for pain).   Yes [provider]  losartan (COZAAR) 100 MG tablet Take 100 mg by mouth daily. 11/06/16  Yes [provider]  metFORMIN (GLUCOPHAGE) 500 MG tablet Take 500 mg by mouth 2 (two) times daily with a meal. 11/06/16  Yes [provider]  Multiple Vitamins-Minerals (ONE-A-DAY 50 PLUS PO) Take 1 tablet by mouth daily with breakfast.   Yes [provider]  TYLENOL 500 MG tablet Take 500 mg by mouth 2 (two) times daily as needed for mild pain (pain score 1-3) or headache.   Yes [provider]  Dola Argyle LANCETS FINE MISC  01/01/17   [provider]      Allergies    Sulfa antibiotics    Review of Systems   Review of Systems  Constitutional:  Negative for appetite change and fatigue.  HENT:  Negative for congestion, ear discharge and sinus pressure.   Eyes:  Negative for discharge.  Respiratory:  Negative for cough.   Cardiovascular:  Negative for chest pain.  Gastrointestinal:  Negative for abdominal pain and diarrhea.  Genitourinary:  Negative for frequency and hematuria.  Musculoskeletal:  Negative for back pain.  Skin:  Negative for rash.  Neurological:  Positive for weakness. Negative for seizures and headaches.  Psychiatric/Behavioral:  Negative for hallucinations.     Physical Exam  Updated Vital Signs BP (!) 100/52   Pulse 95   Temp 98.6 F (37 C) (Oral)   Resp (!) 21   SpO2 94%  Physical Exam Vitals and nursing note reviewed.  Constitutional:      Appearance: He is well-developed.  HENT:     Head: Normocephalic.     Nose: Nose normal.  Eyes:     General: No scleral icterus.    Conjunctiva/sclera: Conjunctivae normal.  Neck:     Thyroid: No thyromegaly.  Cardiovascular:     Rate and Rhythm: Normal rate and regular rhythm.     Heart sounds: No murmur heard.    No friction rub. No gallop.   Pulmonary:     Breath sounds: No stridor. No wheezing or rales.  Chest:     Chest wall: No tenderness.  Abdominal:     General: There is no distension.     Tenderness: There is no abdominal tenderness. There is no rebound.  Musculoskeletal:     Cervical back: Neck supple.     Comments: Patient has general weakness and can move all extremities but not ambulate  Lymphadenopathy:     Cervical: No cervical adenopathy.  Skin:    Findings: No erythema or rash.  Neurological:     Mental Status: He is alert and oriented to person, place, and time.     Motor: No abnormal muscle tone.     Coordination: Coordination normal.  Psychiatric:        Behavior: Behavior normal.     ED Results / Procedures / Treatments   Labs (all labs ordered are listed, but only abnormal results are displayed) Labs Reviewed  CBC WITH DIFFERENTIAL/PLATELET - Abnormal; Notable for the following components:      Result Value   WBC 17.9 (*)    RBC 3.62 (*)    Hemoglobin 11.3 (*)    HCT 34.2 (*)    Platelets 147 (*)    Neutro Abs 15.9 (*)    Lymphs Abs 0.4 (*)    Monocytes Absolute 1.3 (*)    Abs Immature Granulocytes 0.31 (*)    All other components within normal limits  COMPREHENSIVE METABOLIC PANEL - Abnormal; Notable for the following components:   Sodium 133 (*)    CO2 21 (*)    Glucose, Bld 100 (*)    BUN 38 (*)    Creatinine, Ser 2.93 (*)    Total Protein 5.7 (*)    Albumin 3.0 (*)    Alkaline Phosphatase 34 (*)    GFR, Estimated 21 (*)    All other components within normal limits  URINALYSIS, ROUTINE W REFLEX MICROSCOPIC    EKG None  Radiology DG Chest Port 1 View  Result Date: 09/10/2023 CLINICAL DATA:  Weakness. EXAM: PORTABLE CHEST 1 VIEW COMPARISON:  September 10, 2021 FINDINGS: The heart size and mediastinal contours are within normal limits. Mild atelectasis is seen within the bilateral lung bases. There is no evidence of a pleural effusion or pneumothorax. The visualized skeletal  structures are unremarkable. IMPRESSION: Mild bibasilar atelectasis. Electronically Signed   By: Aram Candela M.D.   On: 09/10/2023 22:11    Procedures Procedures    Medications Ordered in ED Medications  sodium chloride 0.9 % bolus 1,000 mL (has no administration in time range)  CRITICAL CARE Performed by: Bethann Berkshire Total critical care time: 55 minutes Critical care time was exclusive of separately billable procedures and treating other patients. Critical care was necessary to treat or  prevent imminent or life-threatening deterioration. Critical care was time spent personally by me on the following activities: development of treatment plan with patient and/or surrogate as well as nursing, discussions with consultants, evaluation of patient's response to treatment, examination of patient, obtaining history from patient or surrogate, ordering and performing treatments and interventions, ordering and review of laboratory studies, ordering and review of radiographic studies, pulse oximetry and re-evaluation of patient's condition.   ED Course/ Medical Decision Making/ A&P                                 Medical Decision Making Amount and/or Complexity of Data Reviewed Labs: ordered. Radiology: ordered.  Risk Decision regarding hospitalization.  This patient presents to the ED for concern of weakness, this involves an extensive number of treatment options, and is a complaint that carries with it a high risk of complications and morbidity.  The differential diagnosis includes Parkinson's, sepsis   Co morbidities that complicate the patient evaluation  Parkinson    Additional history obtained:  Additional history obtained from wife External records from outside source obtained and reviewed including hospital records   Lab Tests:  I Ordered, and personally interpreted labs.  The pertinent results include: White count 17.9 and urine appears infected   Imaging Studies  ordered:  I ordered imaging studies including chest x-ray I independently visualized and interpreted imaging which showed negative I agree with the radiologist interpretation   Cardiac Monitoring: / EKG:  The patient was maintained on a cardiac monitor.  I personally viewed and interpreted the cardiac monitored which showed an underlying rhythm of: Normal sinus rhythm   Consultations Obtained:  I requested consultation with the [facility] list,  and discussed lab and imaging findings as well as pertinent plan - they recommend: Admit   Problem List / ED Course / Critical interventions / Medication management  UTI and AKI and Parkinson's I ordered medication including antibiotic for sepsis Reevaluation of the patient after these medicines showed that the patient improved I have reviewed the patients home medicines and have made adjustments as needed   Social Determinants of Health:  None   Test / Admission - Considered:  None  Patient with an AKI and possible sepsis from UTI.  he will be admitted to medicine        Final Clinical Impression(s) / ED Diagnoses Final diagnoses:  AKI (acute kidney injury) [facility])    Rx / DC Orders ED Discharge Orders     None         Bethann Berkshire, MD 09/15/23 1124

## 2023-09-11 ENCOUNTER — Inpatient Hospital Stay (HOSPITAL_COMMUNITY): Payer: Medicare PPO

## 2023-09-11 ENCOUNTER — Encounter (HOSPITAL_COMMUNITY): Payer: Self-pay | Admitting: Internal Medicine

## 2023-09-11 DIAGNOSIS — N179 Acute kidney failure, unspecified: Secondary | ICD-10-CM | POA: Diagnosis not present

## 2023-09-11 LAB — BLOOD CULTURE ID PANEL (REFLEXED) - BCID2

## 2023-09-11 LAB — RENAL FUNCTION PANEL
Albumin: 2.9 g/dL — ABNORMAL LOW (ref 3.5–5.0)
Anion gap: 13 (ref 5–15)
BUN: 46 mg/dL — ABNORMAL HIGH (ref 8–23)
CO2: 19 mmol/L — ABNORMAL LOW (ref 22–32)
Calcium: 9.1 mg/dL (ref 8.9–10.3)
Chloride: 102 mmol/L (ref 98–111)
Creatinine, Ser: 3.01 mg/dL — ABNORMAL HIGH (ref 0.61–1.24)
GFR, Estimated: 20 mL/min — ABNORMAL LOW
Glucose, Bld: 101 mg/dL — ABNORMAL HIGH (ref 70–99)
Phosphorus: 3.8 mg/dL (ref 2.5–4.6)
Potassium: 4.5 mmol/L (ref 3.5–5.1)
Sodium: 134 mmol/L — ABNORMAL LOW (ref 135–145)

## 2023-09-11 LAB — CBC
HCT: 35.5 % — ABNORMAL LOW (ref 39.0–52.0)
Hemoglobin: 11.4 g/dL — ABNORMAL LOW (ref 13.0–17.0)
MCH: 30.9 pg (ref 26.0–34.0)
MCHC: 32.1 g/dL (ref 30.0–36.0)
MCV: 96.2 fL (ref 80.0–100.0)
Platelets: 147 K/uL — ABNORMAL LOW (ref 150–400)
RBC: 3.69 MIL/uL — ABNORMAL LOW (ref 4.22–5.81)
RDW: 14.3 % (ref 11.5–15.5)
WBC: 15.3 K/uL — ABNORMAL HIGH (ref 4.0–10.5)
nRBC: 0 % (ref 0.0–0.2)

## 2023-09-11 MED ORDER — ACETAMINOPHEN 325 MG PO TABS
650.0000 mg | ORAL_TABLET | Freq: Four times a day (QID) | ORAL | Status: DC | PRN
  Administered 2023-09-12 – 2023-09-17 (×6): 650 mg via ORAL
  Filled 2023-09-11 (×6): qty 2

## 2023-09-11 MED ORDER — MELATONIN 5 MG PO TABS
5.0000 mg | ORAL_TABLET | Freq: Every evening | ORAL | Status: DC | PRN
  Administered 2023-09-11 – 2023-09-16 (×6): 5 mg via ORAL
  Filled 2023-09-11 (×6): qty 1

## 2023-09-11 MED ORDER — SODIUM CHLORIDE 0.9 % IV SOLN
2.0000 g | INTRAVENOUS | Status: DC
  Administered 2023-09-11 – 2023-09-15 (×6): 2 g via INTRAVENOUS
  Filled 2023-09-11 (×6): qty 20

## 2023-09-11 MED ORDER — SODIUM CHLORIDE 0.9 % IV SOLN: INTRAVENOUS | Status: AC

## 2023-09-11 MED ORDER — ATORVASTATIN CALCIUM 10 MG PO TABS
10.0000 mg | ORAL_TABLET | Freq: Every day | ORAL | Status: DC
  Administered 2023-09-11 – 2023-09-16 (×6): 10 mg via ORAL
  Filled 2023-09-11 (×6): qty 1

## 2023-09-11 MED ORDER — ENOXAPARIN SODIUM 30 MG/0.3ML IJ SOSY
30.0000 mg | PREFILLED_SYRINGE | INTRAMUSCULAR | Status: DC
  Administered 2023-09-11 – 2023-09-17 (×7): 30 mg via SUBCUTANEOUS
  Filled 2023-09-11 (×7): qty 0.3

## 2023-09-11 MED ORDER — CARBIDOPA-LEVODOPA 25-100 MG PO TABS
1.0000 | ORAL_TABLET | Freq: Two times a day (BID) | ORAL | Status: DC
Start: 2023-09-11 — End: 2023-09-11
  Administered 2023-09-11: 1 via ORAL
  Filled 2023-09-11 (×2): qty 1

## 2023-09-11 MED ORDER — OXYCODONE HCL 5 MG/5ML PO SOLN
5.0000 mg | ORAL | Status: DC | PRN
  Administered 2023-09-14: 5 mg via ORAL
  Filled 2023-09-11: qty 5

## 2023-09-11 MED ORDER — MIDODRINE HCL 5 MG PO TABS
10.0000 mg | ORAL_TABLET | Freq: Three times a day (TID) | ORAL | Status: AC
  Administered 2023-09-11 (×3): 10 mg via ORAL
  Filled 2023-09-11 (×3): qty 2

## 2023-09-11 MED ORDER — MIDODRINE HCL 5 MG PO TABS
10.0000 mg | ORAL_TABLET | ORAL | Status: AC
  Administered 2023-09-11: 10 mg via ORAL
  Filled 2023-09-11: qty 2

## 2023-09-11 MED ORDER — ALFUZOSIN HCL ER 10 MG PO TB24
10.0000 mg | ORAL_TABLET | Freq: Every day | ORAL | Status: DC
  Administered 2023-09-11 – 2023-09-17 (×7): 10 mg via ORAL
  Filled 2023-09-11 (×7): qty 1

## 2023-09-11 MED ORDER — PROCHLORPERAZINE EDISYLATE 10 MG/2ML IJ SOLN: 5.0000 mg | Freq: Four times a day (QID) | INTRAMUSCULAR | Status: DC | PRN

## 2023-09-11 MED ORDER — VITAMIN B-12 1000 MCG PO TABS
2500.0000 ug | ORAL_TABLET | Freq: Every day | ORAL | Status: DC
  Administered 2023-09-11 – 2023-09-17 (×7): 2500 ug via ORAL
  Filled 2023-09-11 (×7): qty 1

## 2023-09-11 MED ORDER — DONEPEZIL HCL 5 MG PO TABS
5.0000 mg | ORAL_TABLET | Freq: Every day | ORAL | Status: DC
  Administered 2023-09-11 – 2023-09-16 (×6): 5 mg via ORAL
  Filled 2023-09-11 (×6): qty 1

## 2023-09-11 MED ORDER — CARBIDOPA-LEVODOPA 25-100 MG PO TABS
1.0000 | ORAL_TABLET | Freq: Three times a day (TID) | ORAL | Status: DC
  Administered 2023-09-11 – 2023-09-17 (×19): 1 via ORAL
  Filled 2023-09-11 (×19): qty 1

## 2023-09-11 MED ORDER — MIDODRINE HCL 5 MG PO TABS: 10.0000 mg | ORAL_TABLET | Freq: Three times a day (TID) | ORAL | Status: DC

## 2023-09-11 MED ORDER — POLYETHYLENE GLYCOL 3350 17 G PO PACK
17.0000 g | PACK | Freq: Every day | ORAL | Status: DC | PRN
  Administered 2023-09-14 – 2023-09-16 (×2): 17 g via ORAL
  Filled 2023-09-11 (×2): qty 1

## 2023-09-11 NOTE — Plan of Care (Signed)
  Problem: Coping: Goal: Level of anxiety will decrease Outcome: Progressing   Problem: Pain Management: Goal: General experience of comfort will improve Outcome: Progressing   Problem: Safety: Goal: Ability to remain free from injury will improve Outcome: Progressing   Problem: Skin Integrity: Goal: Risk for impaired skin integrity will decrease Outcome: Progressing

## 2023-09-11 NOTE — Evaluation (Signed)
Physical Therapy Evaluation Patient Details Name: Scott Wang MRN: [id number] DOB: [birth year] Today's Date: 09/11/2023  History of Present Illness  82 y.o. 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  Pt admitted on 09/10/23 for AKI and UTI  Clinical Impression  Pt admitted with above diagnosis.  Pt currently with functional limitations due to the deficits listed below (see PT Problem List). Pt will benefit from acute skilled PT to increase their independence and safety with mobility to allow discharge.  Pt presents with decreased cognition, mobility, strength, and balance.  Pt typically able to ambulate without assistive device and had been working with neuro OPPT however appears he preferred to self discharge since he was progressing well and had HEP.  Pt currently requiring mod-max assist for bed mobility and transfers.  Pt would benefit from post acute rehab upon d/c.         If plan is discharge home, recommend the following: Two people to help with walking and/or transfers;Two people to help with bathing/dressing/bathroom;Assistance with cooking/housework;Assist for transportation;Help with stairs or ramp for entrance   Can travel by private vehicle        Equipment Recommendations Rolling walker (2 wheels)  Recommendations for Other Services       Functional Status Assessment Patient has had a recent decline in their functional status and demonstrates the ability to make significant improvements in function in a reasonable and predictable amount of time.     Precautions / Restrictions Precautions Precautions: Fall       Mobility  Bed Mobility Overal bed mobility: Needs Assistance Bed Mobility: Supine to Sit     Supine to sit: HOB elevated, Max assist     General bed mobility comments: assist for upper and lower body required    Transfers Overall transfer level: Needs assistance Equipment used: Rolling walker (2 wheels) Transfers: Sit to/from Stand, Bed to chair/wheelchair/BSC Sit to Stand: Mod assist, From elevated surface   Step pivot transfers: Mod assist       General transfer comment: required increased assist to rise, stabilize and control descent; pt with freezing episode initially but able to take a few steps over to recliner with cues for "big steps"    Ambulation/Gait                  Stairs            Wheelchair Mobility     Tilt Bed    Modified Rankin (Stroke Patients Only)       Balance Overall balance assessment: History of Falls                                           Pertinent Vitals/Pain Pain Assessment Pain Assessment: No/denies pain    Home Living Family/patient expects to be discharged to:: Private residence Living Arrangements: Spouse/significant other   Type of Home: House Home Access: Stairs to enter Entrance Stairs-Rails: Right Entrance Stairs-Number of Steps: 3   Home Layout: One level Home Equipment: Agricultural consultant (2 wheels);Rollator (4 wheels);Cane - single point  Prior Function Prior Level of Function : Independent/Modified Independent                     Extremity/Trunk Assessment        Lower Extremity Assessment Lower Extremity Assessment: Generalized weakness    Cervical / Trunk Assessment Cervical / Trunk Assessment: Kyphotic  Communication   Communication Communication: Hearing impairment  Cognition Arousal: Alert Behavior During Therapy: Flat affect Overall Cognitive Status: Impaired/Different from baseline Area of Impairment: Following commands                        Following Commands: Follows one step commands with increased time       General Comments: spouse present and reports pt is more quiet and not himself        General Comments      Exercises     Assessment/Plan    PT Assessment Patient needs continued PT services  PT Problem List Decreased strength;Decreased activity tolerance;Decreased mobility;Decreased balance;Decreased coordination;Decreased knowledge of use of DME       PT Treatment Interventions DME instruction;Balance training;Gait training;Neuromuscular re-education;Functional mobility training;Therapeutic activities;Therapeutic exercise;Stair training;Patient/family education    PT Goals (Current goals can be found in the Care Plan section)  Acute Rehab PT Goals PT Goal Formulation: With patient/family Time For Goal Achievement: 09/25/23 Potential to Achieve Goals: Good    Frequency Min 1X/week     Co-evaluation               AM-PAC PT "6 Clicks" Mobility  Outcome Measure Help needed turning from your back to your side while in a flat bed without using bedrails?: A Lot Help needed moving from lying on your back to sitting on the side of a flat bed without using bedrails?: Total Help needed moving to and from a bed to a chair (including a wheelchair)?: Total Help needed standing up from a chair using your arms (e.g., wheelchair or bedside chair)?: Total Help needed to walk in hospital room?: Total Help needed climbing 3-5 steps with a railing? : Total 6 Click Score: 7    End of Session Equipment Utilized During Treatment: Gait belt Activity Tolerance: Patient tolerated treatment well Patient left: in chair;with call bell/phone within reach;with family/visitor present;with chair alarm set Nurse Communication: Mobility status PT Visit Diagnosis: Other symptoms and signs involving the nervous system (R29.898);Other abnormalities of gait and mobility (R26.89)    Time: 1021-1040 PT Time  Calculation (min) (ACUTE ONLY): 19 min   Charges:   PT Evaluation $PT Eval Moderate Complexity: 1 Mod   PT General Charges $$ ACUTE PT VISIT: 1 Visit        Kati PT, DPT Physical Therapist Acute Rehabilitation Services Office: [phone]   Janan Halter [place] 09/11/2023, 12:38 PM

## 2023-09-11 NOTE — Progress Notes (Signed)
PHARMACY - PHYSICIAN COMMUNICATION CRITICAL VALUE ALERT - BLOOD CULTURE IDENTIFICATION (BCID)  Scott Wang is an 82 y.o. male who presented to [facility] on 09/10/2023 with a chief complaint of weakness.  Assessment:  Pt currently on antibiotics for UTI.  BCID + 1/4 Klebsiella oxytoca - no resistance reported  Name of physician (or Provider) Contacted: Dr. Blake Divine  Current antibiotics: Ceftriaxone 2 g IV q24h  Changes to prescribed antibiotics recommended: None - pt on appropriate antibiotics.   Results for orders placed or performed during the hospital encounter of 09/10/23  Blood Culture ID Panel (Reflexed) (Collected: 09/10/2023 11:00 PM)  Result Value Ref Range   Enterococcus faecalis NOT DETECTED NOT DETECTED   Enterococcus Faecium NOT DETECTED NOT DETECTED   Listeria monocytogenes NOT DETECTED NOT DETECTED   Staphylococcus species NOT DETECTED NOT DETECTED   Staphylococcus aureus (BCID) NOT DETECTED NOT DETECTED   Staphylococcus epidermidis NOT DETECTED NOT DETECTED   Staphylococcus lugdunensis NOT DETECTED NOT DETECTED   Streptococcus species NOT DETECTED NOT DETECTED   Streptococcus agalactiae NOT DETECTED NOT DETECTED   Streptococcus pneumoniae NOT DETECTED NOT DETECTED   Streptococcus pyogenes NOT DETECTED NOT DETECTED   A.calcoaceticus-baumannii NOT DETECTED NOT DETECTED   Bacteroides fragilis NOT DETECTED NOT DETECTED   Enterobacterales DETECTED (A) NOT DETECTED   Enterobacter cloacae complex NOT DETECTED NOT DETECTED   Escherichia coli NOT DETECTED NOT DETECTED   Klebsiella aerogenes NOT DETECTED NOT DETECTED   Klebsiella oxytoca DETECTED (A) NOT DETECTED   Klebsiella pneumoniae NOT DETECTED NOT DETECTED   Proteus species NOT DETECTED NOT DETECTED   Salmonella species NOT DETECTED NOT DETECTED   Serratia marcescens NOT DETECTED NOT DETECTED   Haemophilus influenzae NOT DETECTED NOT DETECTED   Neisseria meningitidis NOT DETECTED NOT DETECTED   Pseudomonas  aeruginosa NOT DETECTED NOT DETECTED   Stenotrophomonas maltophilia NOT DETECTED NOT DETECTED   Candida albicans NOT DETECTED NOT DETECTED   Candida auris NOT DETECTED NOT DETECTED   Candida glabrata NOT DETECTED NOT DETECTED   Candida krusei NOT DETECTED NOT DETECTED   Candida parapsilosis NOT DETECTED NOT DETECTED   Candida tropicalis NOT DETECTED NOT DETECTED   Cryptococcus neoformans/gattii NOT DETECTED NOT DETECTED   CTX-M ESBL NOT DETECTED NOT DETECTED   Carbapenem resistance IMP NOT DETECTED NOT DETECTED   Carbapenem resistance KPC NOT DETECTED NOT DETECTED   Carbapenem resistance NDM NOT DETECTED NOT DETECTED   Carbapenem resist OXA 48 LIKE NOT DETECTED NOT DETECTED   Carbapenem resistance VIM NOT DETECTED NOT DETECTED    Cindi Carbon, PharmD 09/11/2023  6:27 PM

## 2023-09-11 NOTE — TOC Initial Note (Signed)
Transition of Care [facility]) - Initial/Assessment Note    Patient Details  Name: Scott Wang MRN: [id number] Date of Birth: [birth year]  [facility] CM/SW Contact:    Lanier Clam, RN Phone Number: 09/11/2023, 4:54 PM  Clinical Narrative:  Spoke to patient/spouse about d/c plans-noted not appropriate for AIR. Their d/c plans are home. Has used OPPT in past may use again if needed.On 02 monitor.Monitor for d/c needs.                 Expected Discharge Plan: Home/Self Care Barriers to Discharge: Continued Medical Work up   Patient Goals and CMS Choice Patient states their goals for this hospitalization and ongoing recovery are:: Home CMS Medicare.gov Compare Post Acute Care list provided to:: Patient Choice offered to / list presented to : Spouse New  ownership interest in [facility].provided to:: Spouse    Expected Discharge Plan and Services   Discharge Planning Services: CM Consult Post Acute Care Choice: Resumption of Svcs/PTA Provider Living arrangements for the past 2 months: Single Family Home                                      Prior Living Arrangements/Services Living arrangements for the past 2 months: Single Family Home Lives with:: Spouse Patient language and need for interpreter reviewed:: Yes Do you feel safe going back to the place where you live?: Yes      Need for Family Participation in Patient Care: Yes (Comment) Care giver support system in place?: Yes (comment) Current home services: DME (rw,rollator) Criminal Activity/Legal Involvement Pertinent to Current Situation/Hospitalization: No - Comment as needed  Activities of Daily Living   ADL Screening (condition at time of admission) Independently performs ADLs?: No Does the patient have a NEW difficulty with bathing/dressing/toileting/self-feeding that is expected to last >3 days?: Yes (Initiates electronic notice to provider for possible OT consult) Does the  patient have a NEW difficulty with getting in/out of bed, walking, or climbing stairs that is expected to last >3 days?: Yes (Initiates electronic notice to provider for possible PT consult) Does the patient have a NEW difficulty with communication that is expected to last >3 days?: No Is the patient deaf or have difficulty hearing?: No Does the patient have difficulty seeing, even when wearing glasses/contacts?: No Does the patient have difficulty concentrating, remembering, or making decisions?: No  Permission Sought/Granted Permission sought to share information with : Case Manager Permission granted to share information with : Yes, Verbal Permission Granted  Share Information with NAME: Case Manager           Emotional Assessment Appearance:: Appears stated age Attitude/Demeanor/Rapport: Gracious Affect (typically observed): Accepting Orientation: : Oriented to Self Alcohol / Substance Use: Not Applicable Psych Involvement: No (comment)  Admission diagnosis:  Weakness [R53.1] AKI (acute kidney injury) (HCC) [N17.9] Patient Active Problem List   Diagnosis Date Noted   AKI (acute kidney injury) (HCC) 09/10/2023   Orthostatic hypotension 11/20/2021   not drinking enough fluids 11/20/2021   Imbalance 11/20/2021   Memory loss 11/20/2021   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 [place] [address] Phone: [phone] Fax: [phone]     Social Determinants of Health (SDOH) Social  History: SDOH Screenings   Food Insecurity: No Food Insecurity (09/11/2023)  Housing: Low Risk  (09/11/2023)  Transportation Needs: No Transportation Needs (09/11/2023)  Utilities: Not At Risk (09/11/2023)  Tobacco Use: Medium Risk (09/04/2023)   SDOH Interventions:     Readmission Risk Interventions     No data to display

## 2023-09-11 NOTE — Evaluation (Signed)
Occupational Therapy Evaluation Patient Details Name: Scott Wang MRN: [id number] DOB: November 12, 1940 Today's Date: 09/11/2023   History of Present Illness 82 y.o. 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  Pt admitted on 09/10/23 for AKI and UTI   Clinical Impression   Pt was seen for OT evaluation this date. Prior to hospital admission, pt was very IND without AD for ADLs and mobility. Pt lives with spouse, who is unable to provide level of physical assist currently required. Pt not on O2 at home, currently on 2L via [place]. Pt overall very lethargic and far from his baseline both cognitively and physically per spouse. Bilat tremors at baseline from Parkinson's.   Pt presents to acute OT demonstrating impaired ADL performance and functional mobility 2/2 decreased cognition, strength, balance and coordination (See OT problem list for additional functional deficits). Pt currently requires max assist for bed mobility, and anticipate max-total for ADLs at bed level, with +2 needed for OOB transfers. Pt requires increased time to follow commands, often minimally responsive to questions, and follows directions with  varying levels of appropriateness. Pt would benefit from skilled OT services to address noted impairments and functional limitations (see below for any additional details) in order to maximize safety and independence while minimizing falls risk and caregiver burden. RN updated on pt's current status. SpO2 95% on 2L, HR 87, BP 128/71 (88). Pt would benefit from post acute rehab >3 hrs a day upon d/c.       If plan is discharge home,  recommend the following: Two people to help with walking and/or transfers;Two people to help with bathing/dressing/bathroom;Direct supervision/assist for medications management;Direct supervision/assist for financial management;Assist for transportation;Help with stairs or ramp for entrance;Supervision due to cognitive status;Assistance with feeding;Assistance with cooking/housework    Functional Status Assessment  Patient has had a recent decline in their functional status and demonstrates the ability to make significant improvements in function in a reasonable and predictable amount of time.  Equipment Recommendations  None recommended by OT (defer to next LOC)    Recommendations for Other Services       Precautions / Restrictions Precautions Precautions: Fall Restrictions Weight Bearing Restrictions: No      Mobility Bed Mobility Overal bed mobility: Needs Assistance Bed Mobility: Rolling Rolling: Max assist, Used rails         General bed mobility comments: Rolling L<>R for repositioning with pt responding minimal comfort despite multiple attempts; returned to supine and placed in chair position    Transfers                   General transfer comment: Unsafe to attempt      Balance Overall balance assessment: History of Falls                                         ADL either performed or assessed with clinical judgement   ADL Overall ADL's : Needs assistance/impaired  General ADL Comments: OOB mobility deferred to pt's lethargy. Pt reports discomfort in L side, requests repositioning. Anticipate pt requiring max-total for ADLs bed level this date.      Pertinent Vitals/Pain Pain Assessment Pain Assessment: 0-10 Pain Score: 5  Pain Location: L side Pain Descriptors / Indicators: Cramping, Discomfort, Grimacing, Guarding Pain Intervention(s): Limited activity within patient's  tolerance, Monitored during session, Repositioned     Extremity/Trunk Assessment Upper Extremity Assessment Upper Extremity Assessment: Generalized weakness;Difficult to assess due to impaired cognition (bilat tremors, baseline tremors from PD but spouse indicates worse)   Lower Extremity Assessment Lower Extremity Assessment: Generalized weakness       Communication Communication Communication: Hearing impairment   Cognition Arousal: Lethargic Behavior During Therapy: Flat affect Overall Cognitive Status: Impaired/Different from baseline Area of Impairment: Following commands                       Following Commands: Follows one step commands inconsistently       General Comments: Spouse indicates pt not himself. Pt with generally lethargic appearance, difficulty following commands despite multimodal cuing, often responding inappropriately- e.g. reaching out to hold OT's arm randomly instead of reaching for bedrail to roll     General Comments  Pt with unwell appearance; responds minimally to questions, lethargic, difficulties following commands, frequently closing eyes. RN and NT alerted. SpO2 95% on 2L, HR 87, BP 128/71 (88)            Home Living Family/patient expects to be discharged to:: Private residence Living Arrangements: Spouse/significant other Available Help at Discharge: Family;Available 24 hours/day Type of Home: House Home Access: Stairs to enter [employer] of Steps: 3 Entrance Stairs-Rails: Right Home Layout: One level     Bathroom Shower/Tub: Producer, television/film/video: Standard Bathroom Accessibility: Yes   Home Equipment: Agricultural consultant (2 wheels);Rollator (4 wheels);Cane - single point;Shower seat          Prior Functioning/Environment Prior Level of Function : Independent/Modified Independent             Mobility Comments: Spouse reports pt IND ADLs Comments: IND, including driving        OT Problem  List: Decreased strength;Decreased range of motion;Decreased activity tolerance;Impaired balance (sitting and/or standing);Decreased safety awareness;Decreased cognition;Impaired UE functional use;Decreased coordination      OT Treatment/Interventions: Self-care/ADL training;Therapeutic exercise;Neuromuscular education;Energy conservation;DME and/or AE instruction;Therapeutic activities;Cognitive remediation/compensation;Patient/family education;Balance training    OT Goals(Current goals can be found in the care plan section) Acute Rehab OT Goals Patient Stated Goal: unable to state. nods in agreement when OT discussus tx plan OT Goal Formulation: Patient unable to participate in goal setting Time For Goal Achievement: 09/25/23 Potential to Achieve Goals: Good  OT Frequency: Min 1X/week       AM-PAC OT "6 Clicks" Daily Activity     Outcome Measure Help from another person eating meals?: A Little Help from another person taking care of personal grooming?: A Lot Help from another person toileting, which includes using toliet, bedpan, or urinal?: Total Help from another person bathing (including washing, rinsing, drying)?: Total Help from another person to put on and taking off regular upper body clothing?: A Lot Help from another person to put on and taking off regular lower body clothing?: Total 6 Click Score: 10   End of Session Nurse Communication: Mobility status;Other (comment) (current status)  Activity Tolerance: Patient limited by lethargy Patient left: in bed;with call bell/phone within reach;with bed alarm set;with family/visitor  present;with nursing/sitter in room  OT Visit Diagnosis: Repeated falls (R29.6);Muscle weakness (generalized) (M62.81);Other abnormalities of gait and mobility (R26.89)                Time: 4540-9811 OT Time Calculation (min): 35 min Charges:  OT General Charges $OT Visit: 1 Visit OT Evaluation $OT Eval Moderate Complexity: 1 Mod  Offie Waide L.  Lera Gaines, OTR/L  09/11/23, 5:37 PM

## 2023-09-11 NOTE — Progress Notes (Signed)
82 y.o. 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   He was found ot be in AKI and UTI.    General exam: Appears calm and comfortable  Respiratory system: Clear to auscultation. Respiratory effort normal. Cardiovascular system: S1 & S2 heard, RRR.  Gastrointestinal system: Abdomen is nondistended, soft and nontender.  Central nervous system: Alert and oriented.  Extremities: Symmetric 5 x 5 power. Skin: No rashes, lesions or ulcers Psychiatry: Mood & affect appropriate.    PLAN:   Continue with IV fluids and IV antibiotics.  Discussed plan with patient and wife at bedside.   Kathlen Mody, MD

## 2023-09-11 NOTE — H&P (Signed)
History and Physical  Scott Wang JYN:[id number] DOB: Jun 21, 1941 DOA: 09/10/2023  Referring physician: Dr. Estell Harpin, EDP  PCP: [name], DO  Outpatient Specialists: Neurology, urology. Patient coming from: Home.  Chief Complaint: Generalized weakness.  HPI: Scott Wang is a 82 y.o. 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  Per his wife at bedside he has been feeling weak for the past several days.  Associated with right flank pain, nausea and vomiting yesterday.  Per his wife he has been complaining of having chills.  No subjective fevers reported.  In the ED, hypotensive with MAP of 60.  Leukocytosis with UA positive for pyuria.  Urine and peripheral blood cultures obtained.  Started on empiric IV antibiotics for urinary tract infection.  Admitted by [facility], hospitalist service.  ED Course: Temperature 97.5.  BP 94/45 with MAP of 60, pulse 75, respiratory 22, saturation 98% on 2 L.  Lab studies notable for WBC 17.9, hemoglobin 11.3, neutrophil count 15.9.  Monocyte count 1.3.  Sodium 133, serum bicarb 21, BUN 38, creatinine 2.93 from baseline of 1.3, GFR 21 from baseline of 49.  Review of Systems: Review of systems as noted in the HPI. All other systems reviewed and are negative.   Past Medical History:  Diagnosis Date   Carpal tunnel syndrome    bilateral, had shots in each one and "haven't had any problems since".   Diabetes mellitus    Hyperlipidemia    Hypertension    Prostate cancer (HCC)    Spinal stenosis    Past Surgical History:  Procedure Laterality Date   HERNIA REPAIR     HERNIA REPAIR     INGUINAL  HERNIA REPAIR     KNEE SURGERY  right   KNEE SURGERY Left    PROSTATE BIOPSY     RADIOACTIVE SEED IMPLANT N/A 09/07/2018   Procedure: RADIOACTIVE SEED IMPLANT/BRACHYTHERAPY IMPLANT;  Surgeon: Jerilee Field, MD;  Location: [facility];  Service: Urology;  Laterality: N/A;   SPACE OAR INSTILLATION N/A 09/07/2018   Procedure: SPACE OAR INSTILLATION;  Surgeon: Jerilee Field, MD;  Location: [facility];  Service: Urology;  Laterality: N/A;    Social History:  reports that he quit smoking about 8 years ago. His smoking use included cigarettes. He has never used smokeless tobacco. He reports that he does not drink alcohol and does not use drugs.   Allergies  Allergen Reactions   Sulfa Antibiotics Swelling and Rash    Family History  Problem Relation Age of Onset   Breast cancer Mother    Heart attack Father    Prostate cancer Neg Hx    Pancreatic cancer Neg Hx    Colon cancer Neg Hx    Tremor Neg Hx    Parkinson's disease Neg Hx       Prior to Admission medications   Medication Sig Start Date End Date Taking? Authorizing Provider  alfuzosin (UROXATRAL) 10 MG 24 hr tablet Take 10 mg by mouth daily with breakfast.   Yes [provider]  atorvastatin (LIPITOR) 10 MG tablet Take 10 mg by mouth at bedtime. 01/03/17  Yes [provider]  carbidopa-levodopa (SINEMET IR) 25-100 MG tablet  TAKE 1 TABLET BY MOUTH TWICE A DAY Patient taking differently: Take 1 tablet by mouth See admin instructions. Take 1 tablet by mouth in the morning, at 2 PM, and between 8-9 PM 07/16/23  Yes Anson Fret, MD  carvedilol (COREG) 3.125 MG tablet Take 3.125 mg by mouth 2 (two) times daily with a meal. 01/21/17  Yes [provider]  Cyanocobalamin (VITAMIN B-12 PO) Take 2,500 mcg by mouth daily.   Yes [provider]  donepezil (ARICEPT) 5 MG tablet TAKE 1 TABLET BY MOUTH EVERYDAY AT BEDTIME Patient taking differently: Take 5 mg by mouth at  bedtime. 11/24/22  Yes Anson Fret, MD  HYDROcodone-acetaminophen (NORCO/VICODIN) 5-325 MG tablet Take 1 tablet by mouth every 6 (six) hours as needed (for pain).   Yes [provider]  losartan (COZAAR) 100 MG tablet Take 100 mg by mouth daily. 11/06/16  Yes [provider]  metFORMIN (GLUCOPHAGE) 500 MG tablet Take 500 mg by mouth 2 (two) times daily with a meal. 11/06/16  Yes [provider]  Multiple Vitamins-Minerals (ONE-A-DAY 50 PLUS PO) Take 1 tablet by mouth daily with breakfast.   Yes [provider]  TYLENOL 500 MG tablet Take 500 mg by mouth 2 (two) times daily as needed for mild pain (pain score 1-3) or headache.   Yes [provider]  Dola Argyle LANCETS FINE MISC  01/01/17   [provider]    Physical Exam: BP (!) 94/45   Pulse 75   Temp (!) 97.5 F (36.4 C) (Oral)   Resp 18   Ht 5\' 7"  (1.702 m)   Wt 75.9 kg   SpO2 98%   BMI 26.21 kg/m   General: 82 y.o. year-old male weak appearing.  Alert and oriented x3. Cardiovascular: Regular rate and rhythm with no rubs or gallops.  No thyromegaly or JVD noted.  No lower extremity edema. 2/4 pulses in all 4 extremities. Respiratory: Clear to auscultation with no wheezes or rales. Good inspiratory effort. Abdomen: Soft nontender nondistended with normal bowel sounds x4 quadrants. Muskuloskeletal: No cyanosis, clubbing or edema noted bilaterally Neuro: CN II-XII intact, strength, sensation, reflexes Skin: No ulcerative lesions noted or rashes Psychiatry: Judgement and insight appear normal. Mood is appropriate for condition and setting          Labs on Admission:  Basic Metabolic Panel: Recent Labs  Lab 09/10/23 1947  NA 133*  K 4.4  CL 104  CO2 21*  GLUCOSE 100*  BUN 38*  CREATININE 2.93*  CALCIUM 9.3   Liver Function Tests: Recent Labs  Lab 09/10/23 1947  AST 26  ALT 19  ALKPHOS 34*  BILITOT 1.2  PROT 5.7*  ALBUMIN 3.0*   No results for  input(s): "LIPASE", "AMYLASE" in the last 168 hours. No results for input(s): "AMMONIA" in the last 168 hours. CBC: Recent Labs  Lab 09/10/23 1947  WBC 17.9*  NEUTROABS 15.9*  HGB 11.3*  HCT 34.2*  MCV 94.5  PLT 147*   Cardiac Enzymes: No results for input(s): "CKTOTAL", "CKMB", "CKMBINDEX", "TROPONINI" in the last 168 hours.  BNP (last 3 results) No results for input(s): "BNP" in the last 8760 hours.  ProBNP (last 3 results) No results for input(s): "PROBNP" in the last 8760 hours.  CBG: No results for input(s): "GLUCAP" in the last 168 hours.  Radiological Exams on Admission: DG Chest Port 1 View  Result Date: 09/10/2023 CLINICAL DATA:  Weakness. EXAM: PORTABLE CHEST 1 VIEW COMPARISON:  September 10, 2021  FINDINGS: The heart size and mediastinal contours are within normal limits. Mild atelectasis is seen within the bilateral lung bases. There is no evidence of a pleural effusion or pneumothorax. The visualized skeletal structures are unremarkable. IMPRESSION: Mild bibasilar atelectasis. Electronically Signed   By: Aram Candela M.D.   On: 09/10/2023 22:11    EKG: I independently viewed the EKG done and my findings are as followed: None available at the time of this visit.  Assessment/Plan Present on Admission:  AKI (acute kidney injury) (HCC)  Principal Problem:   AKI (acute kidney injury) (HCC)  AKI on CKD 3B, suspect prerenal in the setting of poor oral intake, nausea and vomiting Baseline creatinine appears to be 1.3 with GFR of 49.  Presented with creatinine of 2.93 with GFR of 21. Continue IV fluid hydration NS at 75 cc/h x 1 day As needed antiemetics Monitor urine output. Repeat renal function panel in the morning. Avoid nephrotoxic agents, dehydration, and hypotension.  Urinary tract infection, POA Right flank pain with concern for pyelonephritis In the setting of right renal mass Obtain renal ultrasound Follows with urology outpatient Monitor urine  culture and peripheral blood cultures x 2 for ID and sensitivities Monitor fever curve and WBCs As needed analgesics  History of hypertension Currently hypotensive with MAP of 60 Hold off home oral antihypertensives Received a dose of midodrine 10 mg x 1 and NS bolus 1 L x 1 Continue IV fluid and midodrine 10 mg 3 times daily x 3 doses Maintain MAP greater than 65. Closely monitor vital signs  Parkinson's disease Resume home regimen  Hyperlipidemia Resume home regimen  BPH Resume home regimen  Generalized weakness with fall PT OT assessment Fall precautions.   Critical care time: 65 minutes.   DVT prophylaxis: Subcu Lovenox daily.  Code Status: Full code.  Family Communication: Updated the patient's wife and 2 sons at bedside.  Disposition Plan: Admitted to progressive care unit.  Consults called: None.  Admission status: Inpatient status.   Status is: Inpatient The patient requires at least 2 midnights for further evaluation and treatment of present condition.   Darlin Drop MD [facility] Pager [phone]  If 7PM-7AM, please contact night-coverage [email or web address] Password [facility]  09/11/2023, 4:29 AM

## 2023-09-11 NOTE — Progress Notes (Signed)
Inpatient Rehab Admissions Coordinator:   Per PT recommendations pt was screened for CIR by Estill Dooms, PT, DPT.  With current payor trends, Francine Graven will not approve CIR for diagnosis of UTI/AKI debility.  Will need to look into other rehab venues.    Estill Dooms, PT, DPT Admissions Coordinator [phone] 09/11/23  1:17 PM

## 2023-09-12 ENCOUNTER — Encounter (HOSPITAL_COMMUNITY): Payer: Self-pay | Admitting: Internal Medicine

## 2023-09-12 DIAGNOSIS — F0390 Unspecified dementia without behavioral disturbance: Secondary | ICD-10-CM | POA: Diagnosis not present

## 2023-09-12 DIAGNOSIS — G20B1 Parkinson's disease with dyskinesia, without mention of fluctuations: Secondary | ICD-10-CM

## 2023-09-12 DIAGNOSIS — E785 Hyperlipidemia, unspecified: Secondary | ICD-10-CM

## 2023-09-12 DIAGNOSIS — I959 Hypotension, unspecified: Secondary | ICD-10-CM

## 2023-09-12 DIAGNOSIS — N179 Acute kidney failure, unspecified: Secondary | ICD-10-CM | POA: Diagnosis not present

## 2023-09-12 LAB — RENAL FUNCTION PANEL
Albumin: 2.6 g/dL — ABNORMAL LOW (ref 3.5–5.0)
Anion gap: 10 (ref 5–15)
BUN: 52 mg/dL — ABNORMAL HIGH (ref 8–23)
CO2: 18 mmol/L — ABNORMAL LOW (ref 22–32)
Calcium: 9 mg/dL (ref 8.9–10.3)
Chloride: 101 mmol/L (ref 98–111)
Creatinine, Ser: 2.85 mg/dL — ABNORMAL HIGH (ref 0.61–1.24)
GFR, Estimated: 22 mL/min — ABNORMAL LOW (ref >60)
Glucose, Bld: 102 mg/dL — ABNORMAL HIGH (ref 70–99)
Phosphorus: 3.3 mg/dL (ref 2.5–4.6)
Potassium: 4.3 mmol/L (ref 3.5–5.1)
Sodium: 129 mmol/L — ABNORMAL LOW (ref 135–145)

## 2023-09-12 LAB — SODIUM, URINE, RANDOM: Sodium, Ur: 62 mmol/L

## 2023-09-12 MED ORDER — SODIUM CHLORIDE 0.9 % IV SOLN: INTRAVENOUS | Status: DC

## 2023-09-12 NOTE — Plan of Care (Signed)
  Problem: Safety: Goal: Ability to remain free from injury will improve Outcome: Progressing   Problem: Skin Integrity: Goal: Risk for impaired skin integrity will decrease Outcome: Progressing   Problem: Pain Management: Goal: General experience of comfort will improve Outcome: Progressing   Problem: Coping: Goal: Level of anxiety will decrease Outcome: Progressing   Problem: Nutrition: Goal: Adequate nutrition will be maintained Outcome: Progressing   Problem: Activity: Goal: Risk for activity intolerance will decrease Outcome: Progressing   Problem: Education: Goal: Knowledge of General Education information will improve Description: Including pain rating scale, medication(s)/side effects and non-pharmacologic comfort measures Outcome: Progressing   Problem: Clinical Measurements: Goal: Ability to maintain clinical measurements within normal limits will improve Outcome: Progressing

## 2023-09-12 NOTE — Progress Notes (Signed)
Assumed care of patient. Agree with previous nursing assessment. Patient in bed resting comfortably.

## 2023-09-12 NOTE — Progress Notes (Signed)
[facility]                                                                               Scott Wang, is a 82 y.o. male, DOB - 22-Apr-1941, DGU:[id number] Admit date - 09/10/2023    Outpatient Primary MD for the patient is [name], DO  LOS - 2  days    Brief summary    82 y.o. male with medical history significant for Parkinson's disease, hypertension, hyperlipidemia, right-sided renal mass extending into the renal sinus without extension outside of the renal capsule or involvement of the renal vein (findings worrisome for renal cell carcinoma, seen on MRI 07/02/2023-followed by urology), who presented from home due to generalized weakness and a fall due to loss of balance .  Patient was hypotensive, leukocytosis . He was found to be in AKI and UTI.  Assessment & Plan    Assessment and Plan:   Urinary tract infection present on admission Right flank pain with concern for pyelonephritis In the setting of right renal mass  Renal ultrasound reviewed with the patient and his wife. As per the wife they just saw Dr Mena Goes on Wednesday and plan was to watch the progression.  Urine cultures show klebsiella, sensitivities are pending.    Klebsiella bacteremia  Continue with IV ceftriaxone.  Repeat blood cultures in am.  Follow wbc count.    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  Non anion gap metabolic acidosis.  Baseline creatinine at 1.3, creatinine on admission around 3, improved to 2.85 with IV fluids.  Bicarb still at 18.  Continue to monitor.    Hypertension; BP parameters have improved.  Holding home coreg and losartan.     Parkinson's disease;  Resume home regimen.    Hyperlipidemia:  Resume lipitor 10 mg daily.    Dementia:  Continue with Aricept 5 mg daily.    Hyponatremia:  Sodium of 134 to 129.  ? Dehydration, rule out SIADH.  Check urine sodium, serum osmolality, TSH , am cortisol.  Gently hydrate  and repeat serum sodium in am.     Estimated body mass index is 26.21 kg/m as calculated from the following:   Height as of this encounter: 5\' 7"  (1.702 m).   Weight as of this encounter: 75.9 kg.  Code Status: full code.  DVT Prophylaxis:  enoxaparin (LOVENOX) injection 30 mg Start: 09/11/23 1000   Level of Care: Level of care: Progressive Family Communication: not at bedside.   Disposition Plan:     Remains inpatient appropriate:  IV antibiotics.   Procedures:  None.   Consultants:   Palliative care   Antimicrobials:   Anti-infectives (From admission, onward)    Start     Dose/Rate Route Frequency Ordered Stop   09/11/23 0145  cefTRIAXone (ROCEPHIN) 2 g in sodium chloride 0.9 % 100 mL IVPB        2 g 200 mL/hr over 30 Minutes Intravenous Every 24 hours 09/11/23 0058          Medications  Scheduled Meds:  alfuzosin  10 mg Oral Q breakfast   atorvastatin  10 mg Oral QHS  carbidopa-levodopa  1 tablet Oral TID   vitamin B-12  2,500 mcg Oral Daily   donepezil  5 mg Oral QHS   enoxaparin (LOVENOX) injection  30 mg Subcutaneous Q24H   Continuous Infusions:  sodium chloride 75 mL/hr at 09/12/23 1648   cefTRIAXone (ROCEPHIN)  IV Stopped (09/11/23 2141)   PRN Meds:.acetaminophen, melatonin, oxyCODONE, polyethylene glycol, prochlorperazine    Subjective:   Scott Wang was seen and examined today.  Slightly confused, but not in any pain.  He is oriented to person only.   Objective:   Vitals:   09/11/23 1700 09/11/23 2011 09/12/23 0436 09/12/23 1510  BP:  (!) 165/95 (!) 129/59 (!) 110/58  Pulse: 86 (!) 108 76 78  Resp:  20 16 14   Temp: 98.2 F (36.8 C) 98.2 F (36.8 C) 98.4 F (36.9 C) 97.9 F (36.6 C)  TempSrc: Oral Oral Oral Oral  SpO2: 96% 97% 96% 97%  Weight:      Height:        Intake/Output Summary (Last 24 hours) at 09/12/2023 1749 Last data filed at 09/12/2023 1744 Gross per 24 hour  Intake 250 ml  Output 1400 ml  Net -1150 ml   Filed  Weights   09/11/23 0055  Weight: 75.9 kg     Exam General exam: ill appearing gentleman, restless and confused.  Respiratory system: Clear to auscultation. Respiratory effort normal. Cardiovascular system: S1 & S2 heard, RRR. No JVD,  Gastrointestinal system: Abdomen is nondistended, soft and nontender.  Central nervous system: Alert and confused, not following commands.  Extremities: no edema.  Skin: No rashes,     Data Reviewed:  I have personally reviewed following labs and imaging studies   CBC Lab Results  Component Value Date   WBC 15.3 (H) 09/11/2023   RBC 3.69 (L) 09/11/2023   HGB 11.4 (L) 09/11/2023   HCT 35.5 (L) 09/11/2023   MCV 96.2 09/11/2023   MCH 30.9 09/11/2023   PLT 147 (L) 09/11/2023   MCHC 32.1 09/11/2023   RDW 14.3 09/11/2023   LYMPHSABS 0.4 (L) 09/10/2023   MONOABS 1.3 (H) 09/10/2023   EOSABS 0.0 09/10/2023   BASOSABS 0.0 09/10/2023     Last metabolic panel Lab Results  Component Value Date   NA 129 (L) 09/12/2023   K 4.3 09/12/2023   CL 101 09/12/2023   CO2 18 (L) 09/12/2023   BUN 52 (H) 09/12/2023   CREATININE 2.85 (H) 09/12/2023   GLUCOSE 102 (H) 09/12/2023   GFRNONAA 22 (L) 09/12/2023   GFRAA 57 (L) 09/17/2020   CALCIUM 9.0 09/12/2023   PHOS 3.3 09/12/2023   PROT 5.7 (L) 09/10/2023   ALBUMIN 2.6 (L) 09/12/2023   LABGLOB 2.4 09/17/2020   AGRATIO 1.7 09/17/2020   BILITOT 1.2 09/10/2023   ALKPHOS 34 (L) 09/10/2023   AST 26 09/10/2023   ALT 19 09/10/2023   ANIONGAP 10 09/12/2023    CBG (last 3)  No results for input(s): "GLUCAP" in the last 72 hours.    Coagulation Profile: No results for input(s): "INR", "PROTIME" in the last 168 hours.   Radiology Studies: US RENAL  Result Date: 09/11/2023 CLINICAL DATA:  Acute kidney injury EXAM: RENAL / URINARY TRACT ULTRASOUND COMPLETE COMPARISON:  Abdominal MRI 07/02/2023 FINDINGS: Right Kidney: Renal measurements: 9.6 x 5.6 x 4.0 cm = volume: 111 mL. Echogenicity within normal  limits. No hydronephrosis. Stealth the right renal lesion at the lower pole which is essentially isoechoic to cortex. Left Kidney: Renal measurements: 9.5 x 5.9  x 4.7 cm = volume: 140 mL. Echogenicity within normal limits. No mass or hydronephrosis visualized. Bladder: Appears normal for degree of bladder distention. IMPRESSION: 1. No acute finding.  No hydronephrosis. 2. Right renal mass much better seen on abdominal MRI 07/02/2023. Electronically Signed   By: Tiburcio Pea M.D.   On: 09/11/2023 07:36   DG Chest Port 1 View  Result Date: 09/10/2023 CLINICAL DATA:  Weakness. EXAM: PORTABLE CHEST 1 VIEW COMPARISON:  September 10, 2021 FINDINGS: The heart size and mediastinal contours are within normal limits. Mild atelectasis is seen within the bilateral lung bases. There is no evidence of a pleural effusion or pneumothorax. The visualized skeletal structures are unremarkable. IMPRESSION: Mild bibasilar atelectasis. Electronically Signed   By: Aram Candela M.D.   On: 09/10/2023 22:11       Kathlen Mody M.D. [facility] 09/12/2023, 5:49 PM  Available via Epic secure chat 7am-7pm After 7 pm, please refer to night coverage provider listed on amion.

## 2023-09-12 NOTE — Consult Note (Signed)
Consultation Note Date: 09/12/2023   Patient Name: Scott Wang  DOB: Jan 21, 1941  MRN: [id number]  Age / Sex: 82 y.o., male  PCP: [name], DO Referring Physician: Kathlen Mody, MD  Reason for Consultation: Establishing goals of care  HPI/Patient Profile: 82 y.o. male   admitted on 09/10/2023   Clinical Assessment and Goals of Care: 82 year old gentleman lives at home with wife, history of Parkinson's disease hypertension dyslipidemia, history of right-sided renal mass extending into renal sinus, findings worrisome for renal cell carcinoma found out on MRI in August 2024, follows with urology. Patient presented from home due to generalized weakness and fall, currently admitted to hospital medicine for acute kidney injury and urinary tract infection.  Currently placed on IV fluids and IV antibiotics. Palliative consult for ongoing goals of care discussions has been requested. Chart reviewed, patient seen, discussed with family present at bedside including wife son and daughter-in-law.  Gave a brief overview of differences between hospice and palliative services.  Discussed about serious nature of patient's illness and why palliative was consulted. Palliative medicine is specialized medical care for people living with serious illness. It focuses on providing relief from the symptoms and stress of a serious illness. The goal is to improve quality of life for both the patient and the family. Goals of care: Broad aims of medical therapy in relation to the patient's values and preferences. Our aim is to provide medical care aimed at enabling patients to achieve the goals that matter most to them, given the circumstances of their particular medical situation and their constraints.    NEXT OF KIN Lives at home with wife in [place], [place].  Also has son and daughter-in-law.  SUMMARY OF  RECOMMENDATIONS   Full code for now.  Patient's wife states that he has prepared advance care planning documents in the past-he has appropriate living will and healthcare power of attorney documents, son daughter-in-law and wife will review these documents.  Full code for now. Continue current mode of care. Likely for home with home health towards the end of this hospitalization. Patient and family hopeful for stabilization/recovery. Code Status/Advance Care Planning: Full code   Symptom Management:     Palliative Prophylaxis:  Delirium Protocol  Additional Recommendations (Limitations, Scope, Preferences): Full Scope Treatment  Psycho-social/Spiritual:  Desire for further Chaplaincy support:yes Additional Recommendations: Caregiving  Support/Resources  Prognosis:  Unable to determine  Discharge Planning: Home with Home Health      Primary Diagnoses: Present on Admission:  AKI (acute kidney injury) (HCC)   I have reviewed the medical record, interviewed the patient and family, and examined the patient. The following aspects are pertinent.  Past Medical History:  Diagnosis Date   Carpal tunnel syndrome    bilateral, had shots in each one and "haven't had any problems since".   Diabetes mellitus    Hyperlipidemia    Hypertension    Prostate cancer [facility])    Spinal stenosis    Social History   Socioeconomic History   Marital status:  Married    Spouse name: Not on file   Number of children: 2   Years of education: Not on file   Highest education level: Some college, no degree  Occupational History   Occupation: retired  Tobacco Use   Smoking status: Former    Current packs/day: 0.00    Types: Cigarettes    Quit date: 2016    Years since quitting: 8.8   Smokeless tobacco: Never  Vaping Use   Vaping status: Never Used  Substance and Sexual Activity   Alcohol use: Never   Drug use: Never   Sexual activity: Not Currently    Comment: 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  Medications have been unsuccessful.  Other Topics Concern   Not on file  Social History Narrative   Resides in [place] with wife. Has two grown sons and three grandchildren.       Lives at home with wife   Right handed   Caffeine: 2 cups coffee/day   Social Determinants of Health   Financial Resource Strain: Not on file  Food Insecurity: No Food Insecurity (09/11/2023)   Hunger Vital Sign    Worried About Running Out of Food in the Last Year: Never true    Ran Out of Food in the Last Year: Never true  Transportation Needs: No Transportation Needs (09/11/2023)   PRAPARE - Administrator, Civil Service (Medical): No    Lack of Transportation (Non-Medical): No  Physical Activity: Not on file  Stress: Not on file  Social Connections: Not on file   Family History  Problem Relation Age of Onset   Breast cancer Mother    Heart attack Father    Prostate cancer Neg Hx    Pancreatic cancer Neg Hx    Colon cancer Neg Hx    Tremor Neg Hx    Parkinson's disease Neg Hx    Scheduled Meds:  alfuzosin  10 mg Oral Q breakfast   atorvastatin  10 mg Oral QHS   carbidopa-levodopa  1 tablet Oral TID   vitamin B-12  2,500 mcg Oral Daily   donepezil  5 mg Oral QHS   enoxaparin (LOVENOX) injection  30 mg Subcutaneous Q24H   Continuous Infusions:  sodium chloride 75 mL/hr at 09/12/23 1324   cefTRIAXone (ROCEPHIN)  IV 2 g (09/11/23 2111)   PRN Meds:.acetaminophen, melatonin, oxyCODONE, polyethylene glycol, prochlorperazine Medications Prior to Admission:  Prior to Admission medications   Medication Sig Start Date End Date Taking? Authorizing Provider  alfuzosin (UROXATRAL) 10 MG 24 hr tablet Take 10 mg by mouth daily with breakfast.   Yes [provider]  atorvastatin (LIPITOR) 10 MG tablet Take 10 mg by mouth at bedtime. 01/03/17  Yes [provider]  carbidopa-levodopa (SINEMET IR) 25-100 MG tablet TAKE 1 TABLET BY MOUTH TWICE A DAY Patient taking  differently: Take 1 tablet by mouth 3 (three) times daily. Take 1 tablet by mouth in the morning, at 2 PM, and between 8-9 PM 07/16/23  Yes Anson Fret, MD  carvedilol (COREG) 3.125 MG tablet Take 3.125 mg by mouth 2 (two) times daily with a meal. 01/21/17  Yes [provider]  Cyanocobalamin (VITAMIN B-12 PO) Take 2,500 mcg by mouth daily.   Yes [provider]  donepezil (ARICEPT) 5 MG tablet TAKE 1 TABLET BY MOUTH EVERYDAY AT BEDTIME Patient taking differently: Take 5 mg by mouth at bedtime. 11/24/22  Yes Anson Fret, MD  HYDROcodone-acetaminophen (NORCO/VICODIN) 5-325 MG tablet Take 1 tablet  by mouth every 6 (six) hours as needed (for pain).   Yes [provider]  losartan (COZAAR) 100 MG tablet Take 100 mg by mouth daily. 11/06/16  Yes [provider]  metFORMIN (GLUCOPHAGE) 500 MG tablet Take 500 mg by mouth 2 (two) times daily with a meal. 11/06/16  Yes [provider]  Multiple Vitamins-Minerals (ONE-A-DAY 50 PLUS PO) Take 1 tablet by mouth daily with breakfast.   Yes [provider]  TYLENOL 500 MG tablet Take 500 mg by mouth 2 (two) times daily as needed for mild pain (pain score 1-3) or headache.   Yes [provider]  Dola Argyle LANCETS FINE MISC  01/01/17   [provider]   Allergies  Allergen Reactions   Sulfa Antibiotics Swelling and Rash   Review of Systems +weak  Physical Exam Not agitated currently Sitting in a chair appears comfortable Regular work of breathing In no distress  Vital Signs: BP (!) 129/59 (BP Location: Right Arm)   Pulse 76   Temp 98.4 F (36.9 C) (Oral)   Resp 16   Ht 5\' 7"  (1.702 m)   Wt 75.9 kg   SpO2 96%   BMI 26.21 kg/m  Pain Scale: 0-10   Pain Score: 0-No pain   SpO2: SpO2: 96 % O2 Device:SpO2: 96 % O2 Flow Rate: .O2 Flow Rate (L/min): 2 L/min  IO: Intake/output summary:  Intake/Output Summary (Last 24 hours) at 09/12/2023 1409 Last data filed at  09/12/2023 4166 Gross per 24 hour  Intake --  Output 800 ml  Net -800 ml    LBM: Last BM Date : 09/11/23 Baseline Weight: Weight: 75.9 kg Most recent weight: Weight: 75.9 kg     Palliative Assessment/Data:   PPS 40%  Time In:  1300 Time Out:  1400 Time Total:  60  Greater than 50%  of this time was spent counseling and coordinating care related to the above assessment and plan.  Signed by: Rosalin Hawking, MD   Please contact Palliative Medicine Team phone at [phone] for questions and concerns.  For individual provider: See Loretha Stapler

## 2023-09-12 NOTE — Plan of Care (Signed)
  Problem: Education: Goal: Knowledge of General Education information will improve Description: Including pain rating scale, medication(s)/side effects and non-pharmacologic comfort measures Outcome: Progressing   Problem: Clinical Measurements: Goal: Ability to maintain clinical measurements within normal limits will improve Outcome: Progressing Goal: Will remain free from infection Outcome: Progressing Goal: Diagnostic test results will improve Outcome: Progressing Goal: Respiratory complications will improve Outcome: Progressing Goal: Cardiovascular complication will be avoided Outcome: Progressing   Problem: Nutrition: Goal: Adequate nutrition will be maintained Outcome: Progressing   Problem: Elimination: Goal: Will not experience complications related to bowel motility Outcome: Progressing Goal: Will not experience complications related to urinary retention Outcome: Progressing   

## 2023-09-12 NOTE — Plan of Care (Signed)

## 2023-09-13 DIAGNOSIS — I959 Hypotension, unspecified: Secondary | ICD-10-CM | POA: Diagnosis not present

## 2023-09-13 DIAGNOSIS — E785 Hyperlipidemia, unspecified: Secondary | ICD-10-CM | POA: Diagnosis not present

## 2023-09-13 DIAGNOSIS — N179 Acute kidney failure, unspecified: Secondary | ICD-10-CM | POA: Diagnosis not present

## 2023-09-13 DIAGNOSIS — F0390 Unspecified dementia without behavioral disturbance: Secondary | ICD-10-CM | POA: Diagnosis not present

## 2023-09-13 LAB — BASIC METABOLIC PANEL
Anion gap: 8 (ref 5–15)
BUN: 58 mg/dL — ABNORMAL HIGH (ref 8–23)
CO2: 18 mmol/L — ABNORMAL LOW (ref 22–32)
Calcium: 8.8 mg/dL — ABNORMAL LOW (ref 8.9–10.3)
Chloride: 104 mmol/L (ref 98–111)
Creatinine, Ser: 2.48 mg/dL — ABNORMAL HIGH (ref 0.61–1.24)
GFR, Estimated: 25 mL/min — ABNORMAL LOW (ref >60)
Glucose, Bld: 113 mg/dL — ABNORMAL HIGH (ref 70–99)
Potassium: 3.9 mmol/L (ref 3.5–5.1)
Sodium: 130 mmol/L — ABNORMAL LOW (ref 135–145)

## 2023-09-13 LAB — URINE CULTURE: Culture: >=100000 — AB

## 2023-09-13 LAB — PROCALCITONIN: Procalcitonin: 3.97 ng/mL

## 2023-09-13 LAB — TSH: TSH: 1.667 u[IU]/mL (ref 0.350–4.500)

## 2023-09-13 LAB — LACTIC ACID, PLASMA: Lactic Acid, Venous: 1.4 mmol/L (ref 0.5–1.9)

## 2023-09-13 LAB — CBC WITH DIFFERENTIAL/PLATELET
Abs Immature Granulocytes: 0.09 10*3/uL — ABNORMAL HIGH (ref 0.00–0.07)
Basophils Absolute: 0 10*3/uL (ref 0.0–0.1)
Basophils Relative: 0 %
Eosinophils Absolute: 0 10*3/uL (ref 0.0–0.5)
Eosinophils Relative: 0 %
HCT: 31.6 % — ABNORMAL LOW (ref 39.0–52.0)
Hemoglobin: 10.7 g/dL — ABNORMAL LOW (ref 13.0–17.0)
Immature Granulocytes: 1 %
Lymphocytes Relative: 6 %
Lymphs Abs: 0.7 10*3/uL (ref 0.7–4.0)
MCH: 31 pg (ref 26.0–34.0)
MCHC: 33.9 g/dL (ref 30.0–36.0)
MCV: 91.6 fL (ref 80.0–100.0)
Monocytes Absolute: 1 10*3/uL (ref 0.1–1.0)
Monocytes Relative: 8 %
Neutro Abs: 10.1 10*3/uL — ABNORMAL HIGH (ref 1.7–7.7)
Neutrophils Relative %: 85 %
Platelets: 162 10*3/uL (ref 150–400)
RBC: 3.45 MIL/uL — ABNORMAL LOW (ref 4.22–5.81)
RDW: 14.5 % (ref 11.5–15.5)
WBC: 11.9 10*3/uL — ABNORMAL HIGH (ref 4.0–10.5)
nRBC: 0 % (ref 0.0–0.2)

## 2023-09-13 LAB — OSMOLALITY: Osmolality: 288 mosm/kg (ref 275–295)

## 2023-09-13 LAB — CORTISOL: Cortisol, Plasma: 24.5 ug/dL

## 2023-09-13 NOTE — Progress Notes (Signed)
[facility]                                                                               Scott Wang, is a 82 y.o. male, DOB - [birth year], ZOX:[id number] Admit date - 09/10/2023    Outpatient Primary MD for the patient is [name], DO  LOS - 3  days    Brief summary    82 y.o. male with medical history significant for Parkinson's disease, hypertension, hyperlipidemia, right-sided renal mass extending into the renal sinus without extension outside of the renal capsule or involvement of the renal vein (findings worrisome for renal cell carcinoma, seen on MRI 07/02/2023-followed by urology), who presented from home due to generalized weakness and a fall due to loss of balance .  Patient was hypotensive, with leukocytosis . He was found to be in AKI and UTI.  Assessment & Plan    Assessment and Plan:   Urinary tract infection present on admission Right flank pain with concern for pyelonephritis In the setting of right renal mass  Renal ultrasound reviewed with the patient and his wife. As per the wife they just saw Dr Mena Goes on Wednesday and plan was to watch the progression of the renal mass.  Urine cultures show klebsiella, sensitivities are pending.  Meanwhile continue with IV ceftriaxone.   Klebsiella bacteremia  Continue with IV ceftriaxone.  Repeat blood cultures pending.  Improving leukocytosis. 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  Non anion gap metabolic acidosis.  Baseline creatinine at 1.3, creatinine on admission around 3, improved to 2.85 to 2.48 with IV fluids.  Bicarb still at 18. Will add sodium bicarb tablets.  Continue to monitor.    Hypertension; well controlled BP parameters.     Parkinson's disease;  Resume carbidopa.   Hyperlipidemia:  Resume lipitor 10 mg daily.    Dementia:  Continue with Aricept 5 mg daily.    Hyponatremia:  Sodium of 134 to  129 to 130. Osmolality is 288, TSH wnl.  URINE sodium is 62    Estimated body mass index is 26.21 kg/m as calculated from the following:   Height as of this encounter: 5\' 7"  (1.702 m).   Weight as of this encounter: 75.9 kg.  Code Status: full code.  DVT Prophylaxis:  enoxaparin (LOVENOX) injection 30 mg Start: 09/11/23 1000   Level of Care: Level of care: Progressive Family Communication: not at bedside.   Disposition Plan:     Remains inpatient appropriate:  IV antibiotics.   Procedures:  None.   Consultants:   Palliative care   Antimicrobials:   Anti-infectives (From admission, onward)    Start     Dose/Rate Route Frequency Ordered Stop   09/11/23 0145  cefTRIAXone (ROCEPHIN) 2 g in sodium chloride 0.9 % 100 mL IVPB        2 g 200 mL/hr over 30 Minutes Intravenous Every 24 hours 09/11/23 0058          Medications  Scheduled Meds:  alfuzosin  10 mg Oral Q breakfast   atorvastatin  10 mg Oral QHS  carbidopa-levodopa  1 tablet Oral TID   vitamin B-12  2,500 mcg Oral Daily   donepezil  5 mg Oral QHS   enoxaparin (LOVENOX) injection  30 mg Subcutaneous Q24H   Continuous Infusions:  cefTRIAXone (ROCEPHIN)  IV 2 g (09/12/23 2103)   PRN Meds:.acetaminophen, melatonin, oxyCODONE, polyethylene glycol, prochlorperazine    Subjective:   Scott Wang was seen and examined today. He ism ore alert and responding to questions .   Objective:   Vitals:   09/12/23 1510 09/12/23 1959 09/13/23 0536 09/13/23 1314  BP: (!) 110/58 139/77 133/71 136/64  Pulse: 78 87 74 73  Resp: 14 16 16 20   Temp: 97.9 F (36.6 C) 99.5 F (37.5 C) 99 F (37.2 C) 98 F (36.7 C)  TempSrc: Oral Oral Oral Oral  SpO2: 97% 99% 97% 97%  Weight:      Height:        Intake/Output Summary (Last 24 hours) at 09/13/2023 1839 Last data filed at 09/13/2023 1500 Gross per 24 hour  Intake 560.82 ml  Output 1650 ml  Net -1089.18 ml   Filed Weights   09/11/23 0055  Weight: 75.9 kg      Exam General exam: elderly gentleman, not in distress.  Respiratory system: Clear to auscultation. Respiratory effort normal. Cardiovascular system: S1 & S2 heard, RRR.  Gastrointestinal system: Abdomen is nondistended, soft and nontender.  Central nervous system: Alert and oriented. No focal neurological deficits. Extremities: no edema.  Skin: No rashes,  Psychiatry:  Mood & affect appropriate.      Data Reviewed:  I have personally reviewed following labs and imaging studies   CBC Lab Results  Component Value Date   WBC 11.9 (H) 09/13/2023   RBC 3.45 (L) 09/13/2023   HGB 10.7 (L) 09/13/2023   HCT 31.6 (L) 09/13/2023   MCV 91.6 09/13/2023   MCH 31.0 09/13/2023   PLT 162 09/13/2023   MCHC 33.9 09/13/2023   RDW 14.5 09/13/2023   LYMPHSABS 0.7 09/13/2023   MONOABS 1.0 09/13/2023   EOSABS 0.0 09/13/2023   BASOSABS 0.0 09/13/2023     Last metabolic panel Lab Results  Component Value Date   NA 130 (L) 09/13/2023   K 3.9 09/13/2023   CL 104 09/13/2023   CO2 18 (L) 09/13/2023   BUN 58 (H) 09/13/2023   CREATININE 2.48 (H) 09/13/2023   GLUCOSE 113 (H) 09/13/2023   GFRNONAA 25 (L) 09/13/2023   GFRAA 57 (L) 09/17/2020   CALCIUM 8.8 (L) 09/13/2023   PHOS 3.3 09/12/2023   PROT 5.7 (L) 09/10/2023   ALBUMIN 2.6 (L) 09/12/2023   LABGLOB 2.4 09/17/2020   AGRATIO 1.7 09/17/2020   BILITOT 1.2 09/10/2023   ALKPHOS 34 (L) 09/10/2023   AST 26 09/10/2023   ALT 19 09/10/2023   ANIONGAP 8 09/13/2023    CBG (last 3)  No results for input(s): "GLUCAP" in the last 72 hours.    Coagulation Profile: No results for input(s): "INR", "PROTIME" in the last 168 hours.   Radiology Studies: No results found.     Kathlen Mody M.D. [facility] 09/13/2023, 6:39 PM  Available via Epic secure chat 7am-7pm After 7 pm, please refer to night coverage provider listed on amion.

## 2023-09-13 NOTE — Plan of Care (Signed)
  Problem: Education: Goal: Knowledge of General Education information will improve Description: Including pain rating scale, medication(s)/side effects and non-pharmacologic comfort measures Outcome: Progressing   Problem: Health Behavior/Discharge Planning: Goal: Ability to manage health-related needs will improve Outcome: Progressing   Problem: Clinical Measurements: Goal: Ability to maintain clinical measurements within normal limits will improve Outcome: Progressing Goal: Will remain free from infection Outcome: Progressing Goal: Diagnostic test results will improve Outcome: Progressing   Problem: Activity: Goal: Risk for activity intolerance will decrease Outcome: Progressing   Problem: Nutrition: Goal: Adequate nutrition will be maintained Outcome: Progressing   Problem: Safety: Goal: Ability to remain free from injury will improve Outcome: Progressing   Problem: Clinical Measurements: Goal: Respiratory complications will improve Outcome: Adequate for Discharge   Problem: Coping: Goal: Level of anxiety will decrease Outcome: Adequate for Discharge   Problem: Elimination: Goal: Will not experience complications related to bowel motility Outcome: Adequate for Discharge Goal: Will not experience complications related to urinary retention Outcome: Adequate for Discharge   Problem: Pain Management: Goal: General experience of comfort will improve Outcome: Adequate for Discharge   Problem: Skin Integrity: Goal: Risk for impaired skin integrity will decrease Outcome: Adequate for Discharge

## 2023-09-13 NOTE — Progress Notes (Signed)
Physical Therapy Treatment Patient Details Name: Scott Wang MRN: [id number] DOB: Jul 16, 1941 Today's Date: 09/13/2023   History of Present Illness 82 y.o. 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  Pt admitted on 09/10/23 for AKI and UTI    PT Comments  Pt agreeable to mobilize however only able to stand today.  Pt requiring max-total assist +2 for bed mobility and standing at this time.  Pt unable to shift weight to take steps.  Patient will benefit from continued inpatient follow up therapy, <3 hours/day.     If plan is discharge home, recommend the following: Two people to help with walking and/or transfers;Two people to help with bathing/dressing/bathroom;Assistance with cooking/housework;Assist for transportation;Help with stairs or ramp for entrance   Can travel by private vehicle     No  Equipment Recommendations  Rolling walker (2 wheels)    Recommendations for Other Services       Precautions / Restrictions Precautions Precautions: Fall     Mobility  Bed Mobility Overal bed mobility: Needs Assistance Bed Mobility: Supine to Sit, Sit to Supine     Supine to sit: HOB elevated, +2 for physical assistance, Total assist Sit to supine: Total assist, +2 for physical assistance   General bed mobility comments: mutlimodal cues for technique, provided time as well but pt not able to initiate and required increased assist    Transfers Overall transfer level: Needs assistance Equipment used: Rolling walker (2 wheels) Transfers: Sit to/from Stand Sit to Stand: Max assist, +2 physical assistance, From elevated  surface           General transfer comment: multimodal cues for technique, weight shifting; pt with posterior lean and not putting weight through UEs on RW, verbal and tactile/manual cues for upright trunk posture which briefly improved; pt unable to take steps; performed sit to stands x3 for technique and strengthening    Ambulation/Gait                   Stairs             Wheelchair Mobility     Tilt Bed    Modified Rankin (Stroke Patients Only)       Balance                                            Cognition Arousal: Alert Behavior During Therapy: Flat affect Overall Cognitive Status: Impaired/Different from baseline Area of Impairment: Following commands                       Following Commands: Follows one step commands inconsistently       General Comments: cognition slightly improved since previous session however pt still with difficulty following commands despite multimodal cuing        Exercises      General Comments        Pertinent Vitals/Pain Pain Assessment Pain Assessment: No/denies pain Pain Intervention(s): Monitored during session, Repositioned    Home Living  Prior Function            PT Goals (current goals can now be found in the care plan section) Progress towards PT goals: Not progressing toward goals - comment (cognition and weakness limiting)    Frequency    Min 1X/week      PT Plan      Co-evaluation              AM-PAC PT "6 Clicks" Mobility   Outcome Measure  Help needed turning from your back to your side while in a flat bed without using bedrails?: A Lot Help needed moving from lying on your back to sitting on the side of a flat bed without using bedrails?: Total Help needed moving to and from a bed to a chair (including a wheelchair)?: Total Help needed standing up from a chair using your arms (e.g., wheelchair or bedside  chair)?: Total Help needed to walk in hospital room?: Total Help needed climbing 3-5 steps with a railing? : Total 6 Click Score: 7    End of Session Equipment Utilized During Treatment: Gait belt Activity Tolerance: Patient limited by fatigue Patient left: in bed;with call bell/phone within reach;with family/visitor present Nurse Communication: Mobility status PT Visit Diagnosis: Other symptoms and signs involving the nervous system (R29.898);Other abnormalities of gait and mobility (R26.89)     Time: 8657-8469 PT Time Calculation (min) (ACUTE ONLY): 20 min  Charges:    $Therapeutic Activity: 8-22 mins PT General Charges $$ ACUTE PT VISIT: 1 Visit                     Paulino Door, DPT Physical Therapist Acute Rehabilitation Services Office: [phone]    Scott Wang 09/13/2023, 4:31 PM

## 2023-09-14 ENCOUNTER — Inpatient Hospital Stay (HOSPITAL_COMMUNITY): Payer: Medicare PPO

## 2023-09-14 DIAGNOSIS — E785 Hyperlipidemia, unspecified: Secondary | ICD-10-CM | POA: Diagnosis not present

## 2023-09-14 DIAGNOSIS — I959 Hypotension, unspecified: Secondary | ICD-10-CM | POA: Diagnosis not present

## 2023-09-14 DIAGNOSIS — F0390 Unspecified dementia without behavioral disturbance: Secondary | ICD-10-CM | POA: Diagnosis not present

## 2023-09-14 DIAGNOSIS — N179 Acute kidney failure, unspecified: Secondary | ICD-10-CM | POA: Diagnosis not present

## 2023-09-14 LAB — CULTURE, BLOOD (ROUTINE X 2): Special Requests: ADEQUATE

## 2023-09-14 MED ORDER — SODIUM BICARBONATE 650 MG PO TABS
650.0000 mg | ORAL_TABLET | Freq: Two times a day (BID) | ORAL | Status: DC
  Administered 2023-09-14 – 2023-09-16 (×6): 650 mg via ORAL
  Filled 2023-09-14 (×6): qty 1

## 2023-09-14 MED ORDER — BENZONATATE 100 MG PO CAPS
200.0000 mg | ORAL_CAPSULE | Freq: Three times a day (TID) | ORAL | Status: DC | PRN
  Administered 2023-09-14: 200 mg via ORAL
  Filled 2023-09-14: qty 2

## 2023-09-14 NOTE — Progress Notes (Signed)
[facility]                                                                               Scott Wang, is a 82 y.o. male, DOB - [birth year], FAO:[id number] Admit date - 09/10/2023    Outpatient Primary MD for the patient is [name], DO  LOS - 4  days    Brief summary    82 y.o. male with medical history significant for Parkinson's disease, hypertension, hyperlipidemia, right-sided renal mass extending into the renal sinus without extension outside of the renal capsule or involvement of the renal vein (findings worrisome for renal cell carcinoma, seen on MRI 07/02/2023-followed by urology), who presented from home due to generalized weakness and a fall due to loss of balance .  Patient was hypotensive, with leukocytosis . He was found to be in AKI and UTI.  Assessment & Plan    Assessment and Plan:   Urinary tract infection present on admission Right flank pain with concern for pyelonephritis In the setting of right renal mass  Renal ultrasound reviewed with the patient and his wife. As per the wife they just saw Dr Mena Goes on Wednesday and plan was to watch the progression of the renal mass.  Urine cultures show klebsiella, sensitivities  reviewed. Plan to transtion to oral antibiotics on discharge.  Meanwhile continue with IV ceftriaxone.   Klebsiella bacteremia  Continue with IV ceftriaxone.  Repeat blood cultures pending.  Improving leukocytosis. 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  Non anion gap metabolic acidosis.  Baseline creatinine at 1.3, creatinine on admission around 3, improved to 2.85 to 2.48 with IV fluids. Hold fluids and check renal parameters in am.  Bicarb still at 18. Will add sodium bicarb tablets.  Continue to monitor.    Hypertension; well controlled BP parameters.   Parkinson's disease;  Resume carbidopa.   Hyperlipidemia:  Resume lipitor 10 mg  daily.    Dementia:  Continue with Aricept 5 mg daily.    Hyponatremia:  Sodium of 134 to 129 to 130. Osmolality is 288, TSH wnl.  URINE sodium is 62  Therapy evaluations done, family wanted to pursue SNF.  Toc on board.     Estimated body mass index is 26.21 kg/m as calculated from the following:   Height as of this encounter: 5\' 7"  (1.702 m).   Weight as of this encounter: 75.9 kg.  Code Status: full code.  DVT Prophylaxis:  enoxaparin (LOVENOX) injection 30 mg Start: 09/11/23 1000   Level of Care: Level of care: Progressive Family Communication: wife at bedside.   Disposition Plan:     Remains inpatient appropriate:  IV antibiotics.   Procedures:  None.   Consultants:   Palliative care   Antimicrobials:   Anti-infectives (From admission, onward)    Start     Dose/Rate Route Frequency Ordered Stop   09/11/23 0145  cefTRIAXone (ROCEPHIN) 2 g in sodium chloride 0.9 % 100 mL IVPB        2 g 200 mL/hr over 30 Minutes Intravenous Every 24 hours 09/11/23 0058  Medications  Scheduled Meds:  alfuzosin  10 mg Oral Q breakfast   atorvastatin  10 mg Oral QHS   carbidopa-levodopa  1 tablet Oral TID   vitamin B-12  2,500 mcg Oral Daily   donepezil  5 mg Oral QHS   enoxaparin (LOVENOX) injection  30 mg Subcutaneous Q24H   Continuous Infusions:  cefTRIAXone (ROCEPHIN)  IV 2 g (09/13/23 2120)   PRN Meds:.acetaminophen, melatonin, oxyCODONE, polyethylene glycol, prochlorperazine    Subjective:   Scott Wang was seen and examined today. No new complaints today.   Objective:   Vitals:   09/13/23 1314 09/13/23 2113 09/14/23 0427 09/14/23 1318  BP: 136/64 (!) 160/85 133/73 129/66  Pulse: 73 77 62 71  Resp: 20 18 18 16   Temp: 98 F (36.7 C) 98 F (36.7 C) (!) 97.5 F (36.4 C) (!) 97.4 F (36.3 C)  TempSrc: Oral Oral Oral Oral  SpO2: 97% 92% 97% 95%  Weight:      Height:        Intake/Output Summary (Last 24 hours) at 09/14/2023 1412 Last data  filed at 09/14/2023 0937 Gross per 24 hour  Intake 120 ml  Output 1350 ml  Net -1230 ml   Filed Weights   09/11/23 0055  Weight: 75.9 kg     Exam General exam: Appears calm and comfortable  Respiratory system: Clear to auscultation. Respiratory effort normal. Cardiovascular system: S1 & S2 heard, RRR. No JVD, Gastrointestinal system: Abdomen is nondistended, soft and nontender.  Central nervous system: Alert and oriented to person and place.  Extremities: no pedal edema.  Skin: No rashes,  Psychiatry:  Mood  is appropriate.       Data Reviewed:  I have personally reviewed following labs and imaging studies   CBC Lab Results  Component Value Date   WBC 11.9 (H) 09/13/2023   RBC 3.45 (L) 09/13/2023   HGB 10.7 (L) 09/13/2023   HCT 31.6 (L) 09/13/2023   MCV 91.6 09/13/2023   MCH 31.0 09/13/2023   PLT 162 09/13/2023   MCHC 33.9 09/13/2023   RDW 14.5 09/13/2023   LYMPHSABS 0.7 09/13/2023   MONOABS 1.0 09/13/2023   EOSABS 0.0 09/13/2023   BASOSABS 0.0 09/13/2023     Last metabolic panel Lab Results  Component Value Date   NA 130 (L) 09/13/2023   K 3.9 09/13/2023   CL 104 09/13/2023   CO2 18 (L) 09/13/2023   BUN 58 (H) 09/13/2023   CREATININE 2.48 (H) 09/13/2023   GLUCOSE 113 (H) 09/13/2023   GFRNONAA 25 (L) 09/13/2023   GFRAA 57 (L) 09/17/2020   CALCIUM 8.8 (L) 09/13/2023   PHOS 3.3 09/12/2023   PROT 5.7 (L) 09/10/2023   ALBUMIN 2.6 (L) 09/12/2023   LABGLOB 2.4 09/17/2020   AGRATIO 1.7 09/17/2020   BILITOT 1.2 09/10/2023   ALKPHOS 34 (L) 09/10/2023   AST 26 09/10/2023   ALT 19 09/10/2023   ANIONGAP 8 09/13/2023    CBG (last 3)  No results for input(s): "GLUCAP" in the last 72 hours.    Coagulation Profile: No results for input(s): "INR", "PROTIME" in the last 168 hours.   Radiology Studies: No results found.     Kathlen Mody M.D. [facility] 09/14/2023, 2:12 PM  Available via Epic secure chat 7am-7pm After 7 pm, please refer to  night coverage provider listed on amion.

## 2023-09-14 NOTE — Plan of Care (Signed)
  Problem: Education: Goal: Knowledge of General Education information will improve Description: Including pain rating scale, medication(s)/side effects and non-pharmacologic comfort measures Outcome: Progressing   Problem: Health Behavior/Discharge Planning: Goal: Ability to manage health-related needs will improve Outcome: Progressing   Problem: Clinical Measurements: Goal: Ability to maintain clinical measurements within normal limits will improve Outcome: Progressing   Problem: Activity: Goal: Risk for activity intolerance will decrease Outcome: Progressing   Problem: Nutrition: Goal: Adequate nutrition will be maintained Outcome: Progressing   Problem: Safety: Goal: Ability to remain free from injury will improve Outcome: Progressing   Problem: Clinical Measurements: Goal: Will remain free from infection Outcome: Adequate for Discharge Goal: Diagnostic test results will improve Outcome: Adequate for Discharge Goal: Respiratory complications will improve Outcome: Adequate for Discharge Goal: Cardiovascular complication will be avoided Outcome: Adequate for Discharge   Problem: Coping: Goal: Level of anxiety will decrease Outcome: Adequate for Discharge   Problem: Elimination: Goal: Will not experience complications related to bowel motility Outcome: Adequate for Discharge Goal: Will not experience complications related to urinary retention Outcome: Adequate for Discharge   Problem: Pain Management: Goal: General experience of comfort will improve Outcome: Adequate for Discharge   Problem: Skin Integrity: Goal: Risk for impaired skin integrity will decrease Outcome: Adequate for Discharge

## 2023-09-14 NOTE — TOC Progression Note (Signed)
Transition of Care [facility]) - Progression Note    Patient Details  Name: BENIGNO CHECK MRN: [id number] Date of Birth: May 26, 1941  Transition of [facility]) CM/SW Contact  Lexus Shampine, Olegario Messier, RN Phone Number: 09/14/2023, 3:21 PM  Clinical Narrative:  PT recc [facility] SN[facility];spouse Erskine Squibb agree to [facility] SNF;faxed out await pasrr, & 30day note co sign prior bed offers,choice,then auth.     Expected Discharge Plan: Skilled Nursing Facility Barriers to Discharge: Continued Medical Work up  Expected Discharge Plan and Services   Discharge Planning Services: CM Consult Post Acute Care Choice: Resumption of Svcs/PTA Provider Living arrangements for the past 2 months: Single Family Home                                       Social Determinants of Health (SDOH) Interventions SDOH Screenings   Food Insecurity: No Food Insecurity (09/11/2023)  Housing: Low Risk  (09/11/2023)  Transportation Needs: No Transportation Needs (09/11/2023)  Utilities: Not At Risk (09/11/2023)  Tobacco Use: Medium Risk (09/12/2023)    Readmission Risk Interventions     No data to display

## 2023-09-14 NOTE — Plan of Care (Signed)
  Problem: Education: Goal: Knowledge of General Education information will improve Description: Including pain rating scale, medication(s)/side effects and non-pharmacologic comfort measures Outcome: Progressing   Problem: Clinical Measurements: Goal: Ability to maintain clinical measurements within normal limits will improve Outcome: Progressing Goal: Will remain free from infection Outcome: Progressing Goal: Diagnostic test results will improve Outcome: Progressing Goal: Respiratory complications will improve Outcome: Progressing Goal: Cardiovascular complication will be avoided Outcome: Progressing   Problem: Nutrition: Goal: Adequate nutrition will be maintained Outcome: Progressing   Problem: Elimination: Goal: Will not experience complications related to bowel motility Outcome: Progressing Goal: Will not experience complications related to urinary retention Outcome: Progressing   

## 2023-09-14 NOTE — TOC PASRR Note (Signed)
30 Day PASRR Note   Patient Details  Name: Scott Wang Date of Birth: Jan 31, 1941   Transition of Care [facility]) CM/SW Contact:    Lanier Clam, RN Phone Number: 09/14/2023, 3:19 PM  To Whom It May Concern:  Please be advised that this patient will require a short-term nursing home stay - anticipated 30 days or less for rehabilitation and strengthening.   The plan is for return home.

## 2023-09-14 NOTE — [place] FL2 (Signed)
[place] MEDICAID FL2 LEVEL OF CARE FORM     IDENTIFICATION  Patient Name: Scott Wang Birthdate: [birth year] Sex: male Admission Date (Current Location): 09/10/2023  [facility] and [place] Number:  Producer, television/film/video and Address:  [facility],  [address] 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   Muscle stiffness due to parkinson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;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   Parkinson's disease (HCC) 12/02/2020   Malignant neoplasm of prostate (HCC) 05/05/2018   Hypertension 06/10/2011   Hyperlipidemia 06/10/2011   Diabetes mellitus (HCC) 06/10/2011   Onychomycosis 06/10/2011   Paronychia 06/10/2011    Orientation RESPIRATION BLADDER Height & Weight     Self, Time, Situation, Place  Normal Continent Weight: 75.9 kg Height:  5\' 7"  (170.2 cm)  BEHAVIORAL SYMPTOMS/MOOD NEUROLOGICAL BOWEL NUTRITION STATUS      Continent Diet (Heart Healthy)  AMBULATORY STATUS COMMUNICATION OF NEEDS Skin   Limited Assist Verbally Normal                       Personal Care Assistance Level of Assistance  Bathing, Feeding, Dressing Bathing Assistance: Limited assistance Feeding assistance: Limited assistance Dressing Assistance: Limited assistance     Functional Limitations Info  Sight, Hearing, Speech Sight Info: Impaired  (readers) Hearing Info: Impaired (Bilateral hearing aids) Speech Info: Adequate    SPECIAL CARE FACTORS FREQUENCY  PT (By licensed PT), OT (By licensed OT)     PT Frequency: 5x week OT Frequency: 5x week            Contractures Contractures Info: Not present    Additional Factors Info  Code Status, Allergies, Psychotropic Code Status Info: Full Allergies Info: Sulfa antibiotics Psychotropic Info: aricept         Current Medications (09/14/2023):  This is the current hospital active medication list Current Facility-Administered Medications  Medication Dose Route Frequency Provider Last Rate Last Admin   acetaminophen (TYLENOL) tablet 650 mg  650 mg Oral Q6H PRN Darlin Drop, DO   650 mg at 09/14/23 0004   alfuzosin (UROXATRAL) 24 hr tablet 10 mg  10 mg Oral Q breakfast Dow Adolph N, DO   10 mg at 09/14/23 0753   atorvastatin (LIPITOR) tablet 10 mg  10 mg Oral QHS Dow Adolph N, DO   10 mg at 09/13/23 2116   carbidopa-levodopa (SINEMET IR) 25-100 MG per tablet immediate release 1 tablet  1 tablet Oral TID Kathlen Mody, MD   1 tablet at 09/14/23 1355   cefTRIAXone (ROCEPHIN) 2 g in sodium chloride 0.9 % 100 mL IVPB  2 g Intravenous Q24H Dow Adolph N, DO 200 mL/hr at 09/13/23 2120 2 g at 09/13/23 2120   cyanocobalamin (VITAMIN B12) tablet 2,500 mcg  2,500 mcg Oral Daily Dow Adolph N, DO   2,500 mcg at  09/14/23 1059   donepezil (ARICEPT) tablet 5 mg  5 mg Oral QHS Hall, Carole N, DO   5 mg at 09/13/23 2116   enoxaparin (LOVENOX) injection 30 mg  30 mg Subcutaneous Q24H Hall, Carole N, DO   30 mg at 09/14/23 1100   melatonin tablet 5 mg  5 mg Oral QHS PRN Dow Adolph N, DO   5 mg at 09/13/23 2116   oxyCODONE (ROXICODONE) 5 MG/5ML solution 5 mg  5 mg Oral Q4H PRN Dow Adolph N, DO   5 mg at 09/14/23 0752   polyethylene glycol (MIRALAX / GLYCOLAX) packet 17 g  17 g Oral Daily PRN Dow Adolph N, DO   17 g at 09/14/23 1403   prochlorperazine (COMPAZINE) injection 5 mg  5 mg  Intravenous Q6H PRN Dow Adolph N, DO       sodium bicarbonate tablet 650 mg  650 mg Oral BID Kathlen Mody, MD         Discharge Medications: Please see discharge summary for a list of discharge medications.  Relevant Imaging Results:  Relevant Lab Results:   Additional Information [id number], [place], [place]

## 2023-09-15 DIAGNOSIS — N179 Acute kidney failure, unspecified: Secondary | ICD-10-CM | POA: Diagnosis not present

## 2023-09-15 DIAGNOSIS — I959 Hypotension, unspecified: Secondary | ICD-10-CM | POA: Diagnosis not present

## 2023-09-15 DIAGNOSIS — F0390 Unspecified dementia without behavioral disturbance: Secondary | ICD-10-CM | POA: Diagnosis not present

## 2023-09-15 DIAGNOSIS — E785 Hyperlipidemia, unspecified: Secondary | ICD-10-CM | POA: Diagnosis not present

## 2023-09-15 LAB — BASIC METABOLIC PANEL
Anion gap: 10 (ref 5–15)
BUN: 57 mg/dL — ABNORMAL HIGH (ref 8–23)
CO2: 20 mmol/L — ABNORMAL LOW (ref 22–32)
Calcium: 8.9 mg/dL (ref 8.9–10.3)
Chloride: 99 mmol/L (ref 98–111)
Creatinine, Ser: 2.07 mg/dL — ABNORMAL HIGH (ref 0.61–1.24)
GFR, Estimated: 32 mL/min — ABNORMAL LOW (ref >60)
Glucose, Bld: 112 mg/dL — ABNORMAL HIGH (ref 70–99)
Potassium: 4 mmol/L (ref 3.5–5.1)
Sodium: 129 mmol/L — ABNORMAL LOW (ref 135–145)

## 2023-09-15 LAB — CBC WITH DIFFERENTIAL/PLATELET
Abs Immature Granulocytes: 0.61 10*3/uL — ABNORMAL HIGH (ref 0.00–0.07)
Basophils Absolute: 0.1 10*3/uL (ref 0.0–0.1)
Basophils Relative: 1 %
Eosinophils Absolute: 0.1 10*3/uL (ref 0.0–0.5)
Eosinophils Relative: 1 %
HCT: 32.6 % — ABNORMAL LOW (ref 39.0–52.0)
Hemoglobin: 11 g/dL — ABNORMAL LOW (ref 13.0–17.0)
Immature Granulocytes: 6 %
Lymphocytes Relative: 8 %
Lymphs Abs: 0.8 10*3/uL (ref 0.7–4.0)
MCH: 30.6 pg (ref 26.0–34.0)
MCHC: 33.7 g/dL (ref 30.0–36.0)
MCV: 90.6 fL (ref 80.0–100.0)
Monocytes Absolute: 1.3 10*3/uL — ABNORMAL HIGH (ref 0.1–1.0)
Monocytes Relative: 13 %
Neutro Abs: 6.7 10*3/uL (ref 1.7–7.7)
Neutrophils Relative %: 71 %
Platelets: 185 10*3/uL (ref 150–400)
RBC: 3.6 MIL/uL — ABNORMAL LOW (ref 4.22–5.81)
RDW: 14.4 % (ref 11.5–15.5)
WBC: 9.6 10*3/uL (ref 4.0–10.5)
nRBC: 0 % (ref 0.0–0.2)

## 2023-09-15 MED ORDER — OXYCODONE HCL 5 MG PO TABS: 5.0000 mg | ORAL_TABLET | ORAL | Status: DC | PRN

## 2023-09-15 MED ORDER — ALUM & MAG HYDROXIDE-SIMETH 200-200-20 MG/5ML PO SUSP
15.0000 mL | Freq: Four times a day (QID) | ORAL | Status: DC | PRN
  Administered 2023-09-15: 15 mL via ORAL
  Filled 2023-09-15: qty 30

## 2023-09-15 MED ORDER — PANTOPRAZOLE SODIUM 40 MG PO TBEC
40.0000 mg | DELAYED_RELEASE_TABLET | Freq: Every day | ORAL | Status: DC
  Administered 2023-09-15 – 2023-09-17 (×3): 40 mg via ORAL
  Filled 2023-09-15 (×3): qty 1

## 2023-09-15 NOTE — TOC Progression Note (Addendum)
Transition of Care [facility]) - Progression Note    Patient Details  Name: Scott Wang MRN: [id number] Date of Birth: 07-18-1941  Transition of Care [facility]) CM/SW Contact  Idona Stach, Olegario Messier, RN Phone Number: 09/15/2023, 3:27 PM  Clinical Narrative: Received pasrr(added to fl2);bed offers given await choice prior auth.      Expected Discharge Plan: Skilled Nursing Facility Barriers to Discharge: Continued Medical Work up  Expected Discharge Plan and Services   Discharge Planning Services: CM Consult Post Acute Care Choice: Resumption of Svcs/PTA Provider Living arrangements for the past 2 months: Single Family Home                                       Social Determinants of Health (SDOH) Interventions SDOH Screenings   Food Insecurity: No Food Insecurity (09/11/2023)  Housing: Low Risk  (09/11/2023)  Transportation Needs: No Transportation Needs (09/11/2023)  Utilities: Not At Risk (09/11/2023)  Tobacco Use: Medium Risk (09/12/2023)    Readmission Risk Interventions     No data to display

## 2023-09-15 NOTE — Progress Notes (Signed)
Occupational Therapy Treatment Patient Details Name: Scott Wang MRN: [id number] DOB: [birth year] Today's Date: 09/15/2023   History of present illness Patient is a 82 year old male with medical history significant for Parkinson's disease, hypertension, hyperlipidemia, right-sided renal mass extending into the renal sinus without extension outside of the renal capsule or involvement of the renal vein (findings worrisome for renal cell carcinoma, seen on MRI 07/02/2023-followed by urology), who presented from home due to generalized weakness and a fall tonight due to loss of balance.  Pt admitted on 09/10/23 for AKI and UTI   OT comments  Patient was noted to have a difficult time with self feeding with typical utensils and cups. Patients tremors were minimal on this date with addition of red foam and bilateral handled cup being sufficient to increasing independence. Wife was present in room and educated on adaptations and how to carry them over in next level of care. Patient will benefit from continued inpatient follow up therapy, <3 hours/day       If plan is discharge home, recommend the following:  Two people to help with walking and/or transfers;Two people to help with bathing/dressing/bathroom;Direct supervision/assist for medications management;Direct supervision/assist for financial management;Assist for transportation;Help with stairs or ramp for entrance;Supervision due to cognitive status;Assistance with feeding;Assistance with cooking/housework   Equipment Recommendations  None recommended by OT       Precautions / Restrictions Precautions Precautions: Fall Restrictions Weight Bearing Restrictions: No              ADL either performed or assessed with clinical judgement   ADL Overall ADL's : Needs assistance/impaired Eating/Feeding: Moderate assistance;Sitting Eating/Feeding Details (indicate cue type and reason): patient noted to have some shakiness with BUE during  session with good response to theraputic use of hand to calm tremors. wife reported that when they go out if they get too bad she would do the same. patient was able to use red foam on spoon and bring it to mouth with no issue simulated reaching task. patient declined to eat anything at this time. patient was able to hold the ensure container but noted to need both hands and have difficulty with increased time to get to mouth. patients ensure container was placed in bilateral handled cup and provided to patient with patient having quicker time to bring to mouth to take sips. patient's wife was educated on weighted utensils and other adaptive self feeding items that are available on market. patients wife verbalized understanding reporting she would look into it.              Cognition Arousal: Alert Behavior During Therapy: Flat affect Overall Cognitive Status: Impaired/Different from baseline Area of Impairment: Following commands       Following Commands: Follows one step commands inconsistently                           Pertinent Vitals/ Pain       Pain Assessment Pain Assessment: No/denies pain         Frequency           Progress Toward Goals  OT Goals(current goals can now be found in the care plan section)  Progress towards OT goals: Progressing toward goals     Plan         AM-PAC OT "6 Clicks" Daily Activity     Outcome Measure   Help from another person eating meals?: A Little Help from another person taking  care of personal grooming?: A Lot Help from another person toileting, which includes using toliet, bedpan, or urinal?: Total Help from another person bathing (including washing, rinsing, drying)?: Total Help from another person to put on and taking off regular upper body clothing?: A Lot Help from another person to put on and taking off regular lower body clothing?: Total 6 Click Score: 10    End of Session    OT Visit Diagnosis: Repeated  falls (R29.6);Muscle weakness (generalized) (M62.81);Other abnormalities of gait and mobility (R26.89)   Activity Tolerance Patient limited by lethargy   Patient Left in bed;with call bell/phone within reach;with bed alarm set;with family/visitor present   Nurse Communication Mobility status        Time: 1540-1602 OT Time Calculation (min): 22 min  Charges: OT General Charges $OT Visit: 1 Visit OT Treatments $Self Care/Home Management : 8-22 mins  Rosalio Loud, MS Acute Rehabilitation Department Office# [phone]   Selinda Flavin 09/15/2023, 4:36 PM

## 2023-09-15 NOTE — Progress Notes (Signed)
[facility]                                                                               Scott Wang, is a 82 y.o. male, DOB - 05-15-1941, LKG:[id number] Admit date - 09/10/2023    Outpatient Primary MD for the patient is [name], DO  LOS - 5  days    Brief summary    82 y.o. male with medical history significant for Parkinson's disease, hypertension, hyperlipidemia, right-sided renal mass extending into the renal sinus without extension outside of the renal capsule or involvement of the renal vein (findings worrisome for renal cell carcinoma, seen on MRI 07/02/2023-followed by urology), who presented from home due to generalized weakness and a fall due to loss of balance .  Patient was hypotensive, with leukocytosis . He was found to be in AKI and UTI.  Assessment & Plan    Assessment and Plan:   Urinary tract infection present on admission Right flank pain with concern for pyelonephritis In the setting of right renal mass  Renal ultrasound reviewed with the patient and his wife. As per the wife they just saw Dr Mena Goes on Wednesday and plan was to watch the progression of the renal mass.  Urine cultures show klebsiella, sensitivities  reviewed. Plan to transtion to oral antibiotics on discharge.  Meanwhile continue with IV ceftriaxone. Wbc count normalized. Lactic acid normalized.    Klebsiella bacteremia  Continue with IV ceftriaxone, transition to oral ciprofloxacin on discharge.  Repeat blood cultures pending and negative so far Improving leukocytosis. 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  Non anion gap metabolic acidosis.  Baseline creatinine at 1.3, creatinine on admission around 3, improved to 2.85 to 2.48 to 2 today.  Bicarb improving.  Continue to monitor.    Hypertension; well controlled BP parameters.   Parkinson's disease;  Resume  carbidopa.   Hyperlipidemia:  Resume lipitor 10 mg daily.    Dementia:  Continue with Aricept 5 mg daily.    Hyponatremia:  Sodium of  129 to 134.  Osmolality is 288, TSH wnl.  URINE sodium is 62.   Intermittent dry cough when drinking fluids.  SLP evaluation ordered.  ? GERD. PPI added.  CXR showed atelectasis. IS ordered.    Therapy evaluations done, family wanted to pursue SNF.  Toc on board.     Estimated body mass index is 26.21 kg/m as calculated from the following:   Height as of this encounter: 5\' 7"  (1.702 m).   Weight as of this encounter: 75.9 kg.  Code Status: full code.  DVT Prophylaxis:  enoxaparin (LOVENOX) injection 30 mg Start: 09/11/23 1000   Level of Care: Level of care: Progressive Family Communication: wife at bedside.   Disposition Plan:     Remains inpatient appropriate:  SNF when bed available.   Procedures:  None.   Consultants:   Palliative care   Antimicrobials:   Anti-infectives (From admission, onward)    Start     Dose/Rate Route Frequency Ordered Stop   09/11/23 0145  cefTRIAXone (ROCEPHIN) 2 g in sodium chloride  0.9 % 100 mL IVPB        2 g 200 mL/hr over 30 Minutes Intravenous Every 24 hours 09/11/23 0058          Medications  Scheduled Meds:  alfuzosin  10 mg Oral Q breakfast   atorvastatin  10 mg Oral QHS   carbidopa-levodopa  1 tablet Oral TID   vitamin B-12  2,500 mcg Oral Daily   donepezil  5 mg Oral QHS   enoxaparin (LOVENOX) injection  30 mg Subcutaneous Q24H   pantoprazole  40 mg Oral Daily   sodium bicarbonate  650 mg Oral BID   Continuous Infusions:  cefTRIAXone (ROCEPHIN)  IV 2 g (09/14/23 2100)   PRN Meds:.acetaminophen, benzonatate, melatonin, oxyCODONE, polyethylene glycol, prochlorperazine    Subjective:   Scott Wang was seen and examined today. No new complaints today.   Objective:   Vitals:   09/14/23 2126 09/15/23 0357 09/15/23 1339 09/15/23 1346  BP: (!) 159/75 (!) 160/81 (!)  155/74   Pulse: 85 77 79   Resp: 20 (!) 22 (!) 26 (!) 22  Temp: 99.6 F (37.6 C) 98.2 F (36.8 C) 97.8 F (36.6 C)   TempSrc: Oral Oral Oral   SpO2: 96% 95% 96%   Weight:      Height:        Intake/Output Summary (Last 24 hours) at 09/15/2023 1542 Last data filed at 09/15/2023 1345 Gross per 24 hour  Intake 240 ml  Output 1550 ml  Net -1310 ml   Filed Weights   09/11/23 0055  Weight: 75.9 kg     Exam General exam: elderly gentleman in bed, not in distress.  Respiratory system: Clear to auscultation. Respiratory effort normal. Cardiovascular system: S1 & S2 heard, RRR. No JVD,  Gastrointestinal system: Abdomen is nondistended, soft and nontender.  Central nervous system: Alert and oriented. Grossly non focal.  Extremities: No pedal edema.  Skin: No rashes,  Psychiatry: Mood & affect appropriate.        Data Reviewed:  I have personally reviewed following labs and imaging studies   CBC Lab Results  Component Value Date   WBC 9.6 09/15/2023   RBC 3.60 (L) 09/15/2023   HGB 11.0 (L) 09/15/2023   HCT 32.6 (L) 09/15/2023   MCV 90.6 09/15/2023   MCH 30.6 09/15/2023   PLT 185 09/15/2023   MCHC 33.7 09/15/2023   RDW 14.4 09/15/2023   LYMPHSABS 0.8 09/15/2023   MONOABS 1.3 (H) 09/15/2023   EOSABS 0.1 09/15/2023   BASOSABS 0.1 09/15/2023     Last metabolic panel Lab Results  Component Value Date   NA 129 (L) 09/15/2023   K 4.0 09/15/2023   CL 99 09/15/2023   CO2 20 (L) 09/15/2023   BUN 57 (H) 09/15/2023   CREATININE 2.07 (H) 09/15/2023   GLUCOSE 112 (H) 09/15/2023   GFRNONAA 32 (L) 09/15/2023   GFRAA 57 (L) 09/17/2020   CALCIUM 8.9 09/15/2023   PHOS 3.3 09/12/2023   PROT 5.7 (L) 09/10/2023   ALBUMIN 2.6 (L) 09/12/2023   LABGLOB 2.4 09/17/2020   AGRATIO 1.7 09/17/2020   BILITOT 1.2 09/10/2023   ALKPHOS 34 (L) 09/10/2023   AST 26 09/10/2023   ALT 19 09/10/2023   ANIONGAP 10 09/15/2023    CBG (last 3)  No results for input(s): "GLUCAP" in the  last 72 hours.    Coagulation Profile: No results for input(s): "INR", "PROTIME" in the last 168 hours.   Radiology Studies: DG CHEST PORT 1 VIEW  Result Date: 09/14/2023 CLINICAL DATA:  Cough, pyelonephritis, UTI EXAM: PORTABLE CHEST 1 VIEW COMPARISON:  09/10/2023 chest radiograph. FINDINGS: Low lung volumes. Stable cardiomediastinal silhouette with normal heart size. No pneumothorax. No pleural effusion. No overt pulmonary edema. Mild streaky hazy bibasilar lung opacities, slightly increased. IMPRESSION: Low lung volumes with mild streaky hazy bibasilar lung opacities, slightly increased, favor atelectasis. Electronically Signed   By: Delbert Phenix M.D.   On: 09/14/2023 21:01       Kathlen Mody M.D. [facility] 09/15/2023, 3:42 PM  Available via Epic secure chat 7am-7pm After 7 pm, please refer to night coverage provider listed on amion.

## 2023-09-15 NOTE — Progress Notes (Signed)
Clinical/Bedside Swallow Evaluation Patient Details  Name: Scott Wang MRN: [id number] Date of Birth: 05/26/1941  Today's Date: 09/15/2023 Time: SLP Start Time (ACUTE ONLY): 1100 SLP Stop Time (ACUTE ONLY): 1135 SLP Time Calculation (min) (ACUTE ONLY): 35 min  Past Medical History:  Past Medical History:  Diagnosis Date   Carpal tunnel syndrome    bilateral, had shots in each one and "haven't had any problems since".   Diabetes mellitus    Hyperlipidemia    Hypertension    Prostate cancer (HCC)    Spinal stenosis    Past Surgical History:  Past Surgical History:  Procedure Laterality Date   HERNIA REPAIR     HERNIA REPAIR     INGUINAL HERNIA REPAIR     KNEE SURGERY  right   KNEE SURGERY Left    PROSTATE BIOPSY     RADIOACTIVE SEED IMPLANT N/A 09/07/2018   Procedure: RADIOACTIVE SEED IMPLANT/BRACHYTHERAPY IMPLANT;  Surgeon: Jerilee Field, MD;  Location: [facility];  Service: Urology;  Laterality: N/A;   SPACE OAR INSTILLATION N/A 09/07/2018   Procedure: SPACE OAR INSTILLATION;  Surgeon: Jerilee Field, MD;  Location: [facility];  Service: Urology;  Laterality: N/A;   HPI:  Pt is an 82 yo male with h/o Parkinsons (diagnosed approx 4-5 years ago), prostate cancer, prior smoker - quit before 2016, cognitive deficits, HLD, HTN, drooling - s/p Botox 08/2022 - pna in Oct 2023, right hip pain - orthopedic, adm with AMS, decreased po intake.  Pt CXR 09/14/2023 negative for acute findings, suspect ATX, low lung volumes, hazy streaky opacities.    Assessment / Plan / Recommendation  Clinical Impression  Patient presents with clinical indication of suspected oropharyngeal dysphagia. He also appeared with right facial asymmetry, decreased right labial seal  and impaired right buccal region sensation *trigeminal nerve.  Mostly characterized decreased right labial seal with anterior loss and right oral pocketing of solids and cough post-swallow of thin via  straw.    Pt did have difficulty feeding himself and benefited from hand over hand assist with cups.  Eructation noted post-liquid swallow as well as delayed coughing after snack, concerning for potential GERD - wife reports pt with belching with all liquids PTA.  No clinical indication of aspiration with Ensure, soda, nor chicken sandwich.    Recommend continue regular/thin diet - no straws with water.  Pt agreeable to consume Ensure with his medications.   Eructation noted post-liquid swallow as well as delayed coughing after snack, concerning for potential GERD.  Wife also reports pt coughing overnight while lying flat - and he reported occasional GERD to his pulmonologist in 2023.    He and his spouse are agreeable to plan. SLP Visit Diagnosis: Dysphagia, oropharyngeal phase (R13.12);Dysphagia, unspecified (R13.10)    Aspiration Risk  Mild aspiration risk    Diet Recommendation Regular;Thin liquid (soda, Ensure, OJ etc ok via straw, water via cup)    Liquid Administration via: Cup;Straw (no straw with water) Medication Administration:  (meds with ENsure) Supervision: Patient able to self feed Compensations: Slow rate;Small sips/bites;Follow solids with liquid Postural Changes: Seated upright at 90 degrees;Remain upright for at least 30 minutes after po intake    Other  Recommendations Oral Care Recommendations: Oral care BID    Recommendations for follow up therapy are one component of a multi-disciplinary discharge planning process, led by the attending physician.  Recommendations may be updated based on patient status, additional functional criteria and insurance authorization.  Follow up Recommendations Skilled  nursing-short term rehab (<3 hours/day)      Assistance Recommended at Discharge  full  Functional Status Assessment Patient has had a recent decline in their functional status and demonstrates the ability to make significant improvements in function in a reasonable and  predictable amount of time.  Frequency and Duration min 1 x/week  1 week       Prognosis Prognosis for improved oropharyngeal function: Fair Barriers to Reach Goals: Severity of deficits;Motivation      Swallow Study   General Date of Onset: 09/15/23 HPI: Pt is an 82 yo male with h/o Parkinsons (diagnosed approx 4-5 years ago), prostate cancer, prior smoker - quit before 2016, cognitive deficits, HLD, HTN, drooling - s/p Botox 08/2022 - pna in Oct 2023, right hip pain - orthopedic, adm with AMS, decreased po intake.  Pt CXR 09/14/2023 negative for acute findings, suspect ATX, low lung volumes, hazy streaky opacities. Type of Study: Bedside Swallow Evaluation Diet Prior to this Study: Regular;Thin liquids (Level 0) Temperature Spikes Noted: Yes Respiratory Status: Room air History of Recent Intubation: Yes Behavior/Cognition: Alert;Cooperative;Pleasant mood Oral Cavity Assessment: Dry Oral Care Completed by SLP: No Oral Cavity - Dentition: Adequate natural dentition Vision: Impaired for self-feeding Self-Feeding Abilities: Needs assist Patient Positioning: Upright in bed Baseline Vocal Quality: Low vocal intensity Volitional Cough: Weak Volitional Swallow: Able to elicit    Oral/Motor/Sensory Function Overall Oral Motor/Sensory Function: Mild impairment Facial ROM: Reduced right Facial Symmetry: Abnormal symmetry right Facial Strength: Reduced right Facial Sensation: Within Functional Limits Lingual ROM: Within Functional Limits Lingual Symmetry: Within Functional Limits Lingual Strength: Within Functional Limits Lingual Sensation: Within Functional Limits Velum: Within Functional Limits Mandible: Within Functional Limits   Ice Chips Ice chips: Not tested   Thin Liquid Thin Liquid: Impaired Presentation: Cup;Straw;Self Fed Oral Phase Impairments: Reduced labial seal Oral Phase Functional Implications: Right anterior spillage Pharyngeal  Phase Impairments: Cough - Immediate  (with water via straw)    Nectar Thick Nectar Thick Liquid: Within functional limits Presentation: Self Fed;Straw   Honey Thick Honey Thick Liquid: Not tested   Puree Puree: Not tested   Solid     Solid: Impaired Presentation: Self Fed Oral Phase Impairments: Impaired mastication;Reduced labial seal Oral Phase Functional Implications: Right lateral sulci pocketing;Oral residue Pharyngeal Phase Impairments: Suspected delayed Swallow      Scott Wang 09/15/2023,1:52 PM   Scott Infante, MS [facility] SLP Acute Rehab Services Office [phone]

## 2023-09-15 NOTE — Plan of Care (Signed)

## 2023-09-15 NOTE — Progress Notes (Signed)
Speech Language Pathology Treatment: Dysphagia  Patient Details Name: Scott Wang MRN: [id number] DOB: 13-Dec-1940 Today's Date: 09/15/2023 Time: 4098-1191 SLP Time Calculation (min) (ACUTE ONLY): 25 min  Assessment / Plan / Recommendation Clinical Impression  Second session to address pt's dysphagia and review compensation strategies with pt and wife.  Advised pt's diet has been advanced to regular/thin *not heart healthy* to allow pt to self feed better given self feeding limitations.    Posted swallow precaution signs in room using teach back to recommendations for slightly thicker drinks, soda, OJ, etc with meals with straw usage approved.  Pt willing to take his medications with Ensure - as he does endorse some "coughing" with medications and liquids.  In addition, he endorses occasionally sensing retention of food (*pointing to distal pharynx) - stating he drinks liquids to help clear.  He admits he may not masticate well.    SLP advised that given pt's current medical issue - with decreased mobility, weakness, etc - his risk of asp pna is elevated if he does aspirate - so it behooves him to use caution.  Pt and wife report understanding to information provided.    Requested wife consider ordering "Live Vac" = anti-choking device for emergent purposes -- providing spouse with handout that reviewed information.  MD ordered PPI for pt - and advised reflux precautions.  Incentive Spirometer advised to not be used immediately post- meals/po due to known reflux issues.    Will follow up for dysphagia management and using teach back with pt's wife - education ongoing.          HPI HPI: Pt is an 82 yo male with h/o Parkinsons (diagnosed approx 4-5 years ago), prostate cancer, prior smoker - quit before 2016, cognitive deficits, HLD, HTN, drooling - s/p Botox 08/2022 - pna in Oct 2023, right hip pain - orthopedic, adm with AMS, decreased po intake.  Pt CXR 09/14/2023 negative for acute findings,  suspect ATX, low lung volumes, hazy streaky opacities.      SLP Plan         Recommendations for follow up therapy are one component of a multi-disciplinary discharge planning process, led by the attending physician.  Recommendations may be updated based on patient status, additional functional criteria and insurance authorization.    Recommendations  Diet recommendations: Regular;Thin liquid (slightly thicker liquids with meals and when using straws, no straw with thin water; thin water between meals) Liquids provided via: Cup Medication Administration:  (meds with ENsure) Supervision: Full supervision/cueing for compensatory strategies Compensations: Slow rate;Small sips/bites;Follow solids with liquid Postural Changes and/or Swallow Maneuvers: Seated upright 90 degrees;Upright 30-60 min after meal                  Oral care BID   Frequent or constant Supervision/Assistance Dysphagia, oropharyngeal phase (R13.12);Dysphagia, unspecified (R13.10)          Scott Infante, MS [facility] SLP Acute Rehab Services Office [phone]  Scott Wang  09/15/2023, 5:10 PM

## 2023-09-16 DIAGNOSIS — N179 Acute kidney failure, unspecified: Secondary | ICD-10-CM | POA: Diagnosis not present

## 2023-09-16 LAB — CULTURE, BLOOD (ROUTINE X 2): Special Requests: ADEQUATE

## 2023-09-16 LAB — SODIUM, URINE, RANDOM: Sodium, Ur: 86 mmol/L

## 2023-09-16 LAB — OSMOLALITY, URINE: Osmolality, Ur: 579 mosm/kg (ref 300–900)

## 2023-09-16 MED ORDER — CEFDINIR 300 MG PO CAPS: 300.0000 mg | ORAL_CAPSULE | Freq: Two times a day (BID) | ORAL | Status: DC

## 2023-09-16 MED ORDER — CARVEDILOL 3.125 MG PO TABS
3.1250 mg | ORAL_TABLET | Freq: Two times a day (BID) | ORAL | Status: DC
  Administered 2023-09-16 – 2023-09-17 (×2): 3.125 mg via ORAL
  Filled 2023-09-16 (×2): qty 1

## 2023-09-16 MED ORDER — CIPROFLOXACIN HCL 500 MG PO TABS
500.0000 mg | ORAL_TABLET | ORAL | Status: DC
Start: 2023-09-16 — End: 2023-09-17
  Administered 2023-09-16: 500 mg via ORAL
  Filled 2023-09-16: qty 1

## 2023-09-16 NOTE — Progress Notes (Signed)
HOSPITALIST ROUNDING NOTE Scott Wang ZOX:[id number]  DOB: 20-Jan-1941  DOA: 09/10/2023  PCP: [name], DO  09/16/2023,3:47 PM   LOS: 6 days      Code Status: full  From:  home    Current Dispo: not sure     82 year old white male Known prostate cancer hypertension diabetes hyperlipidemia Chronic low back pain status post selective nerve root blocks in the past follows with Dr. Ethelene Hal and Dr. Shon Baton '.  Known parkinsonism follows with [place] [facility] on Sinemet and had Botox injections previously Interstitial lung disease follows with Dr. Viann Fish 20-pack-year smoker quit in 2010 He also has a right-sided renal mass extending into his right renal sinus without extension outside of the renal capsule?  Renal cell CA followed by urology-Dr. Mena Goes who is aware of the patient and his watchfully waiting Brought into the hospital 09/10/2023 with right flank pain nausea vomiting and chills, and an accidental fall while at home He was hypotensive with a MAP in the 60s started on IV antibiotics white count 17 BUN/creatinine 38/2.9   Plan  Fall Requiring 2 mod-max assist--will need SNf when medically stable  Klebsiella bacteremia from UTI and sepsis on admit Rx Ceftriaxone, transition to ocipro 500 qd end date 09/19/23 [10 d]--BC x 2 repeat 11/3 ngtd  Renal mass Keep appt for Ct  c Dr. 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  SLP saw on 11/5 and cleared for diet  Parkinsonism Continue sinemet 1 tab bid, Aricept 5 mg daily  Htn Cont coreg 3.125 bid   DVT prophylaxis: lovenox  Status is: Inpatient Remains inpatient appropriate because:   Needs correction sodium and placement      Subjective: Awake coherent slightly slow to respond, appropriate No cp fever no chills  Objective + exam Vitals:   09/15/23 1944 09/15/23 2053 09/16/23 0509 09/16/23 1238  BP:  (!) 158/77 (!) 148/72 (!) 142/69  Pulse: 80 77 68 70  Resp: 20  20 (!) 24  Temp: 98.1 F (36.7 C)  98 F (36.7 C) 98.2 F (36.8 C)  TempSrc: Oral  Oral Oral  SpO2: 96%  98% 96%  Weight:      Height:       Filed Weights   09/11/23 0055  Weight: 75.9 kg    Examination:  Flat affect looks younger than age Cta b no rales, abd soft, no le edema Power 5/5 Sensory intact Full neuro exam deferred  Data Reviewed: reviewed   CBC    Component Value Date/Time   WBC 9.6 09/15/2023 0454   RBC 3.60 (L) 09/15/2023 0454   HGB 11.0 (L) 09/15/2023 0454   HGB 13.1 09/17/2020 1537   HCT 32.6 (L) 09/15/2023 0454   HCT 39.1 09/17/2020 1537   PLT 185 09/15/2023 0454   PLT 210 09/17/2020 1537   MCV 90.6 09/15/2023 0454   MCV 92 09/17/2020 1537   MCH 30.6 09/15/2023  0454   MCHC 33.7 09/15/2023 0454   RDW 14.4 09/15/2023 0454   RDW 12.7 09/17/2020 1537   LYMPHSABS 0.8 09/15/2023 0454   LYMPHSABS 1.4 09/17/2020 1537   MONOABS 1.3 (H) 09/15/2023 0454   EOSABS 0.1 09/15/2023 0454   EOSABS 0.2 09/17/2020 1537   BASOSABS 0.1 09/15/2023 0454   BASOSABS 0.0 09/17/2020 1537      Latest Ref Rng & Units 09/15/2023    4:54 AM 09/13/2023    5:32 AM 09/12/2023    5:20 AM  CMP  Glucose 70 - 99 mg/dL 469  629  528   BUN 8 - 23 mg/dL 57  58  52   Creatinine 0.61 - 1.24 mg/dL 4.13  2.44  0.10   Sodium 135 - 145 mmol/L 129  130  129   Potassium 3.5 - 5.1 mmol/L 4.0  3.9  4.3   Chloride 98 - 111 mmol/L 99  104  101   CO2 22 - 32 mmol/L 20   18  18    Calcium 8.9 - 10.3 mg/dL 8.9  8.8  9.0      Scheduled Meds:  alfuzosin  10 mg Oral Q breakfast   atorvastatin  10 mg Oral QHS   carbidopa-levodopa  1 tablet Oral TID   vitamin B-12  2,500 mcg Oral Daily   donepezil  5 mg Oral QHS   enoxaparin (LOVENOX) injection  30 mg Subcutaneous Q24H   pantoprazole  40 mg Oral Daily   sodium bicarbonate  650 mg Oral BID   Continuous Infusions:  cefTRIAXone (ROCEPHIN)  IV Stopped (09/15/23 2135)    Time  44  Rhetta Mura, MD  [facility]

## 2023-09-16 NOTE — TOC Progression Note (Signed)
Transition of Care [facility]) - Progression Note    Patient Details  Name: Scott Wang MRN: [id number] Date of Birth: 13-Jun-1941  Transition of Care [facility]) CM/SW Contact  Rea Reser, Olegario Messier, RN Phone Number: 09/16/2023, 4:15 PM  Clinical Narrative: Tamala Fothergill) chose [address]-faxed w/confirmation to [facility] health since unable to upload-ref#[id number]-awaiting auth for [employer] rep [place].     Expected Discharge Plan: Skilled Nursing Facility Barriers to Discharge: Insurance Authorization  Expected Discharge Plan and Services   Discharge Planning Services: CM Consult Post Acute Care Choice: Resumption of Svcs/PTA Provider Living arrangements for the past 2 months: Single Family Home                                       Social Determinants of Health (SDOH) Interventions SDOH Screenings   Food Insecurity: No Food Insecurity (09/11/2023)  Housing: Low Risk  (09/11/2023)  Transportation Needs: No Transportation Needs (09/11/2023)  Utilities: Not At Risk (09/11/2023)  Tobacco Use: Medium Risk (09/12/2023)    Readmission Risk Interventions     No data to display

## 2023-09-16 NOTE — TOC Progression Note (Signed)
Transition of Care [facility]) - Progression Note    Patient Details  Name: Scott Wang MRN: [id number] Date of Birth: 01-20-1941  [facility]) CM/SW Contact  Aslyn Cottman, Olegario Messier, RN Phone Number: 09/16/2023, 2:01 PM  Clinical Narrative: Left vm w/spouse Erskine Squibb await bed choice prior auth.      Expected Discharge Plan: Skilled Nursing Facility Barriers to Discharge: Continued Medical Work up  Expected Discharge Plan and Services   Discharge Planning Services: CM Consult Post Acute Care Choice: Resumption of Svcs/PTA Provider Living arrangements for the past 2 months: Single Family Home                                       Social Determinants of Health (SDOH) Interventions SDOH Screenings   Food Insecurity: No Food Insecurity (09/11/2023)  Housing: Low Risk  (09/11/2023)  Transportation Needs: No Transportation Needs (09/11/2023)  Utilities: Not At Risk (09/11/2023)  Tobacco Use: Medium Risk (09/12/2023)    Readmission Risk Interventions     No data to display

## 2023-09-16 NOTE — Progress Notes (Addendum)
Physical Therapy Treatment Patient Details Name: Scott Wang MRN: [id number] DOB: 1941/08/27 Today's Date: 09/16/2023   History of Present Illness 82 y.o. 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  Pt admitted on 09/10/23 for AKI and UTI    PT Comments  Pt with varying cognition, opens eyes, makes a joke and smiles and then closes eyes and reports 1-2 word responses. Spouse at bedside offering encouragement as appropriate, reports pt is significantly more rigid and weak compared to baseline. Overall, pt with significant decreased initiation, significantly rigid posture, needing +2 assist with bed mobility and transfers. Pt tolerates static standing with max A for ~30 seconds, denies dizziness, needing cues for thoracic/cervical extension due to significant kyphotic stance posture. Pt with LOB in static sitting laterally needing multimodal cues for UE hand placement to assist in supporting trunk in static sitting. Pt transferred to recliner with max A+2, therapist and tech standing closely to pt, minimal LE and UE engagement noted to power up from EOB, assist to weight shift for step pivot to recliner with pt able to clear each foot with step pivot. Pt c/o pain in abdomen and chronic back pain- RN notified. Pt reports intermittent dizziness but improves with time, VSS in supine and sitting. Patient will benefit from continued inpatient follow up therapy, <3 hours/day.    If plan is discharge home, recommend the following: Two people to help with walking and/or transfers;Two people to help with bathing/dressing/bathroom;Assistance with  cooking/housework;Assist for transportation;Help with stairs or ramp for entrance   Can travel by private vehicle     No  Equipment Recommendations  Rolling walker (2 wheels)    Recommendations for Other Services       Precautions / Restrictions Precautions Precautions: Fall Restrictions Weight Bearing Restrictions: No     Mobility  Bed Mobility Overal bed mobility: Needs Assistance Bed Mobility: Supine to Sit, Sit to Supine     Supine to sit: Total assist, +2 for physical assistance Sit to supine: Total assist, +2 for physical assistance   General bed mobility comments: pt attempts to inch LE but muscle activation noted without active motion, total A for LE management and trunk, decreasead intiation    Transfers Overall transfer level: Needs assistance Equipment used: 1 person hand held assist, 2 person hand held assist Transfers: Sit to/from Stand, Bed to chair/wheelchair/BSC Sit to Stand: Max assist   Step pivot transfers: Max assist, +2 physical assistance       General transfer comment: max A for 4 STS reps from EOB with therapist positioned anterior to pt in bear hug position, verbal cues to weight shift to L and upright thoracic/cervical spine into standing as pt standing with significant kyphotic pocture, pt able to clear L foot minimally but unable to step pivot to recliner with +1 assist. Pt completes step pivot with max A+2, needing assist to weight shift laterally to clear each foot for step pivot to drop arm recliner, +2 assist with therapist and tech at each side, pt unable to use RW as therapist needed to be positioned closer- notified RN for STEDY use back to bed    Ambulation/Gait  Stairs             Wheelchair Mobility     Tilt Bed    Modified Rankin (Stroke Patients Only)       Balance Overall balance assessment: History of Falls, Needs assistance Sitting-balance support: Feet supported Sitting  balance-Leahy Scale: Poor Sitting balance - Comments: static sitting pt with LOB laterally needing assist to find upright posture   Standing balance support: During functional activity, Bilateral upper extremity supported Standing balance-Leahy Scale: Zero Standing balance comment: max A for static sitting, strong posterior lean needing multimodal cues to improve                            Cognition Arousal: Alert Behavior During Therapy: Flat affect Overall Cognitive Status: Impaired/Different from baseline                                 General Comments: pt with decreased initiation, minimally verbal reporting 1-2 word responses, spouse at bedside offering encouragement        Exercises      General Comments        Pertinent Vitals/Pain Pain Assessment Pain Assessment: Faces Faces Pain Scale: Hurts even more Pain Location: back and abdomen Pain Descriptors / Indicators: Discomfort, Grimacing, Guarding    Home Living                          Prior Function            PT Goals (current goals can now be found in the care plan section) Progress towards PT goals: Progressing toward goals (very slowly, limited)    Frequency    Min 1X/week      PT Plan      Co-evaluation              AM-PAC PT "6 Clicks" Mobility   Outcome Measure  Help needed turning from your back to your side while in a flat bed without using bedrails?: Total Help needed moving from lying on your back to sitting on the side of a flat bed without using bedrails?: Total Help needed moving to and from a bed to a chair (including a wheelchair)?: Total Help needed standing up from a chair using your arms (e.g., wheelchair or bedside chair)?: Total Help needed to walk in hospital room?: Total Help needed climbing 3-5 steps with a railing? : Total 6 Click Score: 6    End of Session Equipment Utilized During Treatment: Gait belt Activity Tolerance:  Patient limited by fatigue Patient left: in chair;with call bell/phone within reach;with chair alarm set;with family/visitor present Nurse Communication: Mobility status;Patient requests pain meds PT Visit Diagnosis: Other symptoms and signs involving the nervous system (R29.898);Other abnormalities of gait and mobility (R26.89)     Time: 1341-1435 PT Time Calculation (min) (ACUTE ONLY): 54 min  Charges:    $Therapeutic Activity: 53-67 mins PT General Charges $$ ACUTE PT VISIT: 1 Visit                     Tori Rayleen Wyrick PT, DPT 09/16/23, 3:33 PM

## 2023-09-17 DIAGNOSIS — R488 Other symbolic dysfunctions: Secondary | ICD-10-CM | POA: Diagnosis not present

## 2023-09-17 DIAGNOSIS — Z9181 History of falling: Secondary | ICD-10-CM | POA: Diagnosis not present

## 2023-09-17 DIAGNOSIS — Z7401 Bed confinement status: Secondary | ICD-10-CM | POA: Diagnosis not present

## 2023-09-17 DIAGNOSIS — R7881 Bacteremia: Secondary | ICD-10-CM | POA: Diagnosis not present

## 2023-09-17 DIAGNOSIS — R2689 Other abnormalities of gait and mobility: Secondary | ICD-10-CM | POA: Diagnosis not present

## 2023-09-17 DIAGNOSIS — W19XXXA Unspecified fall, initial encounter: Secondary | ICD-10-CM | POA: Diagnosis not present

## 2023-09-17 DIAGNOSIS — R278 Other lack of coordination: Secondary | ICD-10-CM | POA: Diagnosis not present

## 2023-09-17 DIAGNOSIS — M6281 Muscle weakness (generalized): Secondary | ICD-10-CM | POA: Diagnosis not present

## 2023-09-17 DIAGNOSIS — N2889 Other specified disorders of kidney and ureter: Secondary | ICD-10-CM | POA: Diagnosis not present

## 2023-09-17 DIAGNOSIS — N179 Acute kidney failure, unspecified: Secondary | ICD-10-CM | POA: Diagnosis not present

## 2023-09-17 DIAGNOSIS — E119 Type 2 diabetes mellitus without complications: Secondary | ICD-10-CM | POA: Diagnosis not present

## 2023-09-17 DIAGNOSIS — C61 Malignant neoplasm of prostate: Secondary | ICD-10-CM | POA: Diagnosis not present

## 2023-09-17 DIAGNOSIS — G20A1 Parkinson's disease without dyskinesia, without mention of fluctuations: Secondary | ICD-10-CM | POA: Diagnosis not present

## 2023-09-17 DIAGNOSIS — A419 Sepsis, unspecified organism: Secondary | ICD-10-CM | POA: Diagnosis not present

## 2023-09-17 DIAGNOSIS — R471 Dysarthria and anarthria: Secondary | ICD-10-CM | POA: Diagnosis not present

## 2023-09-17 DIAGNOSIS — R1310 Dysphagia, unspecified: Secondary | ICD-10-CM | POA: Diagnosis not present

## 2023-09-17 DIAGNOSIS — Z23 Encounter for immunization: Secondary | ICD-10-CM | POA: Diagnosis not present

## 2023-09-17 DIAGNOSIS — Z7185 Encounter for immunization safety counseling: Secondary | ICD-10-CM | POA: Diagnosis not present

## 2023-09-17 DIAGNOSIS — E222 Syndrome of inappropriate secretion of antidiuretic hormone: Secondary | ICD-10-CM | POA: Diagnosis not present

## 2023-09-17 DIAGNOSIS — R41841 Cognitive communication deficit: Secondary | ICD-10-CM | POA: Diagnosis not present

## 2023-09-17 DIAGNOSIS — R1312 Dysphagia, oropharyngeal phase: Secondary | ICD-10-CM | POA: Diagnosis not present

## 2023-09-17 DIAGNOSIS — N390 Urinary tract infection, site not specified: Secondary | ICD-10-CM | POA: Diagnosis not present

## 2023-09-17 DIAGNOSIS — R531 Weakness: Secondary | ICD-10-CM | POA: Diagnosis not present

## 2023-09-17 LAB — BASIC METABOLIC PANEL
Anion gap: 11 (ref 5–15)
BUN: 48 mg/dL — ABNORMAL HIGH (ref 8–23)
CO2: 20 mmol/L — ABNORMAL LOW (ref 22–32)
Calcium: 8.9 mg/dL (ref 8.9–10.3)
Chloride: 101 mmol/L (ref 98–111)
Creatinine, Ser: 1.8 mg/dL — ABNORMAL HIGH (ref 0.61–1.24)
GFR, Estimated: 37 mL/min — ABNORMAL LOW (ref >60)
Glucose, Bld: 110 mg/dL — ABNORMAL HIGH (ref 70–99)
Potassium: 4.1 mmol/L (ref 3.5–5.1)
Sodium: 132 mmol/L — ABNORMAL LOW (ref 135–145)

## 2023-09-17 MED ORDER — CIPROFLOXACIN HCL 500 MG PO TABS: 500.0000 mg | ORAL_TABLET | ORAL | Status: AC

## 2023-09-17 MED ORDER — HYDROCODONE-ACETAMINOPHEN 5-325 MG PO TABS: 1.0000 | ORAL_TABLET | Freq: Four times a day (QID) | ORAL | 0 refills | Status: DC | PRN

## 2023-09-17 MED ORDER — PANTOPRAZOLE SODIUM 40 MG PO TBEC: 40.0000 mg | DELAYED_RELEASE_TABLET | Freq: Every day | ORAL | 0 refills | Status: AC

## 2023-09-17 MED ORDER — DONEPEZIL HCL 5 MG PO TABS: 5.0000 mg | ORAL_TABLET | Freq: Every day | ORAL | 0 refills | Status: DC

## 2023-09-17 NOTE — NC FL2 (Signed)
[place] MEDICAID FL2 LEVEL OF CARE FORM     IDENTIFICATION  Patient Name: Scott Wang Birthdate: Jan 07, 1941 Sex: male Admission Date (Current Location): 09/10/2023  [facility] and [place] Number:  Producer, television/film/video and Address:  [facility],  [address] [place], [place] [address]      Provider Number: [id number]  Attending Physician Name and Address:  Rhetta Mura, MD  Relative Name and Phone Number:  Olumide Dolinger) [phone]    Current Level of Care: Hospital Recommended Level of Care: Skilled Nursing Facility Prior Approval Number:    Date Approved/Denied:   PASRR Number: [id number]  Discharge Plan: SNF    Current Diagnoses: Patient Active Problem List   Diagnosis Date Noted   AKI (acute kidney injury) (HCC) 09/10/2023   Orthostatic hypotension 11/20/2021   not drinking enough fluids 11/20/2021   Imbalance 11/20/2021   Memory loss 11/20/2021   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   Muscle stiffness due to parkinson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;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   Parkinson's disease (HCC) 12/02/2020   Malignant neoplasm of prostate (HCC) 05/05/2018   Hypertension 06/10/2011   Hyperlipidemia 06/10/2011   Diabetes mellitus (HCC) 06/10/2011   Onychomycosis 06/10/2011   Paronychia 06/10/2011    Orientation RESPIRATION BLADDER Height & Weight     Self, Time, Situation, Place  Normal Continent Weight: 75.9 kg Height:  5\' 7"  (170.2 cm)  BEHAVIORAL SYMPTOMS/MOOD NEUROLOGICAL BOWEL NUTRITION STATUS      Continent Diet (Heart Healthy)  AMBULATORY STATUS COMMUNICATION OF NEEDS Skin   Limited Assist Verbally Normal                       Personal Care Assistance Level of Assistance  Bathing, Feeding, Dressing Bathing Assistance: Limited assistance Feeding assistance: Limited assistance Dressing Assistance: Limited assistance     Functional Limitations Info  Sight, Hearing, Speech Sight  Info: Impaired (readers) Hearing Info: Impaired (Bilateral hearing aids) Speech Info: Adequate    SPECIAL CARE FACTORS FREQUENCY  PT (By licensed PT), OT (By licensed OT)     PT Frequency: 5x week OT Frequency: 5x week            Contractures Contractures Info: Not present    Additional Factors Info  Code Status, Allergies, Psychotropic Code Status Info: Full Allergies Info: Sulfa antibiotics Psychotropic Info: aricept         Current Medications (09/17/2023):  This is the current hospital active medication list Current Facility-Administered Medications  Medication Dose Route Frequency Provider Last Rate Last Admin   acetaminophen (TYLENOL) tablet 650 mg  650 mg Oral Q6H PRN Darlin Drop, DO   650 mg at 09/17/23 0311   alfuzosin (UROXATRAL) 24 hr tablet 10 mg  10 mg Oral Q breakfast Dow Adolph N, DO   10 mg at 09/17/23 0824   alum & mag hydroxide-simeth (MAALOX/MYLANTA) 200-200-20 MG/5ML suspension 15 mL  15 mL Oral Q6H PRN Kathlen Mody, MD   15 mL at 09/15/23 2115   atorvastatin (LIPITOR) tablet 10 mg  10 mg Oral QHS Hall, Carole N, DO   10 mg at 09/16/23 2026   benzonatate (TESSALON) capsule 200 mg  200 mg Oral TID PRN Kathlen Mody, MD   200 mg at 09/14/23 1718   carbidopa-levodopa (SINEMET IR) 25-100 MG per tablet immediate release 1 tablet  1 tablet Oral TID Kathlen Mody, MD   1 tablet at 09/17/23 0824   carvedilol (COREG) tablet  3.125 mg  3.125 mg Oral BID WC Rhetta Mura, MD   3.125 mg at 09/17/23 1610   ciprofloxacin (CIPRO) tablet 500 mg  500 mg Oral Q24H Rhetta Mura, MD   500 mg at 09/16/23 1742   cyanocobalamin (VITAMIN B12) tablet 2,500 mcg  2,500 mcg Oral Daily Dow Adolph N, DO   2,500 mcg at 09/17/23 0824   donepezil (ARICEPT) tablet 5 mg  5 mg Oral QHS Hall, Carole N, DO   5 mg at 09/16/23 2025   enoxaparin (LOVENOX) injection 30 mg  30 mg Subcutaneous Q24H Hall, Carole N, DO   30 mg at 09/17/23 9604   melatonin tablet 5 mg  5 mg Oral  QHS PRN Dow Adolph N, DO   5 mg at 09/16/23 2025   oxyCODONE (Oxy IR/ROXICODONE) immediate release tablet 5 mg  5 mg Oral Q4H PRN Len Childs T, RPH       pantoprazole (PROTONIX) EC tablet 40 mg  40 mg Oral Daily Kathlen Mody, MD   40 mg at 09/17/23 0824   polyethylene glycol (MIRALAX / GLYCOLAX) packet 17 g  17 g Oral Daily PRN Dow Adolph N, DO   17 g at 09/16/23 1114     Discharge Medications: Please see discharge summary for a list of discharge medications.  Relevant Imaging Results:  Relevant Lab Results:   Additional Information [id number] [place], [place]

## 2023-09-17 NOTE — Progress Notes (Signed)
Speech Language Pathology Treatment: Dysphagia  Patient Details Name: Scott Wang MRN: [id number] DOB: 1941-01-23 Today's Date: 09/17/2023 Time: 1205-1225 SLP Time Calculation (min) (ACUTE ONLY): 20 min  Assessment / Plan / Recommendation Clinical Impression  Patient seen to address dysphagia goals.  Lunch had not arrived at this time on therapeutic exercise.  Advised RN to give patient his medications with Ensure due to it being slightly thicker and patient's coughing/choking with liquids on occasion.  Greeted patient with him awake this p.m. after he was sleeping this morning.  Wife in room and patient willing to work with SLP.  Initiated incentive spirometer to help prevent atelectasis with patient benefiting from max cues initially fading to mod for adequate work efforts.  Using teach back SLP explained clinical reasoning.  Goal of approximately 1000 clinically established with patient demonstrating excellent tolerance.  Patient agreeable to conduct this exercise every hour he is awake 10 times prior to meals.    Patient with baseline chronic throat clearing and wife continues to endorse cough but denies associated with p.o. intake.  Observed patient consuming water only as he is on fluid restriction with no clinical indication of aspiration.  Further reviewed aspiration precautions including pH neutral status of water making it safest for him to consume as well as universal sign of choking.  Patient is advised by wife to speak loudly and SLP and he return demonstration.    HPI HPI: Pt is an 82 yo male with h/o Parkinsons (diagnosed approx 4-5 years ago), prostate cancer, prior smoker - quit before 2016, cognitive deficits, HLD, HTN, drooling - s/p Botox 08/2022 - pna in Oct 2023, right hip pain - orthopedic, adm with [facility], decreased po intake.  Pt CXR 09/14/2023 negative for acute findings, suspect ATX, low lung volumes, hazy streaky opacities.      SLP Plan  Continue with current plan of  care      Recommendations for follow up therapy are one component of a multi-disciplinary discharge planning process, led by the attending physician.  Recommendations may be updated based on patient status, additional functional criteria and insurance authorization.    Recommendations  Diet recommendations: Regular;Thin liquid (consider slighlty thicker liquid with meals if decreases his cough) Liquids provided via: Straw Medication Administration:  (with Ensure) Supervision: Intermittent supervision to cue for compensatory strategies Compensations: Slow rate;Small sips/bites;Follow solids with liquid Postural Changes and/or Swallow Maneuvers: Seated upright 90 degrees;Upright 30-60 min after meal                  Oral care BID   Frequent or constant Supervision/Assistance Dysphagia, oropharyngeal phase (R13.12);Dysphagia, unspecified (R13.10)     Continue with current plan of care     Scott Wang  09/17/2023, 12:49 PM

## 2023-09-17 NOTE — TOC Progression Note (Addendum)
Transition of Care [facility]) - Progression Note    Patient Details  Name: Scott Wang MRN: [id number] Date of Birth: Aug 08, 1941  Transition of Care [facility] CM/SW Contact  Christpher Stogsdill, [place], RN Phone Number: 09/17/2023, 10:38 AM  Clinical Narrative: Iline Oven from [facility] till 11/11 auth [id number];ref#[id number] for [address] rep [facility]-will check when bed available. MD updated.   -12p-d/c summary sent. [address] has bed available. Left vm w/spouse about d/c & PTAR to [address]. Going to [address]#[phone],report tel#[phone].PTAR called.No further CM needs.    Expected Discharge Plan: Skilled Nursing Facility Barriers to Discharge: Continued Medical Work up  Expected Discharge Plan and Services   Discharge Planning Services: CM Consult Post Acute Care Choice: Resumption of Svcs/PTA Provider Living arrangements for the past 2 months: Single Family Home                                       Social Determinants of Health (SDOH) Interventions SDOH Screenings   Food Insecurity: No Food Insecurity (09/11/2023)  Housing: Low Risk  (09/11/2023)  Transportation Needs: No Transportation Needs (09/11/2023)  Utilities: Not At Risk (09/11/2023)  Tobacco Use: Medium Risk (09/12/2023)    Readmission Risk Interventions     No data to display

## 2023-09-17 NOTE — Plan of Care (Signed)
  Problem: Education: Goal: Knowledge of General Education information will improve Description: Including pain rating scale, medication(s)/side effects and non-pharmacologic comfort measures Outcome: Progressing   Problem: Health Behavior/Discharge Planning: Goal: Ability to manage health-related needs will improve Outcome: Progressing   Problem: Clinical Measurements: Goal: Ability to maintain clinical measurements within normal limits will improve Outcome: Progressing Goal: Diagnostic test results will improve Outcome: Progressing   Problem: Activity: Goal: Risk for activity intolerance will decrease Outcome: Progressing   Problem: Nutrition: Goal: Adequate nutrition will be maintained Outcome: Progressing   Problem: Elimination: Goal: Will not experience complications related to bowel motility Outcome: Progressing   Problem: Safety: Goal: Ability to remain free from injury will improve Outcome: Progressing   Problem: Clinical Measurements: Goal: Will remain free from infection Outcome: Adequate for Discharge Goal: Respiratory complications will improve Outcome: Adequate for Discharge Goal: Cardiovascular complication will be avoided Outcome: Adequate for Discharge   Problem: Coping: Goal: Level of anxiety will decrease Outcome: Adequate for Discharge   Problem: Pain Management: Goal: General experience of comfort will improve Outcome: Adequate for Discharge   Problem: Skin Integrity: Goal: Risk for impaired skin integrity will decrease Outcome: Adequate for Discharge   Problem: Urinary Elimination: Goal: Signs and symptoms of infection will decrease Outcome: Adequate for Discharge

## 2023-09-17 NOTE — Discharge Summary (Signed)
Physician Discharge Summary  Scott Wang ZSW:[id number] DOB: 24-Jul-1941 DOA: 09/10/2023  PCP: Scott Ferretti, DO  Admit date: 09/10/2023 Discharge date: 09/17/2023  Time spent: 40 minutes  Recommendations for Outpatient Follow-up:  Recommend outpatient CBC Chem-12 in about 1 week Needs close outpatient follow-up with Dr. 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 Ethelene Wang and Dr. Shon Wang Known parkinsonism follows with [facility] neurology on Sinemet and had Botox injections previously Interstitial lung disease follows with Scott Wang 20-pack-year smoker quit in 2010 He also has a right-sided renal mass extending into his right renal sinus without extension outside of the renal capsule?  Renal cell CA followed by urology-Dr. Mena Wang who is aware of the patient and his watchfully waiting Brought into the hospital 09/10/2023 with right flank pain nausea vomiting and chills, and an accidental fall while at home He was hypotensive with a MAP in the 60s started on IV antibiotics white count 17 BUN/creatinine 38/2.9  Hospital Course:  Fall Requiring 2 mod-max assist-at this time for skilled  facility placement-stabilized Will require ongoing therapy in the outpatient setting and will require coordination with his neurologist I will CC them as he may require outpatient PT post rehab stay   Klebsiella bacteremia from UTI and sepsis on admit Rx Ceftriaxone, transition to cipro 500 qd end date 09/19/23 [10 d]--BC x 2 repeat 11/3 ngtd For sepsis physiology resolved no fevers no chills-possibly worsened by renal mass   Renal mass Keep appt for Ct  c Dr. Mena Wang 10/05/23--has OP appt in January--CC [place] at d/c--- if patient is still at facility on date of appointment for CT he will need to be transported to the facility for the same   AKI on admit, NagMa --on admit cre ~3.0, Bicarb19 Continues to improve Cont Sodium bicarb 650 bid--no further Losartan 100 at home--not a good metformin candidate going forward unless cre below 1.3 Reevaluate outpatient labs and make a decision regarding blood pressure control going forward   SIADH-SOsm 285, Uosm >500, and Una 80's He is euvolemic--driven by nausea and prefrecne for fluids [wife says "he complains mouth is dry"] He was restricted temporarily to 1500 cc fluid and recovered well-he has a dry mouth because of all of his Parkinson's meds and he may benefit from swabs at the bedside    Dm ty ii Not eating much ~ 50% and suars all eblow 150 Hold metformin--OP re-eval--- if his sugars are above 200 he may be a candidate for resumption of either metformin or he may be a candidate for SGLT inhibitor instead   Dysphagia Coughing + liquids.  SLP saw on 11/5 and cleared for diet-he is to clear his throat not use straws   Parkinsonism Continue sinemet 1 tab bid, Aricept 5 mg daily   Htn  Cont coreg 3.125 bid --- Titrate upwards if indicated  Discharge Exam: Vitals:   09/16/23 2023 09/17/23 0417  BP: 133/69 (!) 140/73  Pulse: 69 68  Resp: 18 18  Temp: 97.8 F (36.6 C) 98 F (36.7 C)  SpO2: 99% 95%    Subj on day of d/c   He  looks well no distress no pain no fever he is having some difficulty getting a stool  General Exam on discharge  EOMI NCAT no focal deficit flat facies Chest clear no added sound no wheeze no rales no rhonchi Abdomen soft no rebound no guarding Power 5/5   Discharge Instructions    Allergies as of 09/17/2023       Reactions   Sulfa Antibiotics Swelling, Rash        Medication List     STOP taking these medications    losartan 100 MG tablet Commonly known as: COZAAR   metFORMIN 500 MG tablet Commonly known as: GLUCOPHAGE       TAKE these medications    alfuzosin 10 MG 24 hr tablet Commonly known as: UROXATRAL Take 10 mg by mouth daily with breakfast.   atorvastatin 10 MG tablet Commonly known as: LIPITOR Take 10 mg by mouth at bedtime.   carbidopa-levodopa 25-100 MG tablet Commonly known as: SINEMET IR TAKE 1 TABLET BY MOUTH TWICE A DAY What changed:  when to take this additional instructions   carvedilol 3.125 MG tablet Commonly known as: COREG Take 3.125 mg by mouth 2 (two) times daily with a meal.   ciprofloxacin 500 MG tablet Commonly known as: CIPRO Take 1 tablet (500 mg total) by mouth daily for 3 days.   donepezil 5 MG tablet Commonly known as: ARICEPT Take 1 tablet (5 mg total) by mouth at bedtime. What changed: See the new instructions.   HYDROcodone-acetaminophen 5-325 MG tablet Commonly known as: NORCO/VICODIN Take 1 tablet by mouth every 6 (six) hours as needed (for pain).   ONE-A-DAY 50 PLUS PO Take 1 tablet by mouth daily with breakfast.   OneTouch Delica Lancets Fine Misc   pantoprazole 40 MG tablet Commonly known as: PROTONIX Take 1 tablet (40 mg total) by mouth daily. Start taking on: September 18, 2023   TYLENOL 500 MG tablet Generic drug: acetaminophen Take 500 mg by mouth 2 (two) times daily as needed for mild pain (pain score 1-3) or headache.   VITAMIN B-12 PO Take 2,500 mcg by mouth daily.       Allergies   Allergen Reactions   Sulfa Antibiotics Swelling and Rash    Contact information for after-discharge care     Destination     [facility] Preferred SNF .   Service: Skilled Nursing Contact information: [address] [place] [address] [phone]                      The results of significant diagnostics from this hospitalization (including imaging, microbiology, ancillary and laboratory) are listed below for reference.    Significant Diagnostic Studies: DG CHEST PORT 1 VIEW  Result Date: 09/14/2023 CLINICAL DATA:  Cough, pyelonephritis, UTI EXAM: PORTABLE CHEST 1 VIEW COMPARISON:  09/10/2023 chest radiograph. FINDINGS: Low lung volumes. Stable cardiomediastinal silhouette with normal heart size. No pneumothorax. No pleural effusion. No overt pulmonary edema. Mild streaky hazy bibasilar lung opacities, slightly increased. IMPRESSION: Low lung volumes with mild streaky hazy bibasilar lung opacities, slightly increased, favor atelectasis. Electronically Signed   By: Barbara Cower  A Poff M.D.   On: 09/14/2023 21:01   US RENAL  Result Date: 09/11/2023 CLINICAL DATA:  Acute kidney injury EXAM: RENAL / URINARY TRACT ULTRASOUND COMPLETE COMPARISON:  Abdominal MRI 07/02/2023 FINDINGS: Right Kidney: Renal measurements: 9.6 x 5.6 x 4.0 cm = volume: 111 mL. Echogenicity within normal limits. No hydronephrosis. Stealth the right renal lesion at the lower pole which is essentially isoechoic to cortex. Left Kidney: Renal measurements: 9.5 x 5.9 x 4.7 cm = volume: 140 mL. Echogenicity within normal limits. No mass or hydronephrosis visualized. Bladder: Appears normal for degree of bladder distention. IMPRESSION: 1. No acute finding.  No hydronephrosis. 2. Right renal mass much better seen on abdominal MRI 07/02/2023. Electronically Signed   By: Tiburcio Pea M.D.   On: 09/11/2023 07:36   DG Chest Port 1 View  Result Date: 09/10/2023 CLINICAL  DATA:  Weakness. EXAM: PORTABLE CHEST 1 VIEW COMPARISON:  September 10, 2021 FINDINGS: The heart size and mediastinal contours are within normal limits. Mild atelectasis is seen within the bilateral lung bases. There is no evidence of a pleural effusion or pneumothorax. The visualized skeletal structures are unremarkable. IMPRESSION: Mild bibasilar atelectasis. Electronically Signed   By: Aram Candela M.D.   On: 09/10/2023 22:11    Microbiology: Recent Results (from the past 240 hour(s))  Urine Culture (for pregnant, neutropenic or urologic patients or patients with an indwelling urinary catheter)     Status: Abnormal   Collection Time: 09/10/23  9:59 PM   Specimen: Urine, Clean Catch  Result Value Ref Range Status   Specimen Description   Final    URINE, CLEAN CATCH Performed at [facility], [address] [place], [place] [address]    Special Requests   Final    NONE Performed at [facility], [address] [place], [place] [address]    Culture >=100,000 COLONIES/mL KLEBSIELLA OXYTOCA (A)  Final   Report Status 09/13/2023 FINAL  Final   Organism ID, Bacteria KLEBSIELLA OXYTOCA (A)  Final      Susceptibility   Klebsiella oxytoca - MIC*    AMPICILLIN >=32 RESISTANT Resistant     CEFEPIME <=0.12 SENSITIVE Sensitive     CEFTRIAXONE <=0.25 SENSITIVE Sensitive     CIPROFLOXACIN <=0.25 SENSITIVE Sensitive     GENTAMICIN <=1 SENSITIVE Sensitive     IMIPENEM <=0.25 SENSITIVE Sensitive     NITROFURANTOIN 32 SENSITIVE Sensitive     TRIMETH/SULFA <=20 SENSITIVE Sensitive     AMPICILLIN/SULBACTAM 16 INTERMEDIATE Intermediate     PIP/TAZO <=4 SENSITIVE Sensitive ug/mL    * >=100,000 COLONIES/mL KLEBSIELLA OXYTOCA  Blood culture (routine x 2)     Status: Abnormal   Collection Time: 09/10/23 11:00 PM   Specimen: BLOOD RIGHT FOREARM  Result Value Ref Range Status   Specimen Description   Final    BLOOD RIGHT FOREARM Performed at [facility], [address] [place], [place] [address]    Special Requests   Final    BOTTLES DRAWN AEROBIC AND ANAEROBIC Blood Culture adequate volume Performed at [facility], [address] [place], [place] [address]    Culture  Setup Time   Final    GRAM NEGATIVE RODS IN BOTH AEROBIC AND ANAEROBIC BOTTLES CRITICAL RESULT CALLED TO, READ BACK BY AND VERIFIED WITH: PHARMD MARY SWAYNE ON 09/11/23 @ 1825 BY DRT Performed at [facility], [address] [place], [place] [address]    Culture KLEBSIELLA OXYTOCA (A)  Final   Report Status 09/14/2023 FINAL  Final   Organism ID, Bacteria KLEBSIELLA OXYTOCA  Final   Organism ID, Bacteria KLEBSIELLA OXYTOCA  Final      Susceptibility   Klebsiella oxytoca - KIRBY BAUER*    CEFAZOLIN RESISTANT Resistant    Klebsiella oxytoca - MIC*    AMPICILLIN >=32 RESISTANT Resistant     CEFEPIME <=0.12 SENSITIVE Sensitive     CEFTAZIDIME <=1 SENSITIVE Sensitive     CEFTRIAXONE <=0.25 SENSITIVE Sensitive     CIPROFLOXACIN <=0.25 SENSITIVE Sensitive     GENTAMICIN <=1 SENSITIVE Sensitive     IMIPENEM 0.5 SENSITIVE Sensitive     TRIMETH/SULFA <=20 SENSITIVE Sensitive     AMPICILLIN/SULBACTAM 16 INTERMEDIATE Intermediate     PIP/TAZO <=4 SENSITIVE Sensitive ug/mL    * KLEBSIELLA OXYTOCA    KLEBSIELLA OXYTOCA  Blood Culture ID Panel (Reflexed)     Status: Abnormal   Collection Time: 09/10/23 11:00 PM  Result Value Ref Range Status   Enterococcus faecalis NOT DETECTED NOT DETECTED Final   Enterococcus Faecium NOT DETECTED NOT DETECTED Final   Listeria monocytogenes NOT DETECTED NOT DETECTED Final   Staphylococcus species NOT DETECTED NOT DETECTED Final   Staphylococcus aureus (BCID) NOT DETECTED NOT DETECTED Final   Staphylococcus epidermidis NOT DETECTED NOT DETECTED Final   Staphylococcus lugdunensis NOT DETECTED NOT DETECTED Final   Streptococcus species NOT DETECTED NOT DETECTED Final   Streptococcus agalactiae  NOT DETECTED NOT DETECTED Final   Streptococcus pneumoniae NOT DETECTED NOT DETECTED Final   Streptococcus pyogenes NOT DETECTED NOT DETECTED Final   A.calcoaceticus-baumannii NOT DETECTED NOT DETECTED Final   Bacteroides fragilis NOT DETECTED NOT DETECTED Final   Enterobacterales DETECTED (A) NOT DETECTED Final    Comment: Enterobacterales represent a large order of gram negative bacteria, not a single organism. CRITICAL RESULT CALLED TO, READ BACK BY AND VERIFIED WITH: PHARMD MARY SWAYNE ON 09/11/23 @ 1825 BY DRT    Enterobacter cloacae complex NOT DETECTED NOT DETECTED Final   Escherichia coli NOT DETECTED NOT DETECTED Final   Klebsiella aerogenes NOT DETECTED NOT DETECTED Final   Klebsiella oxytoca DETECTED (A) NOT DETECTED Final    Comment: CRITICAL RESULT CALLED TO, READ BACK BY AND VERIFIED WITH: PHARMD MARY SWAYNE ON 09/11/23 @ 1825 BY DRT    Klebsiella pneumoniae NOT DETECTED NOT DETECTED Final   Proteus species NOT DETECTED NOT DETECTED Final   Salmonella species NOT DETECTED NOT DETECTED Final   Serratia marcescens NOT DETECTED NOT DETECTED Final   Haemophilus influenzae NOT DETECTED NOT DETECTED Final   Neisseria meningitidis NOT DETECTED NOT DETECTED Final   Pseudomonas aeruginosa NOT DETECTED NOT DETECTED Final   Stenotrophomonas maltophilia NOT DETECTED NOT DETECTED Final   Candida albicans NOT DETECTED NOT DETECTED Final   Candida auris NOT DETECTED NOT DETECTED Final   Candida glabrata NOT DETECTED NOT DETECTED Final   Candida krusei NOT DETECTED NOT DETECTED Final   Candida parapsilosis NOT DETECTED NOT DETECTED Final   Candida tropicalis NOT DETECTED NOT DETECTED Final   Cryptococcus neoformans/gattii NOT DETECTED NOT DETECTED Final   CTX-M ESBL NOT DETECTED NOT DETECTED Final   Carbapenem resistance IMP NOT DETECTED NOT DETECTED Final   Carbapenem resistance KPC NOT DETECTED NOT DETECTED Final   Carbapenem resistance NDM NOT DETECTED NOT DETECTED Final    Carbapenem resist OXA 48 LIKE NOT DETECTED NOT DETECTED Final   Carbapenem resistance VIM NOT DETECTED NOT DETECTED Final    Comment: Performed at [facility],  [address], [place], [place] [address]  Blood culture (routine x 2)     Status: Abnormal   Collection Time: 09/10/23 11:03 PM   Specimen: BLOOD RIGHT WRIST  Result Value Ref Range Status   Specimen Description   Final    BLOOD RIGHT WRIST Performed at [facility], [address], [place], [place] [address]    Special Requests   Final    BOTTLES DRAWN AEROBIC AND ANAEROBIC Blood Culture adequate volume Performed at [facility], [address], [place], [place] [address]    Culture  Setup Time   Final    GRAM NEGATIVE RODS IN BOTH AEROBIC AND ANAEROBIC BOTTLES CRITICAL VALUE NOTED.  VALUE IS CONSISTENT WITH PREVIOUSLY REPORTED AND CALLED VALUE.    Culture (A)  Final    KLEBSIELLA OXYTOCA SUSCEPTIBILITIES PERFORMED ON PREVIOUS CULTURE WITHIN THE LAST 5 DAYS. Performed at [facility], [address], [place], [place] [address]    Report Status 09/16/2023 FINAL  Final  Culture, blood (Routine X 2) w Reflex to ID Panel     Status: None (Preliminary result)   Collection Time: 09/13/23  5:25 AM   Specimen: BLOOD  Result Value Ref Range Status   Specimen Description   Final    BLOOD BLOOD LEFT ARM Performed at [facility], [address], [place], [place] [address]    Special Requests   Final    BOTTLES DRAWN AEROBIC AND ANAEROBIC Blood Culture adequate volume Performed at [facility], [address], [place], [place] [address]    Culture   Final    NO GROWTH 4 DAYS Performed at [facility], [address], [place], [place] [address]    Report Status PENDING  Incomplete  Culture, blood (Routine X 2) w Reflex to ID Panel     Status: None (Preliminary result)   Collection Time: 09/13/23  5:32 AM   Specimen: BLOOD  Result  Value Ref Range Status   Specimen Description   Final    BLOOD BLOOD LEFT ARM Performed at [facility], [address], [place], [place] [address]    Special Requests   Final    BOTTLES DRAWN AEROBIC ONLY Blood Culture adequate volume Performed at [facility], [address] [place], [place] [address]    Culture   Final    NO GROWTH 4 DAYS Performed at [facility], [address], [place], [place] [address]    Report Status PENDING  Incomplete     Labs: Basic Metabolic Panel: Recent Labs  Lab 09/11/23 0533 09/12/23 0520 09/13/23 0532 09/15/23 0454 09/17/23 0516  NA 134* 129* 130* 129* 132*  K 4.5 4.3 3.9 4.0 4.1  CL 102 101 104 99 101  CO2 19* 18* 18* 20* 20*  GLUCOSE 101* 102* 113* 112* 110*  BUN 46* 52* 58* 57* 48*  CREATININE 3.01* 2.85* 2.48* 2.07* 1.80*  CALCIUM 9.1 9.0 8.8* 8.9 8.9  PHOS 3.8 3.3  --   --   --    Liver Function Tests: Recent Labs  Lab 09/10/23 1947 09/11/23 0533 09/12/23 0520  AST 26  --   --   ALT 19  --   --   ALKPHOS 34*  --   --   BILITOT 1.2  --   --   PROT 5.7*  --   --   ALBUMIN 3.0* 2.9* 2.6*   No results  for input(s): "LIPASE", "AMYLASE" in the last 168 hours. No results for input(s): "AMMONIA" in the last 168 hours. CBC: Recent Labs  Lab 09/10/23 1947 09/11/23 0533 09/13/23 0532 09/15/23 0454  WBC 17.9* 15.3* 11.9* 9.6  NEUTROABS 15.9*  --  10.1* 6.7  HGB 11.3* 11.4* 10.7* 11.0*  HCT 34.2* 35.5* 31.6* 32.6*  MCV 94.5 96.2 91.6 90.6  PLT 147* 147* 162 185   Cardiac Enzymes: No results for input(s): "CKTOTAL", "CKMB", "CKMBINDEX", "TROPONINI" in the last 168 hours. BNP: BNP (last 3 results) No results for input(s): "BNP" in the last 8760 hours.  ProBNP (last 3 results) No results for input(s): "PROBNP" in the last 8760 hours.  CBG: No results for input(s): "GLUCAP" in the last 168 hours.     Signed:  Rhetta Mura MD   [facility] 09/17/2023,  10:49 AM

## 2023-09-17 NOTE — Progress Notes (Signed)
Called [facility] room [phone] nurse at [phone] and gave report.

## 2023-09-18 DIAGNOSIS — C61 Malignant neoplasm of prostate: Secondary | ICD-10-CM | POA: Diagnosis not present

## 2023-09-18 DIAGNOSIS — Z9181 History of falling: Secondary | ICD-10-CM | POA: Diagnosis not present

## 2023-09-18 DIAGNOSIS — E222 Syndrome of inappropriate secretion of antidiuretic hormone: Secondary | ICD-10-CM | POA: Diagnosis not present

## 2023-09-18 DIAGNOSIS — E119 Type 2 diabetes mellitus without complications: Secondary | ICD-10-CM | POA: Diagnosis not present

## 2023-09-18 DIAGNOSIS — G20A1 Parkinson's disease without dyskinesia, without mention of fluctuations: Secondary | ICD-10-CM | POA: Diagnosis not present

## 2023-09-18 DIAGNOSIS — R7881 Bacteremia: Secondary | ICD-10-CM | POA: Diagnosis not present

## 2023-09-18 DIAGNOSIS — W19XXXA Unspecified fall, initial encounter: Secondary | ICD-10-CM | POA: Diagnosis not present

## 2023-09-18 DIAGNOSIS — N2889 Other specified disorders of kidney and ureter: Secondary | ICD-10-CM | POA: Diagnosis not present

## 2023-09-18 DIAGNOSIS — R278 Other lack of coordination: Secondary | ICD-10-CM | POA: Diagnosis not present

## 2023-09-18 DIAGNOSIS — N390 Urinary tract infection, site not specified: Secondary | ICD-10-CM | POA: Diagnosis not present

## 2023-09-18 DIAGNOSIS — R1312 Dysphagia, oropharyngeal phase: Secondary | ICD-10-CM | POA: Diagnosis not present

## 2023-09-18 DIAGNOSIS — M6281 Muscle weakness (generalized): Secondary | ICD-10-CM | POA: Diagnosis not present

## 2023-09-18 LAB — CULTURE, BLOOD (ROUTINE X 2)
Culture: NO GROWTH
Culture: NO GROWTH
Special Requests: ADEQUATE
Special Requests: ADEQUATE

## 2023-09-22 DIAGNOSIS — R1312 Dysphagia, oropharyngeal phase: Secondary | ICD-10-CM | POA: Diagnosis not present

## 2023-09-22 DIAGNOSIS — E222 Syndrome of inappropriate secretion of antidiuretic hormone: Secondary | ICD-10-CM | POA: Diagnosis not present

## 2023-09-22 DIAGNOSIS — C61 Malignant neoplasm of prostate: Secondary | ICD-10-CM | POA: Diagnosis not present

## 2023-09-22 DIAGNOSIS — R278 Other lack of coordination: Secondary | ICD-10-CM | POA: Diagnosis not present

## 2023-09-22 DIAGNOSIS — N390 Urinary tract infection, site not specified: Secondary | ICD-10-CM | POA: Diagnosis not present

## 2023-09-22 DIAGNOSIS — M6281 Muscle weakness (generalized): Secondary | ICD-10-CM | POA: Diagnosis not present

## 2023-09-22 DIAGNOSIS — E119 Type 2 diabetes mellitus without complications: Secondary | ICD-10-CM | POA: Diagnosis not present

## 2023-09-22 DIAGNOSIS — N2889 Other specified disorders of kidney and ureter: Secondary | ICD-10-CM | POA: Diagnosis not present

## 2023-09-22 DIAGNOSIS — G20A1 Parkinson's disease without dyskinesia, without mention of fluctuations: Secondary | ICD-10-CM | POA: Diagnosis not present

## 2023-09-22 DIAGNOSIS — R7881 Bacteremia: Secondary | ICD-10-CM | POA: Diagnosis not present

## 2023-09-22 DIAGNOSIS — W19XXXA Unspecified fall, initial encounter: Secondary | ICD-10-CM | POA: Diagnosis not present

## 2023-09-22 DIAGNOSIS — Z9181 History of falling: Secondary | ICD-10-CM | POA: Diagnosis not present

## 2023-09-23 DIAGNOSIS — N2889 Other specified disorders of kidney and ureter: Secondary | ICD-10-CM | POA: Diagnosis not present

## 2023-09-23 DIAGNOSIS — R1312 Dysphagia, oropharyngeal phase: Secondary | ICD-10-CM | POA: Diagnosis not present

## 2023-09-23 DIAGNOSIS — W19XXXA Unspecified fall, initial encounter: Secondary | ICD-10-CM | POA: Diagnosis not present

## 2023-09-23 DIAGNOSIS — E222 Syndrome of inappropriate secretion of antidiuretic hormone: Secondary | ICD-10-CM | POA: Diagnosis not present

## 2023-09-23 DIAGNOSIS — C61 Malignant neoplasm of prostate: Secondary | ICD-10-CM | POA: Diagnosis not present

## 2023-09-23 DIAGNOSIS — N390 Urinary tract infection, site not specified: Secondary | ICD-10-CM | POA: Diagnosis not present

## 2023-09-23 DIAGNOSIS — E119 Type 2 diabetes mellitus without complications: Secondary | ICD-10-CM | POA: Diagnosis not present

## 2023-09-23 DIAGNOSIS — R7881 Bacteremia: Secondary | ICD-10-CM | POA: Diagnosis not present

## 2023-09-23 DIAGNOSIS — G20A1 Parkinson's disease without dyskinesia, without mention of fluctuations: Secondary | ICD-10-CM | POA: Diagnosis not present

## 2023-09-25 DIAGNOSIS — N390 Urinary tract infection, site not specified: Secondary | ICD-10-CM | POA: Diagnosis not present

## 2023-09-25 DIAGNOSIS — E222 Syndrome of inappropriate secretion of antidiuretic hormone: Secondary | ICD-10-CM | POA: Diagnosis not present

## 2023-09-25 DIAGNOSIS — M6281 Muscle weakness (generalized): Secondary | ICD-10-CM | POA: Diagnosis not present

## 2023-09-25 DIAGNOSIS — C61 Malignant neoplasm of prostate: Secondary | ICD-10-CM | POA: Diagnosis not present

## 2023-09-25 DIAGNOSIS — W19XXXA Unspecified fall, initial encounter: Secondary | ICD-10-CM | POA: Diagnosis not present

## 2023-09-25 DIAGNOSIS — G20A1 Parkinson's disease without dyskinesia, without mention of fluctuations: Secondary | ICD-10-CM | POA: Diagnosis not present

## 2023-09-25 DIAGNOSIS — E119 Type 2 diabetes mellitus without complications: Secondary | ICD-10-CM | POA: Diagnosis not present

## 2023-09-25 DIAGNOSIS — R7881 Bacteremia: Secondary | ICD-10-CM | POA: Diagnosis not present

## 2023-09-25 DIAGNOSIS — R1312 Dysphagia, oropharyngeal phase: Secondary | ICD-10-CM | POA: Diagnosis not present

## 2023-09-25 DIAGNOSIS — N2889 Other specified disorders of kidney and ureter: Secondary | ICD-10-CM | POA: Diagnosis not present

## 2023-09-25 DIAGNOSIS — R278 Other lack of coordination: Secondary | ICD-10-CM | POA: Diagnosis not present

## 2023-09-25 DIAGNOSIS — Z9181 History of falling: Secondary | ICD-10-CM | POA: Diagnosis not present

## 2023-09-28 DIAGNOSIS — E119 Type 2 diabetes mellitus without complications: Secondary | ICD-10-CM | POA: Diagnosis not present

## 2023-09-28 DIAGNOSIS — W19XXXA Unspecified fall, initial encounter: Secondary | ICD-10-CM | POA: Diagnosis not present

## 2023-09-28 DIAGNOSIS — G20A1 Parkinson's disease without dyskinesia, without mention of fluctuations: Secondary | ICD-10-CM | POA: Diagnosis not present

## 2023-09-28 DIAGNOSIS — C61 Malignant neoplasm of prostate: Secondary | ICD-10-CM | POA: Diagnosis not present

## 2023-09-28 DIAGNOSIS — R1312 Dysphagia, oropharyngeal phase: Secondary | ICD-10-CM | POA: Diagnosis not present

## 2023-09-28 DIAGNOSIS — E222 Syndrome of inappropriate secretion of antidiuretic hormone: Secondary | ICD-10-CM | POA: Diagnosis not present

## 2023-09-28 DIAGNOSIS — N2889 Other specified disorders of kidney and ureter: Secondary | ICD-10-CM | POA: Diagnosis not present

## 2023-09-28 DIAGNOSIS — N390 Urinary tract infection, site not specified: Secondary | ICD-10-CM | POA: Diagnosis not present

## 2023-09-28 DIAGNOSIS — R7881 Bacteremia: Secondary | ICD-10-CM | POA: Diagnosis not present

## 2023-09-29 DIAGNOSIS — Z9181 History of falling: Secondary | ICD-10-CM | POA: Diagnosis not present

## 2023-09-29 DIAGNOSIS — G20A1 Parkinson's disease without dyskinesia, without mention of fluctuations: Secondary | ICD-10-CM | POA: Diagnosis not present

## 2023-09-29 DIAGNOSIS — M6281 Muscle weakness (generalized): Secondary | ICD-10-CM | POA: Diagnosis not present

## 2023-09-29 DIAGNOSIS — R278 Other lack of coordination: Secondary | ICD-10-CM | POA: Diagnosis not present

## 2023-09-29 DIAGNOSIS — N390 Urinary tract infection, site not specified: Secondary | ICD-10-CM | POA: Diagnosis not present

## 2023-09-30 DIAGNOSIS — E222 Syndrome of inappropriate secretion of antidiuretic hormone: Secondary | ICD-10-CM | POA: Diagnosis not present

## 2023-09-30 DIAGNOSIS — G20A1 Parkinson's disease without dyskinesia, without mention of fluctuations: Secondary | ICD-10-CM | POA: Diagnosis not present

## 2023-09-30 DIAGNOSIS — R1312 Dysphagia, oropharyngeal phase: Secondary | ICD-10-CM | POA: Diagnosis not present

## 2023-09-30 DIAGNOSIS — N390 Urinary tract infection, site not specified: Secondary | ICD-10-CM | POA: Diagnosis not present

## 2023-09-30 DIAGNOSIS — R7881 Bacteremia: Secondary | ICD-10-CM | POA: Diagnosis not present

## 2023-09-30 DIAGNOSIS — C61 Malignant neoplasm of prostate: Secondary | ICD-10-CM | POA: Diagnosis not present

## 2023-09-30 DIAGNOSIS — E119 Type 2 diabetes mellitus without complications: Secondary | ICD-10-CM | POA: Diagnosis not present

## 2023-09-30 DIAGNOSIS — N2889 Other specified disorders of kidney and ureter: Secondary | ICD-10-CM | POA: Diagnosis not present

## 2023-09-30 DIAGNOSIS — W19XXXA Unspecified fall, initial encounter: Secondary | ICD-10-CM | POA: Diagnosis not present

## 2023-10-02 ENCOUNTER — Other Ambulatory Visit: Payer: Self-pay | Admitting: Neurology

## 2023-10-02 DIAGNOSIS — W19XXXA Unspecified fall, initial encounter: Secondary | ICD-10-CM | POA: Diagnosis not present

## 2023-10-02 DIAGNOSIS — C61 Malignant neoplasm of prostate: Secondary | ICD-10-CM | POA: Diagnosis not present

## 2023-10-02 DIAGNOSIS — E119 Type 2 diabetes mellitus without complications: Secondary | ICD-10-CM | POA: Diagnosis not present

## 2023-10-02 DIAGNOSIS — G20A1 Parkinson's disease without dyskinesia, without mention of fluctuations: Secondary | ICD-10-CM | POA: Diagnosis not present

## 2023-10-02 DIAGNOSIS — N2889 Other specified disorders of kidney and ureter: Secondary | ICD-10-CM | POA: Diagnosis not present

## 2023-10-02 DIAGNOSIS — E222 Syndrome of inappropriate secretion of antidiuretic hormone: Secondary | ICD-10-CM | POA: Diagnosis not present

## 2023-10-02 DIAGNOSIS — R1312 Dysphagia, oropharyngeal phase: Secondary | ICD-10-CM | POA: Diagnosis not present

## 2023-10-02 DIAGNOSIS — R7881 Bacteremia: Secondary | ICD-10-CM | POA: Diagnosis not present

## 2023-10-02 DIAGNOSIS — N390 Urinary tract infection, site not specified: Secondary | ICD-10-CM | POA: Diagnosis not present

## 2023-10-03 DIAGNOSIS — I10 Essential (primary) hypertension: Secondary | ICD-10-CM | POA: Diagnosis not present

## 2023-10-03 DIAGNOSIS — E119 Type 2 diabetes mellitus without complications: Secondary | ICD-10-CM | POA: Diagnosis not present

## 2023-10-03 DIAGNOSIS — N179 Acute kidney failure, unspecified: Secondary | ICD-10-CM | POA: Diagnosis not present

## 2023-10-03 DIAGNOSIS — E222 Syndrome of inappropriate secretion of antidiuretic hormone: Secondary | ICD-10-CM | POA: Diagnosis not present

## 2023-10-03 DIAGNOSIS — R1312 Dysphagia, oropharyngeal phase: Secondary | ICD-10-CM | POA: Diagnosis not present

## 2023-10-03 DIAGNOSIS — J849 Interstitial pulmonary disease, unspecified: Secondary | ICD-10-CM | POA: Diagnosis not present

## 2023-10-03 DIAGNOSIS — C641 Malignant neoplasm of right kidney, except renal pelvis: Secondary | ICD-10-CM | POA: Diagnosis not present

## 2023-10-03 DIAGNOSIS — A419 Sepsis, unspecified organism: Secondary | ICD-10-CM | POA: Diagnosis not present

## 2023-10-03 DIAGNOSIS — G20A1 Parkinson's disease without dyskinesia, without mention of fluctuations: Secondary | ICD-10-CM | POA: Diagnosis not present

## 2023-10-05 ENCOUNTER — Telehealth: Payer: Self-pay | Admitting: Neurology

## 2023-10-05 DIAGNOSIS — R310 Gross hematuria: Secondary | ICD-10-CM | POA: Diagnosis not present

## 2023-10-05 DIAGNOSIS — K429 Umbilical hernia without obstruction or gangrene: Secondary | ICD-10-CM | POA: Diagnosis not present

## 2023-10-05 DIAGNOSIS — K5730 Diverticulosis of large intestine without perforation or abscess without bleeding: Secondary | ICD-10-CM | POA: Diagnosis not present

## 2023-10-05 NOTE — Telephone Encounter (Signed)
Received voicemail for patient from wife stated patient did Botox round a few months ago for the drooling and is wanting to repeat Botox to see if it would help again please call back [phone] or [phone].

## 2023-10-05 NOTE — Telephone Encounter (Signed)
If he is authorized, then may schedule Botox inj appt for sialorrhea.

## 2023-10-05 NOTE — Telephone Encounter (Signed)
Verified that pt does have auth on file thru 11/10/23. There are no openings before the end of the year at this time. I submitted auth renewal via CMM, status is pending. Key: [id number]

## 2023-10-06 ENCOUNTER — Telehealth: Payer: Self-pay | Admitting: Neurology

## 2023-10-06 DIAGNOSIS — N179 Acute kidney failure, unspecified: Secondary | ICD-10-CM | POA: Diagnosis not present

## 2023-10-06 DIAGNOSIS — E222 Syndrome of inappropriate secretion of antidiuretic hormone: Secondary | ICD-10-CM | POA: Diagnosis not present

## 2023-10-06 DIAGNOSIS — R278 Other lack of coordination: Secondary | ICD-10-CM | POA: Diagnosis not present

## 2023-10-06 DIAGNOSIS — G20C Parkinsonism, unspecified: Secondary | ICD-10-CM | POA: Diagnosis not present

## 2023-10-06 DIAGNOSIS — A419 Sepsis, unspecified organism: Secondary | ICD-10-CM | POA: Diagnosis not present

## 2023-10-06 DIAGNOSIS — M6281 Muscle weakness (generalized): Secondary | ICD-10-CM | POA: Diagnosis not present

## 2023-10-06 DIAGNOSIS — R1310 Dysphagia, unspecified: Secondary | ICD-10-CM | POA: Diagnosis not present

## 2023-10-06 NOTE — Telephone Encounter (Signed)
Called pt and got him scheduled for 01/20/24 @ 10:45 am. I offered a sooner appointment in January but he declined because 7:45 am was too early in the morning for him.

## 2023-10-06 NOTE — Telephone Encounter (Signed)
Pt called to schedule an appt as instructed by. Rehab to be able for them to have his medications refilled.

## 2023-10-06 NOTE — Telephone Encounter (Signed)
Received approval from [facility], pt will continue to be buy/bill. Auth#: [id number] (12/05/21-11/09/24)

## 2023-10-13 DIAGNOSIS — A419 Sepsis, unspecified organism: Secondary | ICD-10-CM | POA: Diagnosis not present

## 2023-10-13 DIAGNOSIS — E119 Type 2 diabetes mellitus without complications: Secondary | ICD-10-CM | POA: Diagnosis not present

## 2023-10-13 DIAGNOSIS — C641 Malignant neoplasm of right kidney, except renal pelvis: Secondary | ICD-10-CM | POA: Diagnosis not present

## 2023-10-13 DIAGNOSIS — N179 Acute kidney failure, unspecified: Secondary | ICD-10-CM | POA: Diagnosis not present

## 2023-10-13 DIAGNOSIS — E222 Syndrome of inappropriate secretion of antidiuretic hormone: Secondary | ICD-10-CM | POA: Diagnosis not present

## 2023-10-13 DIAGNOSIS — G20A1 Parkinson's disease without dyskinesia, without mention of fluctuations: Secondary | ICD-10-CM | POA: Diagnosis not present

## 2023-10-13 DIAGNOSIS — I10 Essential (primary) hypertension: Secondary | ICD-10-CM | POA: Diagnosis not present

## 2023-10-13 DIAGNOSIS — J849 Interstitial pulmonary disease, unspecified: Secondary | ICD-10-CM | POA: Diagnosis not present

## 2023-10-13 DIAGNOSIS — R1312 Dysphagia, oropharyngeal phase: Secondary | ICD-10-CM | POA: Diagnosis not present

## 2023-10-15 DIAGNOSIS — N1832 Chronic kidney disease, stage 3b: Secondary | ICD-10-CM | POA: Diagnosis not present

## 2023-10-15 DIAGNOSIS — R2681 Unsteadiness on feet: Secondary | ICD-10-CM | POA: Diagnosis not present

## 2023-10-15 DIAGNOSIS — J849 Interstitial pulmonary disease, unspecified: Secondary | ICD-10-CM | POA: Diagnosis not present

## 2023-10-15 DIAGNOSIS — E1169 Type 2 diabetes mellitus with other specified complication: Secondary | ICD-10-CM | POA: Diagnosis not present

## 2023-10-15 DIAGNOSIS — E871 Hypo-osmolality and hyponatremia: Secondary | ICD-10-CM | POA: Diagnosis not present

## 2023-10-15 DIAGNOSIS — R600 Localized edema: Secondary | ICD-10-CM | POA: Diagnosis not present

## 2023-10-15 DIAGNOSIS — J439 Emphysema, unspecified: Secondary | ICD-10-CM | POA: Diagnosis not present

## 2023-10-15 DIAGNOSIS — M4800 Spinal stenosis, site unspecified: Secondary | ICD-10-CM | POA: Diagnosis not present

## 2023-10-15 DIAGNOSIS — R053 Chronic cough: Secondary | ICD-10-CM | POA: Diagnosis not present

## 2023-10-15 DIAGNOSIS — G20A1 Parkinson's disease without dyskinesia, without mention of fluctuations: Secondary | ICD-10-CM | POA: Diagnosis not present

## 2023-10-15 DIAGNOSIS — I10 Essential (primary) hypertension: Secondary | ICD-10-CM | POA: Diagnosis not present

## 2023-10-15 DIAGNOSIS — E785 Hyperlipidemia, unspecified: Secondary | ICD-10-CM | POA: Diagnosis not present

## 2023-10-15 DIAGNOSIS — I700 Atherosclerosis of aorta: Secondary | ICD-10-CM | POA: Diagnosis not present

## 2023-10-15 DIAGNOSIS — E441 Mild protein-calorie malnutrition: Secondary | ICD-10-CM | POA: Diagnosis not present

## 2023-10-16 DIAGNOSIS — J849 Interstitial pulmonary disease, unspecified: Secondary | ICD-10-CM | POA: Diagnosis not present

## 2023-10-16 DIAGNOSIS — A419 Sepsis, unspecified organism: Secondary | ICD-10-CM | POA: Diagnosis not present

## 2023-10-16 DIAGNOSIS — C641 Malignant neoplasm of right kidney, except renal pelvis: Secondary | ICD-10-CM | POA: Diagnosis not present

## 2023-10-16 DIAGNOSIS — R1312 Dysphagia, oropharyngeal phase: Secondary | ICD-10-CM | POA: Diagnosis not present

## 2023-10-16 DIAGNOSIS — E222 Syndrome of inappropriate secretion of antidiuretic hormone: Secondary | ICD-10-CM | POA: Diagnosis not present

## 2023-10-16 DIAGNOSIS — E119 Type 2 diabetes mellitus without complications: Secondary | ICD-10-CM | POA: Diagnosis not present

## 2023-10-16 DIAGNOSIS — G20A1 Parkinson's disease without dyskinesia, without mention of fluctuations: Secondary | ICD-10-CM | POA: Diagnosis not present

## 2023-10-16 DIAGNOSIS — I10 Essential (primary) hypertension: Secondary | ICD-10-CM | POA: Diagnosis not present

## 2023-10-16 DIAGNOSIS — N179 Acute kidney failure, unspecified: Secondary | ICD-10-CM | POA: Diagnosis not present

## 2023-10-20 DIAGNOSIS — R1312 Dysphagia, oropharyngeal phase: Secondary | ICD-10-CM | POA: Diagnosis not present

## 2023-10-20 DIAGNOSIS — G20A1 Parkinson's disease without dyskinesia, without mention of fluctuations: Secondary | ICD-10-CM | POA: Diagnosis not present

## 2023-10-20 DIAGNOSIS — E222 Syndrome of inappropriate secretion of antidiuretic hormone: Secondary | ICD-10-CM | POA: Diagnosis not present

## 2023-10-20 DIAGNOSIS — C641 Malignant neoplasm of right kidney, except renal pelvis: Secondary | ICD-10-CM | POA: Diagnosis not present

## 2023-10-20 DIAGNOSIS — N179 Acute kidney failure, unspecified: Secondary | ICD-10-CM | POA: Diagnosis not present

## 2023-10-20 DIAGNOSIS — J849 Interstitial pulmonary disease, unspecified: Secondary | ICD-10-CM | POA: Diagnosis not present

## 2023-10-20 DIAGNOSIS — I10 Essential (primary) hypertension: Secondary | ICD-10-CM | POA: Diagnosis not present

## 2023-10-20 DIAGNOSIS — A419 Sepsis, unspecified organism: Secondary | ICD-10-CM | POA: Diagnosis not present

## 2023-10-20 DIAGNOSIS — E119 Type 2 diabetes mellitus without complications: Secondary | ICD-10-CM | POA: Diagnosis not present

## 2023-10-22 DIAGNOSIS — R1312 Dysphagia, oropharyngeal phase: Secondary | ICD-10-CM | POA: Diagnosis not present

## 2023-10-22 DIAGNOSIS — J849 Interstitial pulmonary disease, unspecified: Secondary | ICD-10-CM | POA: Diagnosis not present

## 2023-10-22 DIAGNOSIS — N179 Acute kidney failure, unspecified: Secondary | ICD-10-CM | POA: Diagnosis not present

## 2023-10-22 DIAGNOSIS — E222 Syndrome of inappropriate secretion of antidiuretic hormone: Secondary | ICD-10-CM | POA: Diagnosis not present

## 2023-10-22 DIAGNOSIS — C641 Malignant neoplasm of right kidney, except renal pelvis: Secondary | ICD-10-CM | POA: Diagnosis not present

## 2023-10-22 DIAGNOSIS — A419 Sepsis, unspecified organism: Secondary | ICD-10-CM | POA: Diagnosis not present

## 2023-10-22 DIAGNOSIS — E119 Type 2 diabetes mellitus without complications: Secondary | ICD-10-CM | POA: Diagnosis not present

## 2023-10-22 DIAGNOSIS — G20A1 Parkinson's disease without dyskinesia, without mention of fluctuations: Secondary | ICD-10-CM | POA: Diagnosis not present

## 2023-10-22 DIAGNOSIS — I10 Essential (primary) hypertension: Secondary | ICD-10-CM | POA: Diagnosis not present

## 2023-10-27 DIAGNOSIS — E222 Syndrome of inappropriate secretion of antidiuretic hormone: Secondary | ICD-10-CM | POA: Diagnosis not present

## 2023-10-27 DIAGNOSIS — R1312 Dysphagia, oropharyngeal phase: Secondary | ICD-10-CM | POA: Diagnosis not present

## 2023-10-27 DIAGNOSIS — J849 Interstitial pulmonary disease, unspecified: Secondary | ICD-10-CM | POA: Diagnosis not present

## 2023-10-27 DIAGNOSIS — N179 Acute kidney failure, unspecified: Secondary | ICD-10-CM | POA: Diagnosis not present

## 2023-10-27 DIAGNOSIS — C641 Malignant neoplasm of right kidney, except renal pelvis: Secondary | ICD-10-CM | POA: Diagnosis not present

## 2023-10-27 DIAGNOSIS — I10 Essential (primary) hypertension: Secondary | ICD-10-CM | POA: Diagnosis not present

## 2023-10-27 DIAGNOSIS — A419 Sepsis, unspecified organism: Secondary | ICD-10-CM | POA: Diagnosis not present

## 2023-10-27 DIAGNOSIS — G20A1 Parkinson's disease without dyskinesia, without mention of fluctuations: Secondary | ICD-10-CM | POA: Diagnosis not present

## 2023-10-27 DIAGNOSIS — E119 Type 2 diabetes mellitus without complications: Secondary | ICD-10-CM | POA: Diagnosis not present

## 2023-10-29 DIAGNOSIS — C641 Malignant neoplasm of right kidney, except renal pelvis: Secondary | ICD-10-CM | POA: Diagnosis not present

## 2023-10-29 DIAGNOSIS — I10 Essential (primary) hypertension: Secondary | ICD-10-CM | POA: Diagnosis not present

## 2023-10-29 DIAGNOSIS — E222 Syndrome of inappropriate secretion of antidiuretic hormone: Secondary | ICD-10-CM | POA: Diagnosis not present

## 2023-10-29 DIAGNOSIS — R1312 Dysphagia, oropharyngeal phase: Secondary | ICD-10-CM | POA: Diagnosis not present

## 2023-10-29 DIAGNOSIS — J849 Interstitial pulmonary disease, unspecified: Secondary | ICD-10-CM | POA: Diagnosis not present

## 2023-10-29 DIAGNOSIS — N179 Acute kidney failure, unspecified: Secondary | ICD-10-CM | POA: Diagnosis not present

## 2023-10-29 DIAGNOSIS — G20A1 Parkinson's disease without dyskinesia, without mention of fluctuations: Secondary | ICD-10-CM | POA: Diagnosis not present

## 2023-10-29 DIAGNOSIS — A419 Sepsis, unspecified organism: Secondary | ICD-10-CM | POA: Diagnosis not present

## 2023-10-29 DIAGNOSIS — E119 Type 2 diabetes mellitus without complications: Secondary | ICD-10-CM | POA: Diagnosis not present

## 2023-11-02 DIAGNOSIS — E119 Type 2 diabetes mellitus without complications: Secondary | ICD-10-CM | POA: Diagnosis not present

## 2023-11-02 DIAGNOSIS — E222 Syndrome of inappropriate secretion of antidiuretic hormone: Secondary | ICD-10-CM | POA: Diagnosis not present

## 2023-11-02 DIAGNOSIS — J849 Interstitial pulmonary disease, unspecified: Secondary | ICD-10-CM | POA: Diagnosis not present

## 2023-11-02 DIAGNOSIS — N179 Acute kidney failure, unspecified: Secondary | ICD-10-CM | POA: Diagnosis not present

## 2023-11-02 DIAGNOSIS — G20A1 Parkinson's disease without dyskinesia, without mention of fluctuations: Secondary | ICD-10-CM | POA: Diagnosis not present

## 2023-11-02 DIAGNOSIS — A419 Sepsis, unspecified organism: Secondary | ICD-10-CM | POA: Diagnosis not present

## 2023-11-02 DIAGNOSIS — C641 Malignant neoplasm of right kidney, except renal pelvis: Secondary | ICD-10-CM | POA: Diagnosis not present

## 2023-11-02 DIAGNOSIS — R1312 Dysphagia, oropharyngeal phase: Secondary | ICD-10-CM | POA: Diagnosis not present

## 2023-11-02 DIAGNOSIS — I10 Essential (primary) hypertension: Secondary | ICD-10-CM | POA: Diagnosis not present

## 2023-11-05 DIAGNOSIS — G20C Parkinsonism, unspecified: Secondary | ICD-10-CM | POA: Diagnosis not present

## 2023-11-05 DIAGNOSIS — R1310 Dysphagia, unspecified: Secondary | ICD-10-CM | POA: Diagnosis not present

## 2023-11-05 DIAGNOSIS — E222 Syndrome of inappropriate secretion of antidiuretic hormone: Secondary | ICD-10-CM | POA: Diagnosis not present

## 2023-11-05 DIAGNOSIS — M6281 Muscle weakness (generalized): Secondary | ICD-10-CM | POA: Diagnosis not present

## 2023-11-05 DIAGNOSIS — R278 Other lack of coordination: Secondary | ICD-10-CM | POA: Diagnosis not present

## 2023-11-05 DIAGNOSIS — N179 Acute kidney failure, unspecified: Secondary | ICD-10-CM | POA: Diagnosis not present

## 2023-11-05 DIAGNOSIS — A419 Sepsis, unspecified organism: Secondary | ICD-10-CM | POA: Diagnosis not present

## 2023-11-09 DIAGNOSIS — I129 Hypertensive chronic kidney disease with stage 1 through stage 4 chronic kidney disease, or unspecified chronic kidney disease: Secondary | ICD-10-CM | POA: Diagnosis not present

## 2023-11-09 DIAGNOSIS — E222 Syndrome of inappropriate secretion of antidiuretic hormone: Secondary | ICD-10-CM | POA: Diagnosis not present

## 2023-11-09 DIAGNOSIS — G20A1 Parkinson's disease without dyskinesia, without mention of fluctuations: Secondary | ICD-10-CM | POA: Diagnosis not present

## 2023-11-09 DIAGNOSIS — E1122 Type 2 diabetes mellitus with diabetic chronic kidney disease: Secondary | ICD-10-CM | POA: Diagnosis not present

## 2023-11-09 DIAGNOSIS — R1312 Dysphagia, oropharyngeal phase: Secondary | ICD-10-CM | POA: Diagnosis not present

## 2023-11-09 DIAGNOSIS — M1990 Unspecified osteoarthritis, unspecified site: Secondary | ICD-10-CM | POA: Diagnosis not present

## 2023-11-09 DIAGNOSIS — C641 Malignant neoplasm of right kidney, except renal pelvis: Secondary | ICD-10-CM | POA: Diagnosis not present

## 2023-11-09 DIAGNOSIS — E785 Hyperlipidemia, unspecified: Secondary | ICD-10-CM | POA: Diagnosis not present

## 2023-11-09 DIAGNOSIS — N179 Acute kidney failure, unspecified: Secondary | ICD-10-CM | POA: Diagnosis not present

## 2023-11-09 DIAGNOSIS — Z87891 Personal history of nicotine dependence: Secondary | ICD-10-CM | POA: Diagnosis not present

## 2023-11-09 DIAGNOSIS — K219 Gastro-esophageal reflux disease without esophagitis: Secondary | ICD-10-CM | POA: Diagnosis not present

## 2023-11-09 DIAGNOSIS — N1831 Chronic kidney disease, stage 3a: Secondary | ICD-10-CM | POA: Diagnosis not present

## 2023-11-09 DIAGNOSIS — E119 Type 2 diabetes mellitus without complications: Secondary | ICD-10-CM | POA: Diagnosis not present

## 2023-11-09 DIAGNOSIS — I10 Essential (primary) hypertension: Secondary | ICD-10-CM | POA: Diagnosis not present

## 2023-11-09 DIAGNOSIS — G3184 Mild cognitive impairment, so stated: Secondary | ICD-10-CM | POA: Diagnosis not present

## 2023-11-09 DIAGNOSIS — A419 Sepsis, unspecified organism: Secondary | ICD-10-CM | POA: Diagnosis not present

## 2023-11-09 DIAGNOSIS — J849 Interstitial pulmonary disease, unspecified: Secondary | ICD-10-CM | POA: Diagnosis not present

## 2023-11-13 DIAGNOSIS — N401 Enlarged prostate with lower urinary tract symptoms: Secondary | ICD-10-CM | POA: Diagnosis not present

## 2023-11-13 DIAGNOSIS — R3914 Feeling of incomplete bladder emptying: Secondary | ICD-10-CM | POA: Diagnosis not present

## 2023-11-13 DIAGNOSIS — Z8546 Personal history of malignant neoplasm of prostate: Secondary | ICD-10-CM | POA: Diagnosis not present

## 2023-11-13 DIAGNOSIS — D4101 Neoplasm of uncertain behavior of right kidney: Secondary | ICD-10-CM | POA: Diagnosis not present

## 2023-11-16 DIAGNOSIS — C641 Malignant neoplasm of right kidney, except renal pelvis: Secondary | ICD-10-CM | POA: Diagnosis not present

## 2023-11-16 DIAGNOSIS — G20A1 Parkinson's disease without dyskinesia, without mention of fluctuations: Secondary | ICD-10-CM | POA: Diagnosis not present

## 2023-11-16 DIAGNOSIS — J849 Interstitial pulmonary disease, unspecified: Secondary | ICD-10-CM | POA: Diagnosis not present

## 2023-11-16 DIAGNOSIS — E119 Type 2 diabetes mellitus without complications: Secondary | ICD-10-CM | POA: Diagnosis not present

## 2023-11-16 DIAGNOSIS — I10 Essential (primary) hypertension: Secondary | ICD-10-CM | POA: Diagnosis not present

## 2023-11-16 DIAGNOSIS — E222 Syndrome of inappropriate secretion of antidiuretic hormone: Secondary | ICD-10-CM | POA: Diagnosis not present

## 2023-11-16 DIAGNOSIS — R1312 Dysphagia, oropharyngeal phase: Secondary | ICD-10-CM | POA: Diagnosis not present

## 2023-11-16 DIAGNOSIS — A419 Sepsis, unspecified organism: Secondary | ICD-10-CM | POA: Diagnosis not present

## 2023-11-16 DIAGNOSIS — N179 Acute kidney failure, unspecified: Secondary | ICD-10-CM | POA: Diagnosis not present

## 2023-11-18 DIAGNOSIS — G20A1 Parkinson's disease without dyskinesia, without mention of fluctuations: Secondary | ICD-10-CM | POA: Diagnosis not present

## 2023-11-18 DIAGNOSIS — E119 Type 2 diabetes mellitus without complications: Secondary | ICD-10-CM | POA: Diagnosis not present

## 2023-11-18 DIAGNOSIS — A419 Sepsis, unspecified organism: Secondary | ICD-10-CM | POA: Diagnosis not present

## 2023-11-18 DIAGNOSIS — I10 Essential (primary) hypertension: Secondary | ICD-10-CM | POA: Diagnosis not present

## 2023-11-18 DIAGNOSIS — C641 Malignant neoplasm of right kidney, except renal pelvis: Secondary | ICD-10-CM | POA: Diagnosis not present

## 2023-11-18 DIAGNOSIS — E222 Syndrome of inappropriate secretion of antidiuretic hormone: Secondary | ICD-10-CM | POA: Diagnosis not present

## 2023-11-18 DIAGNOSIS — J849 Interstitial pulmonary disease, unspecified: Secondary | ICD-10-CM | POA: Diagnosis not present

## 2023-11-18 DIAGNOSIS — N179 Acute kidney failure, unspecified: Secondary | ICD-10-CM | POA: Diagnosis not present

## 2023-11-19 DIAGNOSIS — I890 Lymphedema, not elsewhere classified: Secondary | ICD-10-CM | POA: Diagnosis not present

## 2023-11-19 DIAGNOSIS — M5416 Radiculopathy, lumbar region: Secondary | ICD-10-CM | POA: Diagnosis not present

## 2023-11-24 ENCOUNTER — Encounter (HOSPITAL_COMMUNITY): Payer: Medicare PPO

## 2023-11-24 ENCOUNTER — Other Ambulatory Visit (HOSPITAL_COMMUNITY): Payer: Self-pay | Admitting: Internal Medicine

## 2023-11-24 DIAGNOSIS — R600 Localized edema: Secondary | ICD-10-CM

## 2023-11-25 ENCOUNTER — Ambulatory Visit (HOSPITAL_COMMUNITY)
Admission: RE | Admit: 2023-11-25 | Discharge: 2023-11-25 | Disposition: A | Discharge status: Home / Self Care | Payer: Medicare PPO | Source: Ambulatory Visit | Attending: Internal Medicine | Admitting: Internal Medicine

## 2023-11-25 DIAGNOSIS — R600 Localized edema: Secondary | ICD-10-CM | POA: Diagnosis not present

## 2023-11-29 ENCOUNTER — Other Ambulatory Visit: Payer: Self-pay | Admitting: Adult Health

## 2023-12-03 DIAGNOSIS — R600 Localized edema: Secondary | ICD-10-CM | POA: Diagnosis not present

## 2023-12-03 DIAGNOSIS — N401 Enlarged prostate with lower urinary tract symptoms: Secondary | ICD-10-CM | POA: Diagnosis not present

## 2023-12-03 DIAGNOSIS — R3914 Feeling of incomplete bladder emptying: Secondary | ICD-10-CM | POA: Diagnosis not present

## 2023-12-04 DIAGNOSIS — I10 Essential (primary) hypertension: Secondary | ICD-10-CM | POA: Diagnosis not present

## 2023-12-04 DIAGNOSIS — G20A1 Parkinson's disease without dyskinesia, without mention of fluctuations: Secondary | ICD-10-CM | POA: Diagnosis not present

## 2023-12-04 DIAGNOSIS — E441 Mild protein-calorie malnutrition: Secondary | ICD-10-CM | POA: Diagnosis not present

## 2023-12-04 DIAGNOSIS — I872 Venous insufficiency (chronic) (peripheral): Secondary | ICD-10-CM | POA: Diagnosis not present

## 2023-12-04 DIAGNOSIS — D649 Anemia, unspecified: Secondary | ICD-10-CM | POA: Diagnosis not present

## 2023-12-06 DIAGNOSIS — R278 Other lack of coordination: Secondary | ICD-10-CM | POA: Diagnosis not present

## 2023-12-06 DIAGNOSIS — E222 Syndrome of inappropriate secretion of antidiuretic hormone: Secondary | ICD-10-CM | POA: Diagnosis not present

## 2023-12-06 DIAGNOSIS — N179 Acute kidney failure, unspecified: Secondary | ICD-10-CM | POA: Diagnosis not present

## 2023-12-06 DIAGNOSIS — A419 Sepsis, unspecified organism: Secondary | ICD-10-CM | POA: Diagnosis not present

## 2023-12-06 DIAGNOSIS — R1310 Dysphagia, unspecified: Secondary | ICD-10-CM | POA: Diagnosis not present

## 2023-12-06 DIAGNOSIS — M6281 Muscle weakness (generalized): Secondary | ICD-10-CM | POA: Diagnosis not present

## 2023-12-06 DIAGNOSIS — G20C Parkinsonism, unspecified: Secondary | ICD-10-CM | POA: Diagnosis not present

## 2023-12-17 ENCOUNTER — Ambulatory Visit: Payer: Medicare PPO | Attending: Physical Medicine and Rehabilitation | Admitting: Physical Therapy

## 2023-12-17 ENCOUNTER — Encounter: Payer: Self-pay | Admitting: Physical Therapy

## 2023-12-17 ENCOUNTER — Other Ambulatory Visit: Payer: Self-pay

## 2023-12-17 DIAGNOSIS — R29818 Other symptoms and signs involving the nervous system: Secondary | ICD-10-CM | POA: Insufficient documentation

## 2023-12-17 DIAGNOSIS — M6281 Muscle weakness (generalized): Secondary | ICD-10-CM | POA: Diagnosis not present

## 2023-12-17 DIAGNOSIS — R2689 Other abnormalities of gait and mobility: Secondary | ICD-10-CM | POA: Insufficient documentation

## 2023-12-17 DIAGNOSIS — R2681 Unsteadiness on feet: Secondary | ICD-10-CM | POA: Insufficient documentation

## 2023-12-17 NOTE — Therapy (Signed)
 OUTPATIENT PHYSICAL THERAPY NEURO EVALUATION   Patient Name: Scott Wang MRN: [id number] DOB:[birth year], 83 y.o., male Today's Date: 12/17/2023   PCP: Valentin Skates, DO  REFERRING PROVIDER: Bonner Ade, MD   END OF SESSION:  PT End of Session - 12/17/23 1202     Visit Number 1    Number of Visits 17    Date for PT Re-Evaluation 02/12/24    Authorization Type [employer]-auth requested at eval    PT Start Time 1152    PT Stop Time 1232    PT Time Calculation (min) 40 min    Equipment Utilized During Treatment Gait belt    Activity Tolerance Patient tolerated treatment well    Behavior During Therapy WFL for tasks assessed/performed             Past Medical History:  Diagnosis Date   Carpal tunnel syndrome    bilateral, had shots in each one and haven't had any problems since.   Diabetes mellitus    Hyperlipidemia    Hypertension    Prostate cancer (HCC)    Spinal stenosis    Past Surgical History:  Procedure Laterality Date   HERNIA REPAIR     HERNIA REPAIR     INGUINAL HERNIA REPAIR     KNEE SURGERY  right   KNEE SURGERY Left    PROSTATE BIOPSY     RADIOACTIVE SEED IMPLANT N/A 09/07/2018   Procedure: RADIOACTIVE SEED IMPLANT/BRACHYTHERAPY IMPLANT;  Surgeon: Nieves Cough, MD;  Location: [facility];  Service: Urology;  Laterality: N/A;   SPACE OAR INSTILLATION N/A 09/07/2018   Procedure: SPACE OAR INSTILLATION;  Surgeon: Nieves Cough, MD;  Location: [facility];  Service: Urology;  Laterality: N/A;   Patient Active Problem List   Diagnosis Date Noted   AKI (acute kidney injury) (HCC) 09/10/2023   Orthostatic hypotension 11/20/2021   not drinking enough fluids 11/20/2021   Imbalance 11/20/2021   Memory loss 11/20/2021   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   Muscle stiffness due to parkinson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;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   Parkinson's disease (HCC)  12/02/2020   Malignant neoplasm of prostate (HCC) 05/05/2018   Hypertension 06/10/2011   Hyperlipidemia 06/10/2011   Diabetes mellitus (HCC) 06/10/2011   Onychomycosis 06/10/2011   Paronychia 06/10/2011    ONSET DATE: 12/11/2023  REFERRING DIAG: R26.9 (ICD-10-CM) - Abnormality of gait and mobility   THERAPY DIAG:  Unsteadiness on feet  Other abnormalities of gait and mobility  Muscle weakness (generalized)  Other symptoms and signs involving the nervous system  Rationale for Evaluation and Treatment: Rehabilitation  SUBJECTIVE:  SUBJECTIVE STATEMENT: [place] getting out of be in October and could not get up from the floor, couldn't walk for several days.  This was due to a bladder infection and got into my blood system.  Was in the hospital for a week and then in rehab for 2 weeks.  Had PT at home until Christmas and was doing well.  Since PT finished at home, I have gone downhill again.  Was having some leg swelling and MD is aware.  Feel like I'm not doing as well with getting up and down and in/out of car.  Fell twice in the past 2 weeks.  Balance is terrible. Lose balance going backwards or in a tight space. Pt accompanied by: significant other, wife in lobby  PERTINENT HISTORY: BLE pain, severe spinal stenosis L4-5, L3-4, Parkinson's, R hip pain, BLE lymphedema  PAIN:  Are you having pain? No  PRECAUTIONS: Fall  RED FLAGS: None   WEIGHT BEARING RESTRICTIONS: No  FALLS: Has patient fallen in last 6 months? Yes. Number of falls 3  LIVING ENVIRONMENT: Lives with: lives with their spouse Lives in: House/apartment Stairs: Yes: External: 2-3 steps; can reach both Has following equipment at home: Single point cane, Walker - 2 wheeled, and Wheelchair (manual)  PLOF: Independent  PATIENT  GOALS: Pt wants to get back to normal.  OBJECTIVE:  Note: Objective measures were completed at Evaluation unless otherwise noted.  DIAGNOSTIC FINDINGS: NA  COGNITION: Overall cognitive status: Within functional limits for tasks assessed   POSTURE: rounded shoulders, forward head, and bilat UT tremors  LOWER EXTREMITY ROM:     Active  Right Eval Left Eval  Hip flexion    Hip extension    Hip abduction    Hip adduction    Hip internal rotation    Hip external rotation    Knee flexion    Knee extension -10 -12  Ankle dorsiflexion    Ankle plantarflexion    Ankle inversion    Ankle eversion     (Blank rows = not tested)  LOWER EXTREMITY MMT:    MMT Right Eval Left Eval  Hip flexion 4 4  Hip extension    Hip abduction 4 4  Hip adduction 4 4  Hip internal rotation    Hip external rotation    Knee flexion 4 4  Knee extension 4 4  Ankle dorsiflexion 3+ 3+  Ankle plantarflexion    Ankle inversion    Ankle eversion    (Blank rows = not tested)   TRANSFERS: Assistive device utilized: None  Sit to stand: SBA Stand to sit: SBA   GAIT: Gait pattern: step to pattern, step through pattern, decreased arm swing- Right, decreased arm swing- Left, decreased step length- Right, decreased step length- Left, shuffling, poor foot clearance- Right, and poor foot clearance- Left Distance walked: 50 ft x 2 Assistive device utilized: None Level of assistance: SBA Comments: increased BUE tremor, shuffling gait pattern, forward flexed posture  FUNCTIONAL TESTS:  5 times sit to stand: 20.22 sec Timed up and go (TUG): 29.72 sec 10 meter walk test: 19.97 sec = 1.64 ft/sec 360 turn:   R 12 steps   L 9 steps 65M backward walk:  19.93 sec TUG cognitive:30.89 sec TUG manual:  23.5 sec  TREATMENT DATE: 12/17/2023    PATIENT EDUCATION: Education details:  Eval results, POC, HEP initiated Person educated: Patient Education method: Explanation, Demonstration, and Handouts Education comprehension: verbalized understanding  HOME EXERCISE PROGRAM: Access Code: [email or web address] URL: [email or web address] Date: 12/17/2023 Prepared by: [facility] - Outpatient  Rehab - [facility]  Exercises - Sit to Stand  - 1 x daily - 7 x weekly - 3 sets - 5 reps - Seated March  - 1 x daily - 7 x weekly - 3 sets - 10 reps - Seated Long Arc Quad  - 1 x daily - 7 x weekly - 3 sets - 10 reps - 3 sec hold - Seated Ankle Dorsiflexion AROM  - 1 x daily - 7 x weekly - 3 sets - 10 reps - 3 sec hold  GOALS: Goals reviewed with patient? Yes  SHORT TERM GOALS: Target date: 01/15/2024  Pt will be independent with HEP for improved strength, balance, gait. Baseline: Goal status: INITIAL  2.  Pt will improve 5x sit<>stand to less than or equal to 15 sec to demonstrate improved functional strength and transfer efficiency. Baseline: 20.22 sec Goal status: INITIAL  3.  Pt will improve TUG score to less than or equal to 20 sec for decreased fall risk. Baseline: 29.72 sec Goal status: INITIAL  LONG TERM GOALS: Target date: 02/12/2024  Pt will be independent with progression of HEP for improved balance, gait, strength. Baseline:  Goal status: INITIAL  2.  Pt will improve gait velocity to at least 2.6 ft/sec for improved gait efficiency and safety. Baseline: 1.64 ft/sec Goal status: INITIAL  3.  Pt will improve TUG score to less than or equal to 15 sec for decreased fall risk. Baseline: 29.72 sec Goal status: INITIAL  4.  Pt will perform 89M Walk test Backwards in < or equal to 10 seconds for improved balance in posterior direction. Baseline: 19 sec Goal status: INITIAL  5.  Pt will verbalize plans for continued community fitness upon d/c from PT to maximize gains made in PT. Baseline:  Goal status: INITIAL  ASSESSMENT:  CLINICAL  IMPRESSION: Patient is an 83 y.o. male who was seen today for physical therapy evaluation and treatment for gait abnormality.  He has history of Parkinson's disease, back and leg pain, spinal stenosis; he was hospitalized x 1 week in Oct 2024 due to severe UTI; then he received rehab and home health PT services.  Prior to this, he had just been discharged from OPPT at this clinic and was doing quite well with functional mobility.  He feels he has regressed since previous bout of PT here.  He presents today with decreased strength, decreased balance, decreased transfer independence, decreased timing and coordination of gait, shuffling gait, forward posture, bradykinesia, and UE tremors.  He is at increased fall risk per TUG, FTSTS and gait velocity as well as walk test measures.  He reports 2 falls in the past 2 weeks.  He will benefit from skilled PT to address the above deficits to decrease fall risk and improve overall functional mobility and decrease fall risk.  OBJECTIVE IMPAIRMENTS: Abnormal gait, decreased balance, decreased mobility, difficulty walking, decreased strength, impaired flexibility, and postural dysfunction.   ACTIVITY LIMITATIONS: standing, transfers, and locomotion level  PARTICIPATION LIMITATIONS: shopping, community activity, and yard work  PERSONAL FACTORS: 3+ comorbidities: see PMH  are also affecting patient's functional outcome.   REHAB POTENTIAL: Good  CLINICAL DECISION MAKING: Evolving/moderate complexity  EVALUATION COMPLEXITY: Moderate  PLAN:  PT  FREQUENCY: 2x/week  PT DURATION: 8 weeks plus eval visit   PLANNED INTERVENTIONS: 97110-Therapeutic exercises, 97530- Therapeutic activity, [id number]- Neuromuscular re-education, 97535- Self Care, 02859- Manual therapy, [phone]- Gait training, Patient/Family education, Balance training, and DME instructions  PLAN FOR NEXT SESSION: Review initial HEP and progress exercises for home; activities to increase step  length/foot clearance.  Discuss cane/walker use to help with gait pattern(?)   Gillie Fleites W., PT 12/17/2023, 2:44 PM  [facility] at [facility] [address] [place], [address] [place], [place] [address] Phone # [phone] Fax # [phone]  Referring diagnosis? R26.9 (ICD-10-CM) - Abnormality of gait and mobility  Treatment diagnosis? (if different than referring diagnosis) R26.81, R26.89, M62.81, R29.818 What was this (referring dx) caused by? []  Surgery []  Fall [x]  Ongoing issue []  Arthritis []  Other: ____________  Laterality: [x]  Rt [x]  Lt []  Both  Check all possible CPT codes:  *CHOOSE 10 OR LESS*    See Planned Interventions listed in the Plan section of the Evaluation.

## 2023-12-18 ENCOUNTER — Ambulatory Visit: Payer: Medicare PPO

## 2023-12-18 DIAGNOSIS — R29818 Other symptoms and signs involving the nervous system: Secondary | ICD-10-CM | POA: Diagnosis not present

## 2023-12-18 DIAGNOSIS — R2689 Other abnormalities of gait and mobility: Secondary | ICD-10-CM | POA: Diagnosis not present

## 2023-12-18 DIAGNOSIS — R2681 Unsteadiness on feet: Secondary | ICD-10-CM | POA: Diagnosis not present

## 2023-12-18 DIAGNOSIS — M6281 Muscle weakness (generalized): Secondary | ICD-10-CM

## 2023-12-18 NOTE — Therapy (Signed)
 OUTPATIENT PHYSICAL THERAPY NEURO TREATMENT   Patient Name: CALOGERO GEISEN MRN: [id number] DOB:1941-04-01, 83 y.o., male Today's Date: 12/18/2023   PCP: Valentin Skates, DO  REFERRING PROVIDER: Bonner Ade, MD   END OF SESSION:  PT End of Session - 12/18/23 1032     Visit Number 2    Number of Visits 17    Date for PT Re-Evaluation 02/12/24    Authorization Type [employer] Medicare-auth requested at eval    PT Start Time 1030    PT Stop Time 1115    PT Time Calculation (min) 45 min    Equipment Utilized During Treatment Gait belt    Activity Tolerance Patient tolerated treatment well    Behavior During Therapy WFL for tasks assessed/performed             Past Medical History:  Diagnosis Date   Carpal tunnel syndrome    bilateral, had shots in each one and haven't had any problems since.   Diabetes mellitus    Hyperlipidemia    Hypertension    Prostate cancer (HCC)    Spinal stenosis    Past Surgical History:  Procedure Laterality Date   HERNIA REPAIR     HERNIA REPAIR     INGUINAL HERNIA REPAIR     KNEE SURGERY  right   KNEE SURGERY Left    PROSTATE BIOPSY     RADIOACTIVE SEED IMPLANT N/A 09/07/2018   Procedure: RADIOACTIVE SEED IMPLANT/BRACHYTHERAPY IMPLANT;  Surgeon: Nieves Cough, MD;  Location: [facility];  Service: Urology;  Laterality: N/A;   SPACE OAR INSTILLATION N/A 09/07/2018   Procedure: SPACE OAR INSTILLATION;  Surgeon: Nieves Cough, MD;  Location: [facility];  Service: Urology;  Laterality: N/A;   Patient Active Problem List   Diagnosis Date Noted   AKI (acute kidney injury) (HCC) 09/10/2023   Orthostatic hypotension 11/20/2021   not drinking enough fluids 11/20/2021   Imbalance 11/20/2021   Memory loss 11/20/2021   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   Muscle stiffness due to parkinson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;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   Parkinson's disease (HCC)  12/02/2020   Malignant neoplasm of prostate (HCC) 05/05/2018   Hypertension 06/10/2011   Hyperlipidemia 06/10/2011   Diabetes mellitus (HCC) 06/10/2011   Onychomycosis 06/10/2011   Paronychia 06/10/2011    ONSET DATE: 12/11/2023  REFERRING DIAG: R26.9 (ICD-10-CM) - Abnormality of gait and mobility   THERAPY DIAG:  Unsteadiness on feet  Other abnormalities of gait and mobility  Muscle weakness (generalized)  Other symptoms and signs involving the nervous system  Rationale for Evaluation and Treatment: Rehabilitation  SUBJECTIVE:  SUBJECTIVE STATEMENT: [place] getting out of be in October and could not get up from the floor, couldn't walk for several days.  This was due to a bladder infection and got into my blood system.  Was in the hospital for a week and then in rehab for 2 weeks.  Had PT at home until Christmas and was doing well.  Since PT finished at home, I have gone downhill again.  Was having some leg swelling and MD is aware.  Feel like I'm not doing as well with getting up and down and in/out of car.  Fell twice in the past 2 weeks.  Balance is terrible. Lose balance going backwards or in a tight space. Pt accompanied by: significant other, wife in lobby  PERTINENT HISTORY: BLE pain, severe spinal stenosis L4-5, L3-4, Parkinson's, R hip pain, BLE lymphedema  PAIN:  Are you having pain? No  PRECAUTIONS: Fall  RED FLAGS: None   WEIGHT BEARING RESTRICTIONS: No  FALLS: Has patient fallen in last 6 months? Yes. Number of falls 3  LIVING ENVIRONMENT: Lives with: lives with their spouse Lives in: House/apartment Stairs: Yes: External: 2-3 steps; can reach both Has following equipment at home: Single point cane, Walker - 2 wheeled, and Wheelchair (manual)  PLOF: Independent  PATIENT  GOALS: Pt wants to get back to normal.  OBJECTIVE:   TODAY'S TREATMENT: 12/18/23 Activity Comments  NU-step speed intervals x 8 min 2 min warm-upp 30 sec sprints; 30 sec recovery. Goal of 100 SPM (only able to achieve 85). Cues for transitions  Large amplitude leg swings x 60 sec To improve hip extension ROM and awareness  Retro-walking 4x25 ft Cues for increased hip extension  Balance  Multisensory balance activities and techniques to facilitate righting reactions  Gastroc stretch 2x60 sec slantboard       Note: Objective measures were completed at Evaluation unless otherwise noted.  DIAGNOSTIC FINDINGS: NA  COGNITION: Overall cognitive status: Within functional limits for tasks assessed   POSTURE: rounded shoulders, forward head, and bilat UT tremors  LOWER EXTREMITY ROM:     Active  Right Eval Left Eval  Hip flexion    Hip extension    Hip abduction    Hip adduction    Hip internal rotation    Hip external rotation    Knee flexion    Knee extension -10 -12  Ankle dorsiflexion    Ankle plantarflexion    Ankle inversion    Ankle eversion     (Blank rows = not tested)  LOWER EXTREMITY MMT:    MMT Right Eval Left Eval  Hip flexion 4 4  Hip extension    Hip abduction 4 4  Hip adduction 4 4  Hip internal rotation    Hip external rotation    Knee flexion 4 4  Knee extension 4 4  Ankle dorsiflexion 3+ 3+  Ankle plantarflexion    Ankle inversion    Ankle eversion    (Blank rows = not tested)   TRANSFERS: Assistive device utilized: None  Sit to stand: SBA Stand to sit: SBA   GAIT: Gait pattern: step to pattern, step through pattern, decreased arm swing- Right, decreased arm swing- Left, decreased step length- Right, decreased step length- Left, shuffling, poor foot clearance- Right, and poor foot clearance- Left Distance walked: 50 ft x 2 Assistive device utilized: None Level of assistance: SBA Comments: increased BUE tremor, shuffling gait pattern,  forward flexed posture  FUNCTIONAL TESTS:  5 times sit to stand:  20.22 sec Timed up and go (TUG): 29.72 sec 10 meter walk test: 19.97 sec = 1.64 ft/sec 360 turn:   R 12 steps   L 9 steps 412M backward walk:  19.93 sec TUG cognitive:30.89 sec TUG manual:  23.5 sec                                                                                                                               TREATMENT DATE: 12/17/2023    PATIENT EDUCATION: Education details: Eval results, POC, HEP initiated Person educated: Patient Education method: Explanation, Demonstration, and Handouts Education comprehension: verbalized understanding  HOME EXERCISE PROGRAM: Access Code: [email or web address] URL: [email or web address] Date: 12/17/2023 Prepared by: [facility] - Outpatient  Rehab - [facility]  Exercises - Sit to Stand  - 1 x daily - 7 x weekly - 3 sets - 5 reps - Seated March  - 1 x daily - 7 x weekly - 3 sets - 10 reps - Seated Long Arc Quad  - 1 x daily - 7 x weekly - 3 sets - 10 reps - 3 sec hold - Seated Ankle Dorsiflexion AROM  - 1 x daily - 7 x weekly - 3 sets - 10 reps - 3 sec hold  GOALS: Goals reviewed with patient? Yes  SHORT TERM GOALS: Target date: 01/15/2024  Pt will be independent with HEP for improved strength, balance, gait. Baseline: Goal status: INITIAL  2.  Pt will improve 5x sit<>stand to less than or equal to 15 sec to demonstrate improved functional strength and transfer efficiency. Baseline: 20.22 sec Goal status: INITIAL  3.  Pt will improve TUG score to less than or equal to 20 sec for decreased fall risk. Baseline: 29.72 sec Goal status: INITIAL  LONG TERM GOALS: Target date: 02/12/2024  Pt will be independent with progression of HEP for improved balance, gait, strength. Baseline:  Goal status: INITIAL  2.  Pt will improve gait velocity to at least 2.6 ft/sec for improved gait efficiency and safety. Baseline: 1.64 ft/sec Goal status: INITIAL  3.  Pt  will improve TUG score to less than or equal to 15 sec for decreased fall risk. Baseline: 29.72 sec Goal status: INITIAL  4.  Pt will perform 412M Walk test Backwards in < or equal to 10 seconds for improved balance in posterior direction. Baseline: 19 sec Goal status: INITIAL  5.  Pt will verbalize plans for continued community fitness upon d/c from PT to maximize gains made in PT. Baseline:  Goal status: INITIAL  ASSESSMENT:  CLINICAL IMPRESSION: Iniitated session with NU-step with goal of performing rapid paced intervals to improve rapid alternating coordination for gait, frequent cues and external reference to sustain speed with limited ability to exceed 80 SPM.  Continued with activities to facilitate large amplitude movement to promote greater hip extension.  Continued with activities to facilitate postural stability and righting reactions to improve speed and amplitude of movement for ankle, hip, and  stepping strategy which are laregly delayed and non-function resulting in frequent retro and right lateral LOB during demands.  Reinforced HEP activities to improve general strength and activity tolerance. Continued sessions to advance POC details to improve mobility and reduce risk for falls  OBJECTIVE IMPAIRMENTS: Abnormal gait, decreased balance, decreased mobility, difficulty walking, decreased strength, impaired flexibility, and postural dysfunction.   ACTIVITY LIMITATIONS: standing, transfers, and locomotion level  PARTICIPATION LIMITATIONS: shopping, community activity, and yard work  PERSONAL FACTORS: 3+ comorbidities: see PMH  are also affecting patient's functional outcome.   REHAB POTENTIAL: Good  CLINICAL DECISION MAKING: Evolving/moderate complexity  EVALUATION COMPLEXITY: Moderate  PLAN:  PT FREQUENCY: 2x/week  PT DURATION: 8 weeks plus eval visit   PLANNED INTERVENTIONS: 97110-Therapeutic exercises, 97530- Therapeutic activity, 97112- Neuromuscular re-education,  97535- Self Care, 02859- Manual therapy, [phone]- Gait training, Patient/Family education, Balance training, and DME instructions  PLAN FOR NEXT SESSION: Review initial HEP and progress exercises for home; activities to increase step length/foot clearance.  Discuss cane/walker use to help with gait pattern(?)   Katina Remick K Kathryne Ramella, PT 12/18/2023, 10:32 AM

## 2023-12-25 ENCOUNTER — Encounter: Payer: Self-pay | Admitting: Physical Therapy

## 2023-12-25 ENCOUNTER — Ambulatory Visit: Payer: Medicare PPO | Admitting: Physical Therapy

## 2023-12-25 DIAGNOSIS — R29818 Other symptoms and signs involving the nervous system: Secondary | ICD-10-CM

## 2023-12-25 DIAGNOSIS — R2689 Other abnormalities of gait and mobility: Secondary | ICD-10-CM | POA: Diagnosis not present

## 2023-12-25 DIAGNOSIS — R2681 Unsteadiness on feet: Secondary | ICD-10-CM

## 2023-12-25 DIAGNOSIS — M6281 Muscle weakness (generalized): Secondary | ICD-10-CM | POA: Diagnosis not present

## 2023-12-25 NOTE — Therapy (Signed)
OUTPATIENT PHYSICAL THERAPY NEURO TREATMENT   Patient Name: Scott Wang MRN: [id number] DOB:1940-12-18, 83 y.o., male Today's Date: 12/25/2023   PCP: [name], DO  REFERRING PROVIDER: Sheran Luz, MD   END OF SESSION:  PT End of Session - 12/25/23 0939     Visit Number 3    Number of Visits 17    Date for PT Re-Evaluation 02/12/24    Authorization Type [employer] Medicare-auth requested at eval    Authorization Time Period approved 17 PT visits from 12/17/2023-02/12/2024    Authorization - Visit Number 3    Authorization - Number of Visits 17    PT Start Time 0935    PT Stop Time 1015    PT Time Calculation (min) 40 min    Equipment Utilized During Treatment Gait belt    Activity Tolerance Patient tolerated treatment well    Behavior During Therapy WFL for tasks assessed/performed              Past Medical History:  Diagnosis Date   Carpal tunnel syndrome    bilateral, had shots in each one and "haven't had any problems since".   Diabetes mellitus    Hyperlipidemia    Hypertension    Prostate cancer (HCC)    Spinal stenosis    Past Surgical History:  Procedure Laterality Date   HERNIA REPAIR     HERNIA REPAIR     INGUINAL HERNIA REPAIR     KNEE SURGERY  right   KNEE SURGERY Left    PROSTATE BIOPSY     RADIOACTIVE SEED IMPLANT N/A 09/07/2018   Procedure: RADIOACTIVE SEED IMPLANT/BRACHYTHERAPY IMPLANT;  Surgeon: Jerilee Field, MD;  Location: [facility];  Service: Urology;  Laterality: N/A;   SPACE OAR INSTILLATION N/A 09/07/2018   Procedure: SPACE OAR INSTILLATION;  Surgeon: Jerilee Field, MD;  Location: [facility];  Service: Urology;  Laterality: N/A;   Patient Active Problem List   Diagnosis Date Noted   AKI (acute kidney injury) (HCC) 09/10/2023   Orthostatic hypotension 11/20/2021   not drinking enough fluids 11/20/2021   Imbalance 11/20/2021   Memory loss 11/20/2021   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  11/20/2021   Muscle stiffness due to parkinson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;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   Parkinson's disease (HCC) 12/02/2020   Malignant neoplasm of prostate (HCC) 05/05/2018   Hypertension 06/10/2011   Hyperlipidemia 06/10/2011   Diabetes mellitus (HCC) 06/10/2011   Onychomycosis 06/10/2011   Paronychia 06/10/2011    ONSET DATE: 12/11/2023  REFERRING DIAG: R26.9 (ICD-10-CM) - Abnormality of gait and mobility   THERAPY DIAG:  Unsteadiness on feet  Other abnormalities of gait and mobility  Muscle weakness (generalized)  Other symptoms and signs involving the nervous system  Rationale for Evaluation and Treatment: Rehabilitation  SUBJECTIVE:  SUBJECTIVE STATEMENT: No changes.  Brought in the cane and want you to teach me to use it.  Pt accompanied by: significant other, wife in lobby  PERTINENT HISTORY: BLE pain, severe spinal stenosis L4-5, L3-4, Parkinson's, R hip pain, BLE lymphedema  PAIN:  Are you having pain? No  PRECAUTIONS: Fall  RED FLAGS: None   WEIGHT BEARING RESTRICTIONS: No  FALLS: Has patient fallen in last 6 months? Yes. Number of falls 3  LIVING ENVIRONMENT: Lives with: lives with their spouse Lives in: House/apartment Stairs: Yes: External: 2-3 steps; can reach both Has following equipment at home: Single point cane, Walker - 2 wheeled, and Wheelchair (manual)  PLOF: Independent  PATIENT GOALS: Pt wants to get back to normal.  OBJECTIVE:    TODAY'S TREATMENT: 12/25/2023 Activity Comments  NuStep, Level 3, 4 extremities x 8 minutes For aerobic warm up, 2 min warm up; 30 sec bouts of >80 SPM; baseline is 70 SPM  Forward/back walking at parallel bars/counter, 5 reps Initially-7 steps forward/14 steps back-cues for less steps  to increase step length (able to decr.by 2-3 steps)  Heel/toe raises, 12 reps, 3"   Gastroc stretch, runner's stretch position, 3 x 30 sec   Gait training with cane, 25 ft x 8 reps, then 120 ft Hand over hand, verbal cues and assist for correct sequence with cane in R hand.  Cues for slowed pace, increased step length; pt hastens gait and comes out of sequence after 10-15 ft  Gait training with cane, 50 ft x 6 reps Cues for taking as few steps as possible-able to decrease by 3-5 steps with repetition (still difficulty sequencing cane)      HOME EXERCISE PROGRAM: Access Code: [email or web address] URL: [email or web address] Date: 12/25/2023 Prepared by: [facility] - Outpatient  Rehab - [facility]  Exercises - Sit to Stand  - 1 x daily - 7 x weekly - 3 sets - 5 reps - Seated March  - 1 x daily - 7 x weekly - 3 sets - 10 reps - Seated Long Arc Quad  - 1 x daily - 7 x weekly - 3 sets - 10 reps - 3 sec hold - Seated Ankle Dorsiflexion AROM  - 1 x daily - 7 x weekly - 3 sets - 10 reps - 3 sec hold - Standing Gastroc Stretch at Counter  - 1 x daily - 7 x weekly - 1 sets - 3 reps - 30 sec hold  PATIENT EDUCATION: Education details: HEP addition, cane sequence, walking at home "taking as few steps as possible" Person educated: Patient Education method: Explanation, Demonstration, and Handouts Education comprehension: verbalized understanding, returned demonstration, and needs further education   --------------------------------------------------------------------------------------------------------------- Note: Objective measures were completed at Evaluation unless otherwise noted.  DIAGNOSTIC FINDINGS: NA  COGNITION: Overall cognitive status: Within functional limits for tasks assessed   POSTURE: rounded shoulders, forward head, and bilat UT tremors  LOWER EXTREMITY ROM:     Active  Right Eval Left Eval  Hip flexion    Hip extension    Hip abduction    Hip adduction    Hip  internal rotation    Hip external rotation    Knee flexion    Knee extension -10 -12  Ankle dorsiflexion    Ankle plantarflexion    Ankle inversion    Ankle eversion     (Blank rows = not tested)  LOWER EXTREMITY MMT:    MMT Right Eval Left Eval  Hip flexion 4 4  Hip  extension    Hip abduction 4 4  Hip adduction 4 4  Hip internal rotation    Hip external rotation    Knee flexion 4 4  Knee extension 4 4  Ankle dorsiflexion 3+ 3+  Ankle plantarflexion    Ankle inversion    Ankle eversion    (Blank rows = not tested)   TRANSFERS: Assistive device utilized: None  Sit to stand: SBA Stand to sit: SBA   GAIT: Gait pattern: step to pattern, step through pattern, decreased arm swing- Right, decreased arm swing- Left, decreased step length- Right, decreased step length- Left, shuffling, poor foot clearance- Right, and poor foot clearance- Left Distance walked: 50 ft x 2 Assistive device utilized: None Level of assistance: SBA Comments: increased BUE tremor, shuffling gait pattern, forward flexed posture  FUNCTIONAL TESTS:  5 times sit to stand: 20.22 sec Timed up and go (TUG): 29.72 sec 10 meter walk test: 19.97 sec = 1.64 ft/sec 360 turn:   R 12 steps   L 9 steps 42M backward walk:  19.93 sec TUG cognitive:30.89 sec TUG manual:  23.5 sec                                                                                                                               TREATMENT DATE: 12/17/2023    PATIENT EDUCATION: Education details: Eval results, POC, HEP initiated Person educated: Patient Education method: Explanation, Demonstration, and Handouts Education comprehension: verbalized understanding  HOME EXERCISE PROGRAM: Access Code: [email or web address] URL: [email or web address] Date: 12/17/2023 Prepared by: [facility] - Outpatient  Rehab - [facility]  Exercises - Sit to Stand  - 1 x daily - 7 x weekly - 3 sets - 5 reps - Seated March  - 1 x daily - 7 x  weekly - 3 sets - 10 reps - Seated Long Arc Quad  - 1 x daily - 7 x weekly - 3 sets - 10 reps - 3 sec hold - Seated Ankle Dorsiflexion AROM  - 1 x daily - 7 x weekly - 3 sets - 10 reps - 3 sec hold  GOALS: Goals reviewed with patient? Yes  SHORT TERM GOALS: Target date: 01/15/2024  Pt will be independent with HEP for improved strength, balance, gait. Baseline: Goal status: INITIAL  2.  Pt will improve 5x sit<>stand to less than or equal to 15 sec to demonstrate improved functional strength and transfer efficiency. Baseline: 20.22 sec Goal status: INITIAL  3.  Pt will improve TUG score to less than or equal to 20 sec for decreased fall risk. Baseline: 29.72 sec Goal status: INITIAL  LONG TERM GOALS: Target date: 02/12/2024  Pt will be independent with progression of HEP for improved balance, gait, strength. Baseline:  Goal status: INITIAL  2.  Pt will improve gait velocity to at least 2.6 ft/sec for improved gait efficiency and safety. Baseline: 1.64 ft/sec Goal status: INITIAL  3.  Pt will improve TUG score  to less than or equal to 15 sec for decreased fall risk. Baseline: 29.72 sec Goal status: INITIAL  4.  Pt will perform 43M Walk test Backwards in < or equal to 10 seconds for improved balance in posterior direction. Baseline: 19 sec Goal status: INITIAL  5.  Pt will verbalize plans for continued community fitness upon d/c from PT to maximize gains made in PT. Baseline:  Goal status: INITIAL  ASSESSMENT:  CLINICAL IMPRESSION: Pt presents today with no new complaints. Skilled PT session focused on aerobic warm up for lower extremity strengthening/flexibility, gastroc stretching, and then gait training activities.  Pt needs verbal, visual, hand over hand assist cueing for gait sequencing with cane.  With initial assist/attention to large steps initially, he is able to sequence cane; however, he quickly reverts to smaller step pattern and gets off sequence with cane.  He will  need more practice with gait using cane.   Pt will continue to benefit from skilled PT towards goals for improved functional mobility and decreased fall risk.   OBJECTIVE IMPAIRMENTS: Abnormal gait, decreased balance, decreased mobility, difficulty walking, decreased strength, impaired flexibility, and postural dysfunction.   ACTIVITY LIMITATIONS: standing, transfers, and locomotion level  PARTICIPATION LIMITATIONS: shopping, community activity, and yard work  PERSONAL FACTORS: 3+ comorbidities: see PMH  are also affecting patient's functional outcome.   REHAB POTENTIAL: Good  CLINICAL DECISION MAKING: Evolving/moderate complexity  EVALUATION COMPLEXITY: Moderate  PLAN:  PT FREQUENCY: 2x/week  PT DURATION: 8 weeks plus eval visit   PLANNED INTERVENTIONS: 97110-Therapeutic exercises, 97530- Therapeutic activity, 97112- Neuromuscular re-education, 97535- Self Care, 16109- Manual therapy, [phone]- Gait training, Patient/Family education, Balance training, and DME instructions  PLAN FOR NEXT SESSION: Review gastroc stretch addition to HEP and progress exercises for home; activities to increase step length/foot clearance.  Discuss cane/walker use to help with gait pattern(?)   Grizelda Piscopo W., PT 12/25/2023, 12:09 PM  [facility] at [facility] [address] [place], [address] [place], [place] [address] Phone # [phone] Fax # [phone]

## 2023-12-28 ENCOUNTER — Encounter: Payer: Self-pay | Admitting: Physical Therapy

## 2023-12-28 ENCOUNTER — Ambulatory Visit: Payer: Medicare PPO | Admitting: Physical Therapy

## 2023-12-28 DIAGNOSIS — M6281 Muscle weakness (generalized): Secondary | ICD-10-CM | POA: Diagnosis not present

## 2023-12-28 DIAGNOSIS — R2681 Unsteadiness on feet: Secondary | ICD-10-CM | POA: Diagnosis not present

## 2023-12-28 DIAGNOSIS — R29818 Other symptoms and signs involving the nervous system: Secondary | ICD-10-CM | POA: Diagnosis not present

## 2023-12-28 DIAGNOSIS — R2689 Other abnormalities of gait and mobility: Secondary | ICD-10-CM

## 2023-12-28 NOTE — Therapy (Signed)
 OUTPATIENT PHYSICAL THERAPY NEURO TREATMENT   Patient Name: Scott Wang MRN: [id number] DOB:[birth year], 83 y.o., male Today's Date: 12/28/2023   PCP: [name], DO  REFERRING PROVIDER: Sheran Luz, MD   END OF SESSION:  PT End of Session - 12/28/23 1319     Visit Number 4    Number of Visits 17    Date for PT Re-Evaluation 02/12/24    Authorization Type [employer] Medicare-auth requested at eval    Authorization Time Period approved 17 PT visits from 12/17/2023-02/12/2024    Authorization - Visit Number 4    Authorization - Number of Visits 17    Progress Note Due on Visit 10    PT Start Time 1319    PT Stop Time 1400    PT Time Calculation (min) 41 min    Equipment Utilized During Treatment Gait belt    Activity Tolerance Patient tolerated treatment well    Behavior During Therapy WFL for tasks assessed/performed              Past Medical History:  Diagnosis Date   Carpal tunnel syndrome    bilateral, had shots in each one and "haven't had any problems since".   Diabetes mellitus    Hyperlipidemia    Hypertension    Prostate cancer (HCC)    Spinal stenosis    Past Surgical History:  Procedure Laterality Date   HERNIA REPAIR     HERNIA REPAIR     INGUINAL HERNIA REPAIR     KNEE SURGERY  right   KNEE SURGERY Left    PROSTATE BIOPSY     RADIOACTIVE SEED IMPLANT N/A 09/07/2018   Procedure: RADIOACTIVE SEED IMPLANT/BRACHYTHERAPY IMPLANT;  Surgeon: Jerilee Field, MD;  Location: [facility];  Service: Urology;  Laterality: N/A;   SPACE OAR INSTILLATION N/A 09/07/2018   Procedure: SPACE OAR INSTILLATION;  Surgeon: Jerilee Field, MD;  Location: [facility];  Service: Urology;  Laterality: N/A;   Patient Active Problem List   Diagnosis Date Noted   AKI (acute kidney injury) (HCC) 09/10/2023   Orthostatic hypotension 11/20/2021   not drinking enough fluids 11/20/2021   Imbalance 11/20/2021   Memory loss 11/20/2021    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   Muscle stiffness due to parkinson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;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   Parkinson's disease (HCC) 12/02/2020   Malignant neoplasm of prostate (HCC) 05/05/2018   Hypertension 06/10/2011   Hyperlipidemia 06/10/2011   Diabetes mellitus (HCC) 06/10/2011   Onychomycosis 06/10/2011   Paronychia 06/10/2011    ONSET DATE: 12/11/2023  REFERRING DIAG: R26.9 (ICD-10-CM) - Abnormality of gait and mobility   THERAPY DIAG:  Unsteadiness on feet  Other abnormalities of gait and mobility  Muscle weakness (generalized)  Other symptoms and signs involving the nervous system  Rationale for Evaluation and Treatment: Rehabilitation  SUBJECTIVE:  SUBJECTIVE STATEMENT: Been trying the exercises at home.  Sometimes have trouble getting up from seats at home.   Pt accompanied by: significant other, wife in lobby  PERTINENT HISTORY: BLE pain, severe spinal stenosis L4-5, L3-4, Parkinson's, R hip pain, BLE lymphedema  PAIN:  Are you having pain? No  PRECAUTIONS: Fall  RED FLAGS: None   WEIGHT BEARING RESTRICTIONS: No  FALLS: Has patient fallen in last 6 months? Yes. Number of falls 3  LIVING ENVIRONMENT: Lives with: lives with their spouse Lives in: House/apartment Stairs: Yes: External: 2-3 steps; can reach both Has following equipment at home: Single point cane, Walker - 2 wheeled, and Wheelchair (manual)  PLOF: Independent  PATIENT GOALS: Pt wants to get back to normal.  OBJECTIVE:    TODAY'S TREATMENT: 12/28/2023 Activity Comments  Sit to stand, 3 x 5 reps; last set of 5 with lifting 1.1# weighted ball overhead Cues for upright posture upon standing; cues for forward scoot, foot placement, increased forward lean   Reviewed gastroc stretch, 2 x 30" Min cues for foot placement  Forward/back walking at counter, 5 reps Cues for as few steps as possible, LOB in posterior direction with narrow BOS and decreased step length  Marching in place 2 x 10 reps 1 UE support, 3# BLE, cues for increased lifting each leg, bradykinesia reps 5-8  Side step taps 10 reps 3#-cues for foot clearance  Back step taps 10 reps 3#, cues for foot clearance  Forward step taps to 4" step BUE support, 3#  Repeated the above exercises with no weights   Nustep, Level 3, 4 extremities, x 6 minutes SPM>70-80    HOME EXERCISE PROGRAM:  Access Code: [email or web address] URL: [email or web address] Date: 12/28/2023 Prepared by: [facility] - Outpatient  Rehab - [facility]  Exercises - Sit to Stand  - 1 x daily - 7 x weekly - 3 sets - 5 reps - Seated March  - 1 x daily - 7 x weekly - 3 sets - 10 reps - Seated Long Arc Quad  - 1 x daily - 7 x weekly - 3 sets - 10 reps - 3 sec hold - Seated Ankle Dorsiflexion AROM  - 1 x daily - 7 x weekly - 3 sets - 10 reps - 3 sec hold - Standing Gastroc Stretch at Counter  - 1 x daily - 7 x weekly - 1 sets - 3 reps - 30 sec hold - Side Stepping with Counter Support  - 1 x daily - 5 x weekly - 2 sets - 10 reps - Alternating Step Backward with Support  - 1 x daily - 5 x weekly - 2 sets - 10 reps - Alternating Step Taps with Counter Support  - 1 x daily - 5 x weekly - 2 sets - 10 reps    PATIENT EDUCATION: Education details: HEP additions, try to get some ankle weights for home Person educated: Patient Education method: Explanation, Demonstration, and Handouts Education comprehension: verbalized understanding, returned demonstration, and needs further education   --------------------------------------------------------------------------------------------------------------- Note: Objective measures were completed at Evaluation unless otherwise noted.  DIAGNOSTIC FINDINGS:  NA  COGNITION: Overall cognitive status: Within functional limits for tasks assessed   POSTURE: rounded shoulders, forward head, and bilat UT tremors  LOWER EXTREMITY ROM:     Active  Right Eval Left Eval  Hip flexion    Hip extension    Hip abduction    Hip adduction    Hip internal rotation    Hip external  rotation    Knee flexion    Knee extension -10 -12  Ankle dorsiflexion    Ankle plantarflexion    Ankle inversion    Ankle eversion     (Blank rows = not tested)  LOWER EXTREMITY MMT:    MMT Right Eval Left Eval  Hip flexion 4 4  Hip extension    Hip abduction 4 4  Hip adduction 4 4  Hip internal rotation    Hip external rotation    Knee flexion 4 4  Knee extension 4 4  Ankle dorsiflexion 3+ 3+  Ankle plantarflexion    Ankle inversion    Ankle eversion    (Blank rows = not tested)   TRANSFERS: Assistive device utilized: None  Sit to stand: SBA Stand to sit: SBA   GAIT: Gait pattern: step to pattern, step through pattern, decreased arm swing- Right, decreased arm swing- Left, decreased step length- Right, decreased step length- Left, shuffling, poor foot clearance- Right, and poor foot clearance- Left Distance walked: 50 ft x 2 Assistive device utilized: None Level of assistance: SBA Comments: increased BUE tremor, shuffling gait pattern, forward flexed posture  FUNCTIONAL TESTS:  5 times sit to stand: 20.22 sec Timed up and go (TUG): 29.72 sec 10 meter walk test: 19.97 sec = 1.64 ft/sec 360 turn:   R 12 steps   L 9 steps 64M backward walk:  19.93 sec TUG cognitive:30.89 sec TUG manual:  23.5 sec                                                                                                                               TREATMENT DATE: 12/17/2023    PATIENT EDUCATION: Education details: Eval results, POC, HEP initiated Person educated: Patient Education method: Explanation, Demonstration, and Handouts Education comprehension: verbalized  understanding  HOME EXERCISE PROGRAM: Access Code: [email or web address] URL: [email or web address] Date: 12/17/2023 Prepared by: [facility] - Outpatient  Rehab - [facility]  Exercises - Sit to Stand  - 1 x daily - 7 x weekly - 3 sets - 5 reps - Seated March  - 1 x daily - 7 x weekly - 3 sets - 10 reps - Seated Long Arc Quad  - 1 x daily - 7 x weekly - 3 sets - 10 reps - 3 sec hold - Seated Ankle Dorsiflexion AROM  - 1 x daily - 7 x weekly - 3 sets - 10 reps - 3 sec hold  GOALS: Goals reviewed with patient? Yes  SHORT TERM GOALS: Target date: 01/15/2024  Pt will be independent with HEP for improved strength, balance, gait. Baseline: Goal status: IN PROGRESS  2.  Pt will improve 5x sit<>stand to less than or equal to 15 sec to demonstrate improved functional strength and transfer efficiency. Baseline: 20.22 sec Goal status: IN PROGRESS  3.  Pt will improve TUG score to less than or equal to 20 sec for decreased fall risk. Baseline: 29.72 sec Goal  status: IN PROGRESS  LONG TERM GOALS: Target date: 02/12/2024  Pt will be independent with progression of HEP for improved balance, gait, strength. Baseline:  Goal status: IN PROGRESS  2.  Pt will improve gait velocity to at least 2.6 ft/sec for improved gait efficiency and safety. Baseline: 1.64 ft/sec Goal status: IN PROGRESS  3.  Pt will improve TUG score to less than or equal to 15 sec for decreased fall risk. Baseline: 29.72 sec Goal status: IN PROGRESS  4.  Pt will perform 9M Walk test Backwards in < or equal to 10 seconds for improved balance in posterior direction. Baseline: 19 sec Goal status: IN PROGRESS  5.  Pt will verbalize plans for continued community fitness upon d/c from PT to maximize gains made in PT. Baseline:  Goal status: IN PROGRESS  ASSESSMENT:  CLINICAL IMPRESSION: Pt presents today with no new complaints; does report doing his exercises at home. Skilled PT session focused on review of HEP  plus additional sit to stand practice and balance exercises.  Utilized ankle weights for to work on increased amplitude of movement patterns; pt fatigues and has more bradykinesia, smaller amplitude movements mid-way through most sets of exercises. Added to HEP to address balance with weightshifting and foot clearance.  Pt needs UE for stability and safety. Pt will continue to benefit from skilled PT towards goals for improved functional mobility and decreased fall risk.   OBJECTIVE IMPAIRMENTS: Abnormal gait, decreased balance, decreased mobility, difficulty walking, decreased strength, impaired flexibility, and postural dysfunction.   ACTIVITY LIMITATIONS: standing, transfers, and locomotion level  PARTICIPATION LIMITATIONS: shopping, community activity, and yard work  PERSONAL FACTORS: 3+ comorbidities: see PMH  are also affecting patient's functional outcome.   REHAB POTENTIAL: Good  CLINICAL DECISION MAKING: Evolving/moderate complexity  EVALUATION COMPLEXITY: Moderate  PLAN:  PT FREQUENCY: 2x/week  PT DURATION: 8 weeks plus eval visit   PLANNED INTERVENTIONS: 97110-Therapeutic exercises, 97530- Therapeutic activity, [id number]- Neuromuscular re-education, 97535- Self Care, 16109- Manual therapy, [id number]- Gait training, Patient/Family education, Balance training, and DME instructions  PLAN FOR NEXT SESSION: Review HEP updates, ask about ankle weights.  progress exercises for home; activities to increase step length/foot clearance.  Discuss cane/walker use to help with gait pattern(?)   Maicie Vanderloop W., PT 12/28/2023, 4:35 PM  [facility] at [facility] [address] [place], [address] [place], [place] [address] Phone # [phone] Fax # [phone]

## 2023-12-29 ENCOUNTER — Ambulatory Visit: Payer: Medicare PPO

## 2023-12-30 ENCOUNTER — Ambulatory Visit: Payer: Medicare PPO | Admitting: Physical Therapy

## 2023-12-31 ENCOUNTER — Ambulatory Visit: Payer: Medicare PPO

## 2024-01-04 ENCOUNTER — Encounter: Payer: Self-pay | Admitting: Physical Therapy

## 2024-01-04 ENCOUNTER — Ambulatory Visit: Payer: Medicare PPO | Admitting: Physical Therapy

## 2024-01-04 DIAGNOSIS — R29818 Other symptoms and signs involving the nervous system: Secondary | ICD-10-CM | POA: Diagnosis not present

## 2024-01-04 DIAGNOSIS — R2689 Other abnormalities of gait and mobility: Secondary | ICD-10-CM | POA: Diagnosis not present

## 2024-01-04 DIAGNOSIS — M6281 Muscle weakness (generalized): Secondary | ICD-10-CM | POA: Diagnosis not present

## 2024-01-04 DIAGNOSIS — R2681 Unsteadiness on feet: Secondary | ICD-10-CM | POA: Diagnosis not present

## 2024-01-04 NOTE — Therapy (Signed)
 OUTPATIENT PHYSICAL THERAPY NEURO TREATMENT   Patient Name: Scott Wang MRN: [id number] DOB:1941/07/13, 83 y.o., male Today's Date: 01/04/2024   PCP: [name], DO  REFERRING PROVIDER: Sheran Luz, MD   END OF SESSION:  PT End of Session - 01/04/24 1025     Visit Number 5    Number of Visits 17    Date for PT Re-Evaluation 02/12/24    Authorization Type [employer]-auth requested at eval    Authorization Time Period approved 17 PT visits from 12/17/2023-02/12/2024    Authorization - Visit Number 5    Authorization - Number of Visits 17    Progress Note Due on Visit 10    PT Start Time 1021    PT Stop Time 1100    PT Time Calculation (min) 39 min    Equipment Utilized During Treatment Gait belt    Activity Tolerance Patient tolerated treatment well    Behavior During Therapy WFL for tasks assessed/performed               Past Medical History:  Diagnosis Date   Carpal tunnel syndrome    bilateral, had shots in each one and "haven't had any problems since".   Diabetes mellitus    Hyperlipidemia    Hypertension    Prostate cancer (HCC)    Spinal stenosis    Past Surgical History:  Procedure Laterality Date   HERNIA REPAIR     HERNIA REPAIR     INGUINAL HERNIA REPAIR     KNEE SURGERY  right   KNEE SURGERY Left    PROSTATE BIOPSY     RADIOACTIVE SEED IMPLANT N/A 09/07/2018   Procedure: RADIOACTIVE SEED IMPLANT/BRACHYTHERAPY IMPLANT;  Surgeon: Jerilee Field, MD;  Location: [facility];  Service: Urology;  Laterality: N/A;   SPACE OAR INSTILLATION N/A 09/07/2018   Procedure: SPACE OAR INSTILLATION;  Surgeon: Jerilee Field, MD;  Location: [facility];  Service: Urology;  Laterality: N/A;   Patient Active Problem List   Diagnosis Date Noted   AKI (acute kidney injury) (HCC) 09/10/2023   Orthostatic hypotension 11/20/2021   not drinking enough fluids 11/20/2021   Imbalance 11/20/2021   Memory loss 11/20/2021    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   Muscle stiffness due to parkinson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  Sometimes getting up is easier.   Pt accompanied by: significant other, wife in lobby  PERTINENT HISTORY: BLE pain, severe spinal stenosis L4-5, L3-4, Parkinson's, R hip pain, BLE lymphedema  PAIN:  Are you having pain? No  PRECAUTIONS: Fall  RED FLAGS: None   WEIGHT BEARING RESTRICTIONS: No  FALLS: Has patient fallen in last 6 months? Yes. Number of falls 3  LIVING ENVIRONMENT: Lives with: lives with their spouse Lives in: House/apartment Stairs: Yes: External: 2-3 steps; can reach both Has following equipment at home: Single point cane, Walker - 2 wheeled, and Wheelchair (manual)  PLOF: Independent  PATIENT GOALS: Pt wants to get back to normal.  OBJECTIVE:    TODAY'S TREATMENT: 01/04/2024 Activity Comments  NuStep, Level 3, 4 extremities x 8 minutes 2 min warm up >70 SPM 30 sec intervals >90 SPM 1 min intervals 80's SPM  Sit to stand, 3 x 5 reps at 18" chair, hands at knees,  Cues for forward lean and upright posture-used visual cue on floorfor increased forward  lean  Side step and weightshift x 10 Back step and weightshift x 10 Forward step and weightshift x 10 At counter  Forward step over obstacle x 10, side step over obstacle x 10 Cues for increased step height and foot clearance  Mini squats x 10 Mini squats to up on toes x 10   Standing on incline EO, EC 30 sec, 2 reps Min guard/min assist-posterior bias; cues to return to upright midline using hip/ankle strategy        HOME EXERCISE PROGRAM:  Access Code: [email or web address] URL: [email or web address] Date: 12/28/2023 Prepared by: [facility] - Outpatient  Rehab - [facility]  Exercises - Sit to Stand  - 1 x daily - 7 x weekly - 3 sets - 5 reps - Seated March  - 1 x daily - 7 x weekly - 3 sets - 10 reps - Seated Long Arc Quad  - 1 x daily - 7 x weekly - 3 sets - 10 reps - 3 sec hold - Seated Ankle Dorsiflexion AROM  - 1 x daily - 7 x weekly - 3 sets - 10 reps - 3 sec hold - Standing Gastroc Stretch at Counter  - 1 x daily - 7 x weekly - 1 sets - 3 reps - 30 sec hold - Side Stepping with Counter Support  - 1 x daily - 5 x weekly - 2 sets - 10 reps - Alternating Step Backward with Support  - 1 x daily - 5 x weekly - 2 sets - 10 reps - Alternating Step Taps with Counter Support  - 1 x daily - 5 x weekly - 2 sets - 10 reps    PATIENT EDUCATION: Education details: HEP additions, try to get some ankle weights for home Person educated: Patient Education method: Explanation, Demonstration, and Handouts Education comprehension: verbalized understanding, returned demonstration, and needs further education   --------------------------------------------------------------------------------------------------------------- Note: Objective measures were completed at Evaluation unless otherwise noted.  DIAGNOSTIC FINDINGS: NA  COGNITION: Overall cognitive status: Within functional limits for tasks assessed   POSTURE: rounded shoulders, forward head, and bilat UT tremors  LOWER  EXTREMITY ROM:     Active  Right Eval Left Eval  Hip flexion    Hip extension    Hip abduction    Hip adduction    Hip internal rotation    Hip external rotation    Knee flexion  Knee extension -10 -12  Ankle dorsiflexion    Ankle plantarflexion    Ankle inversion    Ankle eversion     (Blank rows = not tested)  LOWER EXTREMITY MMT:    MMT Right Eval Left Eval  Hip flexion 4 4  Hip extension    Hip abduction 4 4  Hip adduction 4 4  Hip internal rotation    Hip external rotation    Knee flexion 4 4  Knee extension 4 4  Ankle dorsiflexion 3+ 3+  Ankle plantarflexion    Ankle inversion    Ankle eversion    (Blank rows = not tested)   TRANSFERS: Assistive device utilized: None  Sit to stand: SBA Stand to sit: SBA   GAIT: Gait pattern: step to pattern, step through pattern, decreased arm swing- Right, decreased arm swing- Left, decreased step length- Right, decreased step length- Left, shuffling, poor foot clearance- Right, and poor foot clearance- Left Distance walked: 50 ft x 2 Assistive device utilized: None Level of assistance: SBA Comments: increased BUE tremor, shuffling gait pattern, forward flexed posture  FUNCTIONAL TESTS:  5 times sit to stand: 20.22 sec Timed up and go (TUG): 29.72 sec 10 meter walk test: 19.97 sec = 1.64 ft/sec 360 turn:   R 12 steps   L 9 steps 57M backward walk:  19.93 sec TUG cognitive:30.89 sec TUG manual:  23.5 sec                                                                                                                               TREATMENT DATE: 12/17/2023    PATIENT EDUCATION: Education details: Eval results, POC, HEP initiated Person educated: Patient Education method: Explanation, Demonstration, and Handouts Education comprehension: verbalized understanding  HOME EXERCISE PROGRAM: Access Code: [email or web address] URL: [email or web address] Date: 12/17/2023 Prepared by: [facility] - Outpatient  Rehab -  [facility]  Exercises - Sit to Stand  - 1 x daily - 7 x weekly - 3 sets - 5 reps - Seated March  - 1 x daily - 7 x weekly - 3 sets - 10 reps - Seated Long Arc Quad  - 1 x daily - 7 x weekly - 3 sets - 10 reps - 3 sec hold - Seated Ankle Dorsiflexion AROM  - 1 x daily - 7 x weekly - 3 sets - 10 reps - 3 sec hold  GOALS: Goals reviewed with patient? Yes  SHORT TERM GOALS: Target date: 01/15/2024  Pt will be independent with HEP for improved strength, balance, gait. Baseline: Goal status: IN PROGRESS  2.  Pt will improve 5x sit<>stand to less than or equal to 15 sec to demonstrate improved functional strength and transfer efficiency. Baseline: 20.22 sec Goal status: IN PROGRESS  3.  Pt will improve TUG score to less than or equal to 20 sec for decreased fall risk. Baseline: 29.72 sec Goal status: IN PROGRESS  LONG TERM GOALS: Target date:  02/12/2024  Pt will be independent with progression of HEP for improved balance, gait, strength. Baseline:  Goal status: IN PROGRESS  2.  Pt will improve gait velocity to at least 2.6 ft/sec for improved gait efficiency and safety. Baseline: 1.64 ft/sec Goal status: IN PROGRESS  3.  Pt will improve TUG score to less than or equal to 15 sec for decreased fall risk. Baseline: 29.72 sec Goal status: IN PROGRESS  4.  Pt will perform 22M Walk test Backwards in < or equal to 10 seconds for improved balance in posterior direction. Baseline: 19 sec Goal status: IN PROGRESS  5.  Pt will verbalize plans for continued community fitness upon d/c from PT to maximize gains made in PT. Baseline:  Goal status: IN PROGRESS  ASSESSMENT:  CLINICAL IMPRESSION: Pt presents today with no new complaints. Skilled PT session focused on aerobic warm up, functional strengthening and standing balance exercises.  With work on incline surface, with eyes open and eyes closed, pt has increased posterior lean, with decreased use of hip/ankle strategy until PT  provides tactile and verbal cues.  Pt needs UE support for most of the standing exercises. When obstacles added for stepping activity, he has initial decreased foot clearance and is able to correct through most of the remainder of exercise set for better foot clearance with higher amplitude movement pattern. Pt will continue to benefit from skilled PT towards goals for improved functional mobility and decreased fall risk.   OBJECTIVE IMPAIRMENTS: Abnormal gait, decreased balance, decreased mobility, difficulty walking, decreased strength, impaired flexibility, and postural dysfunction.   ACTIVITY LIMITATIONS: standing, transfers, and locomotion level  PARTICIPATION LIMITATIONS: shopping, community activity, and yard work  PERSONAL FACTORS: 3+ comorbidities: see PMH  are also affecting patient's functional outcome.   REHAB POTENTIAL: Good  CLINICAL DECISION MAKING: Evolving/moderate complexity  EVALUATION COMPLEXITY: Moderate  PLAN:  PT FREQUENCY: 2x/week  PT DURATION: 8 weeks plus eval visit   PLANNED INTERVENTIONS: 97110-Therapeutic exercises, 97530- Therapeutic activity, [id number]- Neuromuscular re-education, 97535- Self Care, [id number]- Manual therapy, [id number]- Gait training, Patient/Family education, Balance training, and DME instructions  PLAN FOR NEXT SESSION: Ask about ankle weights.  progress exercises for home; activities to increase step length/foot clearance; compliant surfaces.  Discuss cane/walker use to help with gait pattern(?)  Pt reports he is having some vertigo and dizziness upon getting up from the bed-may need to assess   Davarious Tumbleson W., PT 01/04/2024, 2:20 PM  [facility] at [facility] [address] [place], [place] [address] Phone # [phone] Fax # [phone]

## 2024-01-06 ENCOUNTER — Encounter: Payer: Self-pay | Admitting: Physical Therapy

## 2024-01-06 ENCOUNTER — Ambulatory Visit: Payer: Medicare PPO | Admitting: Physical Therapy

## 2024-01-06 DIAGNOSIS — N179 Acute kidney failure, unspecified: Secondary | ICD-10-CM | POA: Diagnosis not present

## 2024-01-06 DIAGNOSIS — R2689 Other abnormalities of gait and mobility: Secondary | ICD-10-CM

## 2024-01-06 DIAGNOSIS — M6281 Muscle weakness (generalized): Secondary | ICD-10-CM

## 2024-01-06 DIAGNOSIS — R2681 Unsteadiness on feet: Secondary | ICD-10-CM

## 2024-01-06 DIAGNOSIS — R29818 Other symptoms and signs involving the nervous system: Secondary | ICD-10-CM | POA: Diagnosis not present

## 2024-01-06 DIAGNOSIS — G20C Parkinsonism, unspecified: Secondary | ICD-10-CM | POA: Diagnosis not present

## 2024-01-06 DIAGNOSIS — R278 Other lack of coordination: Secondary | ICD-10-CM | POA: Diagnosis not present

## 2024-01-06 DIAGNOSIS — R1310 Dysphagia, unspecified: Secondary | ICD-10-CM | POA: Diagnosis not present

## 2024-01-06 DIAGNOSIS — E222 Syndrome of inappropriate secretion of antidiuretic hormone: Secondary | ICD-10-CM | POA: Diagnosis not present

## 2024-01-06 DIAGNOSIS — A419 Sepsis, unspecified organism: Secondary | ICD-10-CM | POA: Diagnosis not present

## 2024-01-06 NOTE — Therapy (Signed)
 OUTPATIENT PHYSICAL THERAPY NEURO TREATMENT   Patient Name: Scott Wang MRN: [id number] DOB:08-Mar-1941, 83 y.o., male Today's Date: 01/06/2024   PCP: [name], DO  REFERRING PROVIDER: Sheran Luz, MD   END OF SESSION:  PT End of Session - 01/06/24 1018     Visit Number 6    Number of Visits 17    Date for PT Re-Evaluation 02/12/24    Authorization Type [employer]-auth requested at eval    Authorization Time Period approved 17 PT visits from 12/17/2023-02/12/2024    Authorization - Visit Number 6    Authorization - Number of Visits 17    Progress Note Due on Visit 10    PT Start Time 1018    PT Stop Time 1100    PT Time Calculation (min) 42 min    Equipment Utilized During Treatment Gait belt    Activity Tolerance Patient tolerated treatment well    Behavior During Therapy WFL for tasks assessed/performed                Past Medical History:  Diagnosis Date   Carpal tunnel syndrome    bilateral, had shots in each one and "haven't had any problems since".   Diabetes mellitus    Hyperlipidemia    Hypertension    Prostate cancer (HCC)    Spinal stenosis    Past Surgical History:  Procedure Laterality Date   HERNIA REPAIR     HERNIA REPAIR     INGUINAL HERNIA REPAIR     KNEE SURGERY  right   KNEE SURGERY Left    PROSTATE BIOPSY     RADIOACTIVE SEED IMPLANT N/A 09/07/2018   Procedure: RADIOACTIVE SEED IMPLANT/BRACHYTHERAPY IMPLANT;  Surgeon: Jerilee Field, MD;  Location: [facility];  Service: Urology;  Laterality: N/A;   SPACE OAR INSTILLATION N/A 09/07/2018   Procedure: SPACE OAR INSTILLATION;  Surgeon: Jerilee Field, MD;  Location: [facility];  Service: Urology;  Laterality: N/A;   Patient Active Problem List   Diagnosis Date Noted   AKI (acute kidney injury) (HCC) 09/10/2023   Orthostatic hypotension 11/20/2021   not drinking enough fluids 11/20/2021   Imbalance 11/20/2021   Memory loss 11/20/2021    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   Muscle stiffness due to parkinson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;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   Parkinson's disease (HCC) 12/02/2020   Malignant neoplasm of prostate (HCC) 05/05/2018   Hypertension 06/10/2011   Hyperlipidemia 06/10/2011   Diabetes mellitus (HCC) 06/10/2011   Onychomycosis 06/10/2011   Paronychia 06/10/2011    ONSET DATE: 12/11/2023  REFERRING DIAG: R26.9 (ICD-10-CM) - Abnormality of gait and mobility   THERAPY DIAG:  Unsteadiness on feet  Other abnormalities of gait and mobility  Muscle weakness (generalized)  Other symptoms and signs involving the nervous system  Rationale for Evaluation and Treatment: Rehabilitation  SUBJECTIVE:  SUBJECTIVE STATEMENT: Brought in my new ankle weights to see if they are right.  Do my exercises everyday.   Pt accompanied by: significant other, wife in lobby  PERTINENT HISTORY: BLE pain, severe spinal stenosis L4-5, L3-4, Parkinson's, R hip pain, BLE lymphedema  PAIN:  Are you having pain? No  PRECAUTIONS: Fall  RED FLAGS: None   WEIGHT BEARING RESTRICTIONS: No  FALLS: Has patient fallen in last 6 months? Yes. Number of falls 3  LIVING ENVIRONMENT: Lives with: lives with their spouse Lives in: House/apartment Stairs: Yes: External: 2-3 steps; can reach both Has following equipment at home: Single point cane, Walker - 2 wheeled, and Wheelchair (manual)  PLOF: Independent  PATIENT GOALS: Pt wants to get back to normal.  OBJECTIVE:    TODAY'S TREATMENT: 01/06/2024 Activity Comments  NuStep, Level 3/6, 4 extremities x 8 minutes 2 min warm up Light/heavy 30-60 sec intervals  Marching in place 2 x 10 3#, tactile cues for increased lift of BLEs  Standing hip abduction  x 10, then side step and weightshift 3#  Standing hip extension x 10 then back step and weightshift 3#  Standing on incline-forward /back step and weightshift BUE support  Heel/toe raises 2 x 10   A/P hip bumps for hip strategy 2 x10 with cues  Forward/back walking in parallel bars, intermittent UE support Cues for slowed pace, increased step length and foot clearance backwards  Standing on incline EO, EC 30 sec, 2 reps Min guard-posterior bias; cues to return to upright midline using hip/ankle strategy     HOME EXERCISE PROGRAM:  Access Code: [email or web address] URL: [email or web address] Date: 12/28/2023 Prepared by: [facility] - Outpatient  Rehab - [facility]  Exercises - Sit to Stand  - 1 x daily - 7 x weekly - 3 sets - 5 reps - Seated March  - 1 x daily - 7 x weekly - 3 sets - 10 reps - Seated Long Arc Quad  - 1 x daily - 7 x weekly - 3 sets - 10 reps - 3 sec hold - Seated Ankle Dorsiflexion AROM  - 1 x daily - 7 x weekly - 3 sets - 10 reps - 3 sec hold - Standing Gastroc Stretch at Counter  - 1 x daily - 7 x weekly - 1 sets - 3 reps - 30 sec hold - Side Stepping with Counter Support  - 1 x daily - 5 x weekly - 2 sets - 10 reps - Alternating Step Backward with Support  - 1 x daily - 5 x weekly - 2 sets - 10 reps - Alternating Step Taps with Counter Support  - 1 x daily - 5 x weekly - 2 sets - 10 reps    PATIENT EDUCATION: Education details: Ankle weight instruction (he brought in 5# adjustable weights and PT took out to decrease to 3# bilaterally)-educated to use weights for marching in place, side and back hip kicks (that he is already doing at home) Person educated: Patient Education method: Explanation and Demonstration Education comprehension: verbalized understanding, returned demonstration, and needs further education   --------------------------------------------------------------------------------------------------------------- Note: Objective measures were  completed at Evaluation unless otherwise noted.  DIAGNOSTIC FINDINGS: NA  COGNITION: Overall cognitive status: Within functional limits for tasks assessed   POSTURE: rounded shoulders, forward head, and bilat UT tremors  LOWER EXTREMITY ROM:     Active  Right Eval Left Eval  Hip flexion    Hip extension    Hip abduction    Hip  adduction    Hip internal rotation    Hip external rotation    Knee flexion    Knee extension -10 -12  Ankle dorsiflexion    Ankle plantarflexion    Ankle inversion    Ankle eversion     (Blank rows = not tested)  LOWER EXTREMITY MMT:    MMT Right Eval Left Eval  Hip flexion 4 4  Hip extension    Hip abduction 4 4  Hip adduction 4 4  Hip internal rotation    Hip external rotation    Knee flexion 4 4  Knee extension 4 4  Ankle dorsiflexion 3+ 3+  Ankle plantarflexion    Ankle inversion    Ankle eversion    (Blank rows = not tested)   TRANSFERS: Assistive device utilized: None  Sit to stand: SBA Stand to sit: SBA   GAIT: Gait pattern: step to pattern, step through pattern, decreased arm swing- Right, decreased arm swing- Left, decreased step length- Right, decreased step length- Left, shuffling, poor foot clearance- Right, and poor foot clearance- Left Distance walked: 50 ft x 2 Assistive device utilized: None Level of assistance: SBA Comments: increased BUE tremor, shuffling gait pattern, forward flexed posture  FUNCTIONAL TESTS:  5 times sit to stand: 20.22 sec Timed up and go (TUG): 29.72 sec 10 meter walk test: 19.97 sec = 1.64 ft/sec 360 turn:   R 12 steps   L 9 steps 57M backward walk:  19.93 sec TUG cognitive:30.89 sec TUG manual:  23.5 sec                                                                                                                               TREATMENT DATE: 12/17/2023    PATIENT EDUCATION: Education details: Eval results, POC, HEP initiated Person educated: Patient Education method:  Explanation, Demonstration, and Handouts Education comprehension: verbalized understanding  HOME EXERCISE PROGRAM: Access Code: [email or web address] URL: [email or web address] Date: 12/17/2023 Prepared by: [facility] - Outpatient  Rehab - [facility]  Exercises - Sit to Stand  - 1 x daily - 7 x weekly - 3 sets - 5 reps - Seated March  - 1 x daily - 7 x weekly - 3 sets - 10 reps - Seated Long Arc Quad  - 1 x daily - 7 x weekly - 3 sets - 10 reps - 3 sec hold - Seated Ankle Dorsiflexion AROM  - 1 x daily - 7 x weekly - 3 sets - 10 reps - 3 sec hold  GOALS: Goals reviewed with patient? Yes  SHORT TERM GOALS: Target date: 01/15/2024  Pt will be independent with HEP for improved strength, balance, gait. Baseline: Goal status: IN PROGRESS  2.  Pt will improve 5x sit<>stand to less than or equal to 15 sec to demonstrate improved functional strength and transfer efficiency. Baseline: 20.22 sec Goal status: IN PROGRESS  3.  Pt will improve TUG score to less than or  equal to 20 sec for decreased fall risk. Baseline: 29.72 sec Goal status: IN PROGRESS  LONG TERM GOALS: Target date: 02/12/2024  Pt will be independent with progression of HEP for improved balance, gait, strength. Baseline:  Goal status: IN PROGRESS  2.  Pt will improve gait velocity to at least 2.6 ft/sec for improved gait efficiency and safety. Baseline: 1.64 ft/sec Goal status: IN PROGRESS  3.  Pt will improve TUG score to less than or equal to 15 sec for decreased fall risk. Baseline: 29.72 sec Goal status: IN PROGRESS  4.  Pt will perform 75M Walk test Backwards in < or equal to 10 seconds for improved balance in posterior direction. Baseline: 19 sec Goal status: IN PROGRESS  5.  Pt will verbalize plans for continued community fitness upon d/c from PT to maximize gains made in PT. Baseline:  Goal status: IN PROGRESS  ASSESSMENT:  CLINICAL IMPRESSION: Pt presents today with no new complaints; he does  bring in his new ankle weights. Skilled PT session focused on strengthening and balance work.  He demonstrates less postural sway with EC and standing on incline activities today compared to last visit.  Pt needs tactile cues to increase step length and height of marching for improved SLS time and foot clearance. Pt will continue to benefit from skilled PT towards goals for improved functional mobility and decreased fall risk.   OBJECTIVE IMPAIRMENTS: Abnormal gait, decreased balance, decreased mobility, difficulty walking, decreased strength, impaired flexibility, and postural dysfunction.   ACTIVITY LIMITATIONS: standing, transfers, and locomotion level  PARTICIPATION LIMITATIONS: shopping, community activity, and yard work  PERSONAL FACTORS: 3+ comorbidities: see PMH  are also affecting patient's functional outcome.   REHAB POTENTIAL: Good  CLINICAL DECISION MAKING: Evolving/moderate complexity  EVALUATION COMPLEXITY: Moderate  PLAN:  PT FREQUENCY: 2x/week  PT DURATION: 8 weeks plus eval visit   PLANNED INTERVENTIONS: 97110-Therapeutic exercises, 97530- Therapeutic activity, 97112- Neuromuscular re-education, 97535- Self Care, 65784- Manual therapy, [phone]- Gait training, Patient/Family education, Balance training, and DME instructions  PLAN FOR NEXT SESSION: Progress exercises for home; activities to increase step length/foot clearance; compliant surfaces.  Discuss cane/walker use to help with gait pattern(?)  Pt reports he is having some vertigo and dizziness upon getting up from the bed-may need to assess   Kaleen Rochette W., PT 01/06/2024, 11:01 AM  [facility] at [facility] [address], [address] [place], [place] [address] Phone # [phone] Fax # [phone]

## 2024-01-12 ENCOUNTER — Encounter: Payer: Self-pay | Admitting: Physical Therapy

## 2024-01-12 ENCOUNTER — Ambulatory Visit: Payer: Medicare PPO | Admitting: Physical Therapy

## 2024-01-12 ENCOUNTER — Ambulatory Visit: Payer: Medicare PPO | Attending: Physical Medicine and Rehabilitation | Admitting: Physical Therapy

## 2024-01-12 DIAGNOSIS — R42 Dizziness and giddiness: Secondary | ICD-10-CM | POA: Insufficient documentation

## 2024-01-12 DIAGNOSIS — M6281 Muscle weakness (generalized): Secondary | ICD-10-CM | POA: Diagnosis not present

## 2024-01-12 DIAGNOSIS — R29818 Other symptoms and signs involving the nervous system: Secondary | ICD-10-CM | POA: Insufficient documentation

## 2024-01-12 DIAGNOSIS — R2681 Unsteadiness on feet: Secondary | ICD-10-CM | POA: Diagnosis not present

## 2024-01-12 DIAGNOSIS — R2689 Other abnormalities of gait and mobility: Secondary | ICD-10-CM | POA: Insufficient documentation

## 2024-01-12 NOTE — Therapy (Signed)
 OUTPATIENT PHYSICAL THERAPY NEURO TREATMENT   Patient Name: Scott Wang MRN: [id number] DOB:August 02, 1941, 83 y.o., male Today's Date: 01/12/2024   PCP: [name], DO  REFERRING PROVIDER: Sheran Luz, MD   END OF SESSION:  PT End of Session - 01/12/24 1102     Visit Number 7    Number of Visits 17    Date for PT Re-Evaluation 02/12/24    Authorization Type [employer]-auth requested at eval    Authorization Time Period approved 17 PT visits from 12/17/2023-02/12/2024    Authorization - Visit Number 7    Authorization - Number of Visits 17    Progress Note Due on Visit 10    PT Start Time 1104    PT Stop Time 1146    PT Time Calculation (min) 42 min    Equipment Utilized During Treatment Gait belt    Activity Tolerance Patient tolerated treatment well    Behavior During Therapy WFL for tasks assessed/performed                 Past Medical History:  Diagnosis Date   Carpal tunnel syndrome    bilateral, had shots in each one and "haven't had any problems since".   Diabetes mellitus    Hyperlipidemia    Hypertension    Prostate cancer (HCC)    Spinal stenosis    Past Surgical History:  Procedure Laterality Date   HERNIA REPAIR     HERNIA REPAIR     INGUINAL HERNIA REPAIR     KNEE SURGERY  right   KNEE SURGERY Left    PROSTATE BIOPSY     RADIOACTIVE SEED IMPLANT N/A 09/07/2018   Procedure: RADIOACTIVE SEED IMPLANT/BRACHYTHERAPY IMPLANT;  Surgeon: Jerilee Field, MD;  Location: [facility];  Service: Urology;  Laterality: N/A;   SPACE OAR INSTILLATION N/A 09/07/2018   Procedure: SPACE OAR INSTILLATION;  Surgeon: Jerilee Field, MD;  Location: [facility];  Service: Urology;  Laterality: N/A;   Patient Active Problem List   Diagnosis Date Noted   AKI (acute kidney injury) (HCC) 09/10/2023   Orthostatic hypotension 11/20/2021   not drinking enough fluids 11/20/2021   Imbalance 11/20/2021   Memory loss 11/20/2021    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   Muscle stiffness due to parkinson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   Pt accompanied by: significant other, wife in lobby  PERTINENT HISTORY: BLE pain, severe spinal stenosis L4-5, L3-4, Parkinson's, R hip pain, BLE lymphedema  PAIN:  Are you having pain? No  PRECAUTIONS: Fall  RED FLAGS: None   WEIGHT BEARING RESTRICTIONS: No  FALLS: Has patient fallen in last 6 months? Yes. Number of falls 3  LIVING ENVIRONMENT: Lives with: lives with their spouse Lives in: House/apartment Stairs: Yes: External: 2-3 steps; can reach both Has following equipment at home: Single point cane, Walker - 2 wheeled, and Wheelchair (manual)  PLOF: Independent  PATIENT GOALS: Pt wants to get back to normal.  OBJECTIVE:    TODAY'S TREATMENT: 01/12/2024 Activity Comments  Gait x 3 minutes, 300 ft, no device Pt with shuffling steps and forward flexed posture; cues to stop and reset x 3 during gait  Gait with cane, 250 ft with min guard  Multiple stops to reset, due to getting off sequence-hastening and shuffling  Sit to stand, resisted green band upon  standing  2 x 10 reps, cues for full upright stand  Forward/back walking 5 reps along counter Tactile and verbal cues to slow pace and lessen hastening in backwards direction  Forward/back walking 5 reps along counter, green theraband resistance forward LOB with transition between fwd/back  Forward alternating step taps 2 x 10 UE support  Forward step taps, then runner's stretch position x 10 Cues for increased intensity for forward foot clearance to tap  Additional gait-clinic distances, no device, min guard Cues for posture, step length, deliberate foot clearance      HOME EXERCISE PROGRAM:  Access Code: [email or web address] URL: [email or web address] Date: 12/28/2023 Prepared by: [facility] - Outpatient  Rehab - [facility]  *VERBALLY ADDED:  Walking 2 minutes in hallway or laps in home, 3x/day with focus on POSTURE, step length/foot clearance-01/12/2024  Exercises - Sit to Stand  - 1 x daily - 7 x weekly - 3 sets - 5 reps - Seated March  - 1 x daily - 7 x weekly - 3 sets - 10 reps - Seated Long Arc Quad  - 1 x daily - 7 x weekly - 3 sets - 10 reps - 3 sec hold - Seated Ankle Dorsiflexion AROM  - 1 x daily - 7 x weekly - 3 sets - 10 reps - 3 sec hold - Standing Gastroc Stretch at Counter  - 1 x daily - 7 x weekly - 1 sets - 3 reps - 30 sec hold - Side Stepping with Counter Support  - 1 x daily - 5 x weekly - 2 sets - 10 reps - Alternating Step Backward with Support  - 1 x daily - 5 x weekly - 2 sets - 10 reps - Alternating Step Taps with Counter Support  - 1 x daily - 5 x weekly - 2 sets - 10 reps    PATIENT EDUCATION: Education details: Verbal addition to HEP:  walking for exercise at home-see above.  Discussed likely need for cane or walker to help slow gait pattern, help with posture and step length.  He does not want to try walker, but tries cane again today in PT session. Person educated: Patient Education method: Medical illustrator Education comprehension:  verbalized understanding, returned demonstration, and needs further education   --------------------------------------------------------------------------------------------------------------- Note: Objective measures were completed at Evaluation unless otherwise noted.  DIAGNOSTIC FINDINGS: NA  COGNITION: Overall cognitive status: Within functional limits for tasks  assessed   POSTURE: rounded shoulders, forward head, and bilat UT tremors  LOWER EXTREMITY ROM:     Active  Right Eval Left Eval  Hip flexion    Hip extension    Hip abduction    Hip adduction    Hip internal rotation    Hip external rotation    Knee flexion    Knee extension -10 -12  Ankle dorsiflexion    Ankle plantarflexion    Ankle inversion    Ankle eversion     (Blank rows = not tested)  LOWER EXTREMITY MMT:    MMT Right Eval Left Eval  Hip flexion 4 4  Hip extension    Hip abduction 4 4  Hip adduction 4 4  Hip internal rotation    Hip external rotation    Knee flexion 4 4  Knee extension 4 4  Ankle dorsiflexion 3+ 3+  Ankle plantarflexion    Ankle inversion    Ankle eversion    (Blank rows = not tested)   TRANSFERS: Assistive device utilized: None  Sit to stand: SBA Stand to sit: SBA   GAIT: Gait pattern: step to pattern, step through pattern, decreased arm swing- Right, decreased arm swing- Left, decreased step length- Right, decreased step length- Left, shuffling, poor foot clearance- Right, and poor foot clearance- Left Distance walked: 50 ft x 2 Assistive device utilized: None Level of assistance: SBA Comments: increased BUE tremor, shuffling gait pattern, forward flexed posture  FUNCTIONAL TESTS:  5 times sit to stand: 20.22 sec Timed up and go (TUG): 29.72 sec 10 meter walk test: 19.97 sec = 1.64 ft/sec 360 turn:   R 12 steps   L 9 steps 91M backward walk:  19.93 sec TUG cognitive:30.89 sec TUG manual:  23.5 sec                                                                                                                                TREATMENT DATE: 12/17/2023    PATIENT EDUCATION: Education details: Eval results, POC, HEP initiated Person educated: Patient Education method: Explanation, Demonstration, and Handouts Education comprehension: verbalized understanding  HOME EXERCISE PROGRAM: Access Code: [email or web address] URL: [email or web address] Date: 12/17/2023 Prepared by: [facility] - Outpatient  Rehab - [facility]  Exercises - Sit to Stand  - 1 x daily - 7 x weekly - 3 sets - 5 reps - Seated March  - 1 x daily - 7 x weekly - 3 sets - 10 reps - Seated Long Arc Quad  - 1 x daily - 7 x weekly - 3 sets - 10 reps - 3 sec hold - Seated Ankle Dorsiflexion AROM  - 1 x daily - 7 x weekly - 3 sets - 10 reps - 3 sec hold  GOALS: Goals reviewed with patient? Yes  SHORT TERM GOALS: Target date: 01/15/2024  Pt will be independent with HEP for improved strength, balance, gait. Baseline: Goal status: IN PROGRESS  2.  Pt will improve 5x sit<>stand to less than or equal to 15 sec to demonstrate improved functional strength and transfer efficiency. Baseline: 20.22 sec Goal status: IN PROGRESS  3.  Pt will improve TUG score to less than or equal to 20 sec for decreased fall risk. Baseline: 29.72 sec Goal status: IN PROGRESS  LONG TERM GOALS: Target date: 02/12/2024  Pt will be independent with progression of HEP for improved balance, gait, strength. Baseline:  Goal status: IN PROGRESS  2.  Pt will improve gait velocity to at least 2.6 ft/sec for improved gait efficiency and safety. Baseline: 1.64 ft/sec Goal status: IN PROGRESS  3.  Pt will improve TUG score to less than or equal to 15 sec for decreased fall risk. Baseline: 29.72 sec Goal status: IN PROGRESS  4.  Pt will perform 91M Walk test Backwards in < or equal to 10 seconds for improved balance in posterior direction. Baseline: 19 sec Goal status: IN PROGRESS  5.  Pt will verbalize  plans for continued community fitness upon d/c from PT to maximize gains made in PT. Baseline:  Goal status: IN PROGRESS  ASSESSMENT:  CLINICAL IMPRESSION: Pt presents today with no new complaints, though he does state he is fearful of falling and this is limiting his activity level outside of the home. Skilled PT session focused on gait training with and without cane. Pt needs brief standing breaks during gait with PT cues to reset posture and start again with BIG step length, best posture and foot clearance.  He reverts to shorter, shuffling steps, both with and without cane.  Worked on resisted stepping, gait, sit to stands to help facilitate increased amplitude and intensity of movement patterns, and pt still seems to revert to smaller patterns about 75% through each activity, needing verbal cues to re-increase intensity.  Pt will continue to benefit from skilled PT towards goals for improved functional mobility and decreased fall risk.   OBJECTIVE IMPAIRMENTS: Abnormal gait, decreased balance, decreased mobility, difficulty walking, decreased strength, impaired flexibility, and postural dysfunction.   ACTIVITY LIMITATIONS: standing, transfers, and locomotion level  PARTICIPATION LIMITATIONS: shopping, community activity, and yard work  PERSONAL FACTORS: 3+ comorbidities: see PMH  are also affecting patient's functional outcome.   REHAB POTENTIAL: Good  CLINICAL DECISION MAKING: Evolving/moderate complexity  EVALUATION COMPLEXITY: Moderate  PLAN:  PT FREQUENCY: 2x/week  PT DURATION: 8 weeks plus eval visit   PLANNED INTERVENTIONS: 97110-Therapeutic exercises, 97530- Therapeutic activity, 97112- Neuromuscular re-education, 97535- Self Care, 82956- Manual therapy, [phone]- Gait training, Patient/Family education, Balance training, and DME instructions  PLAN FOR NEXT SESSION: Check STGs; ask about walking program.  Progress exercises for home; activities to increase step length/foot  clearance; compliant surfaces.  Discuss cane/walker use to help with gait pattern(?)  Pt reports he is having some vertigo and dizziness upon getting up from the bed-may need to assess   Zephan Beauchaine W., PT 01/12/2024, 11:57 AM  [facility] at [facility] [address] [place], [place] [address] Phone # [phone] Fax # [phone]

## 2024-01-14 ENCOUNTER — Encounter: Payer: Self-pay | Admitting: Physical Therapy

## 2024-01-14 ENCOUNTER — Ambulatory Visit: Payer: Medicare PPO | Admitting: Physical Therapy

## 2024-01-14 ENCOUNTER — Ambulatory Visit: Payer: Medicare PPO

## 2024-01-14 DIAGNOSIS — R2689 Other abnormalities of gait and mobility: Secondary | ICD-10-CM | POA: Diagnosis not present

## 2024-01-14 DIAGNOSIS — R2681 Unsteadiness on feet: Secondary | ICD-10-CM | POA: Diagnosis not present

## 2024-01-14 DIAGNOSIS — M6281 Muscle weakness (generalized): Secondary | ICD-10-CM | POA: Diagnosis not present

## 2024-01-14 DIAGNOSIS — R29818 Other symptoms and signs involving the nervous system: Secondary | ICD-10-CM | POA: Diagnosis not present

## 2024-01-14 DIAGNOSIS — R42 Dizziness and giddiness: Secondary | ICD-10-CM | POA: Diagnosis not present

## 2024-01-14 NOTE — Therapy (Signed)
 OUTPATIENT PHYSICAL THERAPY NEURO TREATMENT   Patient Name: Scott Wang MRN: [id number] DOB:06-18-1941, 83 y.o., male Today's Date: 01/15/2024   PCP: [name], DO  REFERRING PROVIDER: Sheran Luz, MD   END OF SESSION:  PT End of Session - 01/14/24 1452     Visit Number 8    Number of Visits 17    Date for PT Re-Evaluation 02/12/24    Authorization Type [employer]-auth requested at eval    Authorization Time Period approved 17 PT visits from 12/17/2023-02/12/2024    Authorization - Visit Number 8    Authorization - Number of Visits 17    Progress Note Due on Visit 10    PT Start Time 1450    PT Stop Time 1532    PT Time Calculation (min) 42 min    Equipment Utilized During Treatment Gait belt    Activity Tolerance Patient tolerated treatment well    Behavior During Therapy WFL for tasks assessed/performed                  Past Medical History:  Diagnosis Date   Carpal tunnel syndrome    bilateral, had shots in each one and "haven't had any problems since".   Diabetes mellitus    Hyperlipidemia    Hypertension    Prostate cancer (HCC)    Spinal stenosis    Past Surgical History:  Procedure Laterality Date   HERNIA REPAIR     HERNIA REPAIR     INGUINAL HERNIA REPAIR     KNEE SURGERY  right   KNEE SURGERY Left    PROSTATE BIOPSY     RADIOACTIVE SEED IMPLANT N/A 09/07/2018   Procedure: RADIOACTIVE SEED IMPLANT/BRACHYTHERAPY IMPLANT;  Surgeon: Jerilee Field, MD;  Location: [facility];  Service: Urology;  Laterality: N/A;   SPACE OAR INSTILLATION N/A 09/07/2018   Procedure: SPACE OAR INSTILLATION;  Surgeon: Jerilee Field, MD;  Location: [facility];  Service: Urology;  Laterality: N/A;   Patient Active Problem List   Diagnosis Date Noted   AKI (acute kidney injury) (HCC) 09/10/2023   Orthostatic hypotension 11/20/2021   not drinking enough fluids 11/20/2021   Imbalance 11/20/2021   Memory loss 11/20/2021    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   Muscle stiffness due to parkinson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;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   Parkinson's disease (HCC) 12/02/2020   Malignant neoplasm of prostate (HCC) 05/05/2018   Hypertension 06/10/2011   Hyperlipidemia 06/10/2011   Diabetes mellitus (HCC) 06/10/2011   Onychomycosis 06/10/2011   Paronychia 06/10/2011    ONSET DATE: 12/11/2023  REFERRING DIAG: R26.9 (ICD-10-CM) - Abnormality of gait and mobility   THERAPY DIAG:  Unsteadiness on feet  Other abnormalities of gait and mobility  Muscle weakness (generalized)  Rationale for Evaluation and Treatment: Rehabilitation  SUBJECTIVE:  SUBJECTIVE STATEMENT: Have had a busy day today-multiple appointments to take care of in town.  I'm normally taking a nap during this time.   Pt accompanied by: significant other, wife in lobby  PERTINENT HISTORY: BLE pain, severe spinal stenosis L4-5, L3-4, Parkinson's, R hip pain, BLE lymphedema  PAIN:  Are you having pain? No  PRECAUTIONS: Fall  RED FLAGS: None   WEIGHT BEARING RESTRICTIONS: No  FALLS: Has patient fallen in last 6 months? Yes. Number of falls 3  LIVING ENVIRONMENT: Lives with: lives with their spouse Lives in: House/apartment Stairs: Yes: External: 2-3 steps; can reach both Has following equipment at home: Single point cane, Walker - 2 wheeled, and Wheelchair (manual)  PLOF: Independent  PATIENT GOALS: Pt wants to get back to normal.  OBJECTIVE:    TODAY'S TREATMENT: 01/14/2024 Activity Comments  NuStep, Level 3>6 x 10 minutes, 4 extremities 30 sec fast/light intervals, 1 min heavy intervals  FTSTS 16.22 sec Hands at knees/forward  TUG:  10.47 sec, 12.66 sec, 12.03 sec Improved from 29.72 sec  Seated  leg ex: LAQ 2 x 10 Marching in place 2 x 10 Ankle pumps x 10 Step out and in, 2 x 10 3# BLE Pt fatigues mid way through set             HOME EXERCISE PROGRAM:  Access Code: [email or web address] URL: [email or web address] Date: 12/28/2023 Prepared by: [facility] - Outpatient  Rehab - [facility]  *VERBALLY ADDED:  Walking 2 minutes in hallway or laps in home, 3x/day with focus on POSTURE, step length/foot clearance-01/12/2024  Exercises - Sit to Stand  - 1 x daily - 7 x weekly - 3 sets - 5 reps - Seated March  - 1 x daily - 7 x weekly - 3 sets - 10 reps - Seated Long Arc Quad  - 1 x daily - 7 x weekly - 3 sets - 10 reps - 3 sec hold - Seated Ankle Dorsiflexion AROM  - 1 x daily - 7 x weekly - 3 sets - 10 reps - 3 sec hold - Standing Gastroc Stretch at Counter  - 1 x daily - 7 x weekly - 1 sets - 3 reps - 30 sec hold - Side Stepping with Counter Support  - 1 x daily - 5 x weekly - 2 sets - 10 reps - Alternating Step Backward with Support  - 1 x daily - 5 x weekly - 2 sets - 10 reps - Alternating Step Taps with Counter Support  - 1 x daily - 5 x weekly - 2 sets - 10 reps    PATIENT EDUCATION: Education details: Reviewed walking for exercise at home-see above.  Progress towards goals, discussed Sinemet medication timing and consistency Person educated: Patient Education method: Explanation and Demonstration Education comprehension: verbalized understanding, returned demonstration, and needs further education   --------------------------------------------------------------------------------------------------------------- Note: Objective measures were completed at Evaluation unless otherwise noted.  DIAGNOSTIC FINDINGS: NA  COGNITION: Overall cognitive status: Within functional limits for tasks assessed   POSTURE: rounded shoulders, forward head, and bilat UT tremors  LOWER EXTREMITY ROM:     Active  Right Eval Left Eval  Hip flexion    Hip extension    Hip  abduction    Hip adduction    Hip internal rotation    Hip external rotation    Knee flexion    Knee extension -10 -12  Ankle dorsiflexion    Ankle plantarflexion    Ankle inversion    Ankle  eversion     (Blank rows = not tested)  LOWER EXTREMITY MMT:    MMT Right Eval Left Eval  Hip flexion 4 4  Hip extension    Hip abduction 4 4  Hip adduction 4 4  Hip internal rotation    Hip external rotation    Knee flexion 4 4  Knee extension 4 4  Ankle dorsiflexion 3+ 3+  Ankle plantarflexion    Ankle inversion    Ankle eversion    (Blank rows = not tested)   TRANSFERS: Assistive device utilized: None  Sit to stand: SBA Stand to sit: SBA   GAIT: Gait pattern: step to pattern, step through pattern, decreased arm swing- Right, decreased arm swing- Left, decreased step length- Right, decreased step length- Left, shuffling, poor foot clearance- Right, and poor foot clearance- Left Distance walked: 50 ft x 2 Assistive device utilized: None Level of assistance: SBA Comments: increased BUE tremor, shuffling gait pattern, forward flexed posture  FUNCTIONAL TESTS:  5 times sit to stand: 20.22 sec Timed up and go (TUG): 29.72 sec 10 meter walk test: 19.97 sec = 1.64 ft/sec 360 turn:   R 12 steps   L 9 steps 60M backward walk:  19.93 sec TUG cognitive:30.89 sec TUG manual:  23.5 sec                                                                                                                               TREATMENT DATE: 12/17/2023    PATIENT EDUCATION: Education details: Eval results, POC, HEP initiated Person educated: Patient Education method: Explanation, Demonstration, and Handouts Education comprehension: verbalized understanding  HOME EXERCISE PROGRAM: Access Code: [email or web address] URL: [email or web address] Date: 12/17/2023 Prepared by: [facility] - Outpatient  Rehab - [facility]  Exercises - Sit to Stand  - 1 x daily - 7 x weekly - 3 sets - 5  reps - Seated March  - 1 x daily - 7 x weekly - 3 sets - 10 reps - Seated Long Arc Quad  - 1 x daily - 7 x weekly - 3 sets - 10 reps - 3 sec hold - Seated Ankle Dorsiflexion AROM  - 1 x daily - 7 x weekly - 3 sets - 10 reps - 3 sec hold  GOALS: Goals reviewed with patient? Yes  SHORT TERM GOALS: Target date: 01/15/2024  Pt will be independent with HEP for improved strength, balance, gait. Baseline: Goal status: MET per report 01/14/2024  2.  Pt will improve 5x sit<>stand to less than or equal to 15 sec to demonstrate improved functional strength and transfer efficiency. Baseline: 20.22 sec>16.22 sec 01/14/2024 Goal status:PARTIALLY MET 01/15/2024  3.  Pt will improve TUG score to less than or equal to 20 sec for decreased fall risk. Baseline: 29.72 sec>12.66 sec 01/14/2024 Goal status: MET, 01/14/2024  LONG TERM GOALS: Target date: 02/12/2024  Pt will be independent with progression of HEP for improved balance, gait, strength.  Baseline:  Goal status: IN PROGRESS  2.  Pt will improve gait velocity to at least 2.6 ft/sec for improved gait efficiency and safety. Baseline: 1.64 ft/sec Goal status: IN PROGRESS  3.  Pt will improve TUG score to less than or equal to 15 sec for decreased fall risk. Baseline: 29.72 sec Goal status: IN PROGRESS  4.  Pt will perform 61M Walk test Backwards in < or equal to 10 seconds for improved balance in posterior direction. Baseline: 19 sec Goal status: IN PROGRESS  5.  Pt will verbalize plans for continued community fitness upon d/c from PT to maximize gains made in PT. Baseline:  Goal status: IN PROGRESS  ASSESSMENT:  CLINICAL IMPRESSION: Pt presents today with no new complaints; he does report this is a typical time of the day when he is resting. Skilled PT session focused on checking STGs and working on lower extremity strengthening.  FSTST 16.22 sec, TUG score 12.66 sec at best, both improved since eval.  He has met 2 of 3 STGs; STG 2 partially met for  improved FTSTS, just not to goal level.  Discussed with patient some of his motor fluctuations, including times even within a set of exercises of a short burst of activity, that follows with significant bradykinesia and decreased amplitude of movement.  Discussed trying to follow up with neurologist about medication questions, given pt's continued motor fluctuations.   Pt will continue to benefit from skilled PT towards goals for improved functional mobility and decreased fall risk.   OBJECTIVE IMPAIRMENTS: Abnormal gait, decreased balance, decreased mobility, difficulty walking, decreased strength, impaired flexibility, and postural dysfunction.   ACTIVITY LIMITATIONS: standing, transfers, and locomotion level  PARTICIPATION LIMITATIONS: shopping, community activity, and yard work  PERSONAL FACTORS: 3+ comorbidities: see PMH  are also affecting patient's functional outcome.   REHAB POTENTIAL: Good  CLINICAL DECISION MAKING: Evolving/moderate complexity  EVALUATION COMPLEXITY: Moderate  PLAN:  PT FREQUENCY: 2x/week  PT DURATION: 8 weeks plus eval visit   PLANNED INTERVENTIONS: 97110-Therapeutic exercises, 97530- Therapeutic activity, 97112- Neuromuscular re-education, 97535- Self Care, [id number]- Manual therapy, [phone]- Gait training, Patient/Family education, Balance training, and DME instructions  PLAN FOR NEXT SESSION: Progress exercises for home; activities to increase step length/foot clearance; compliant surfaces.  Discuss cane/walker use to help with gait pattern(?)  Pt reports he is having some vertigo and dizziness upon getting up from the bed-may need to assess (he has not reported in several visits, so have not followed up on this)   Gean Maidens., PT 01/15/2024, 8:11 AM  [facility] at [facility] [address] [place], [place] [address] Phone # [phone] Fax # [phone]

## 2024-01-18 ENCOUNTER — Ambulatory Visit: Payer: Medicare PPO | Admitting: Physical Therapy

## 2024-01-18 DIAGNOSIS — R2681 Unsteadiness on feet: Secondary | ICD-10-CM | POA: Diagnosis not present

## 2024-01-18 DIAGNOSIS — R29818 Other symptoms and signs involving the nervous system: Secondary | ICD-10-CM | POA: Diagnosis not present

## 2024-01-18 DIAGNOSIS — M6281 Muscle weakness (generalized): Secondary | ICD-10-CM | POA: Diagnosis not present

## 2024-01-18 DIAGNOSIS — R2689 Other abnormalities of gait and mobility: Secondary | ICD-10-CM | POA: Diagnosis not present

## 2024-01-18 DIAGNOSIS — R42 Dizziness and giddiness: Secondary | ICD-10-CM | POA: Diagnosis not present

## 2024-01-18 NOTE — Therapy (Signed)
 OUTPATIENT PHYSICAL THERAPY NEURO TREATMENT   Patient Name: Scott Wang MRN: [id number] DOB:February 08, 1941, 83 y.o., male Today's Date: 01/18/2024   PCP: [name], DO  REFERRING PROVIDER: Sheran Luz, MD   END OF SESSION:  PT End of Session - 01/18/24 1022     Visit Number 9    Number of Visits 17    Date for PT Re-Evaluation 02/12/24    Authorization Type [employer]-auth requested at eval    Authorization Time Period approved 17 PT visits from 12/17/2023-02/12/2024    Authorization - Visit Number 9    Authorization - Number of Visits 17    Progress Note Due on Visit 10    PT Start Time 1020    PT Stop Time 1100    PT Time Calculation (min) 40 min    Equipment Utilized During Treatment Gait belt    Activity Tolerance Patient tolerated treatment well    Behavior During Therapy WFL for tasks assessed/performed                  Past Medical History:  Diagnosis Date   Carpal tunnel syndrome    bilateral, had shots in each one and "haven't had any problems since".   Diabetes mellitus    Hyperlipidemia    Hypertension    Prostate cancer (HCC)    Spinal stenosis    Past Surgical History:  Procedure Laterality Date   HERNIA REPAIR     HERNIA REPAIR     INGUINAL HERNIA REPAIR     KNEE SURGERY  right   KNEE SURGERY Left    PROSTATE BIOPSY     RADIOACTIVE SEED IMPLANT N/A 09/07/2018   Procedure: RADIOACTIVE SEED IMPLANT/BRACHYTHERAPY IMPLANT;  Surgeon: Jerilee Field, MD;  Location: [facility];  Service: Urology;  Laterality: N/A;   SPACE OAR INSTILLATION N/A 09/07/2018   Procedure: SPACE OAR INSTILLATION;  Surgeon: Jerilee Field, MD;  Location: [facility];  Service: Urology;  Laterality: N/A;   Patient Active Problem List   Diagnosis Date Noted   AKI (acute kidney injury) (HCC) 09/10/2023   Orthostatic hypotension 11/20/2021   not drinking enough fluids 11/20/2021   Imbalance 11/20/2021   Memory loss 11/20/2021    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   Muscle stiffness due to parkinson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;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   Parkinson's disease (HCC) 12/02/2020   Malignant neoplasm of prostate (HCC) 05/05/2018   Hypertension 06/10/2011   Hyperlipidemia 06/10/2011   Diabetes mellitus (HCC) 06/10/2011   Onychomycosis 06/10/2011   Paronychia 06/10/2011    ONSET DATE: 12/11/2023  REFERRING DIAG: R26.9 (ICD-10-CM) - Abnormality of gait and mobility   THERAPY DIAG:  Unsteadiness on feet  Other abnormalities of gait and mobility  Muscle weakness (generalized)  Other symptoms and signs involving the nervous system  Rationale for Evaluation and Treatment: Rehabilitation  SUBJECTIVE:  SUBJECTIVE STATEMENT: Still have some trouble getting going when I first stand up.  Pt accompanied by: significant other, wife in lobby  PERTINENT HISTORY: BLE pain, severe spinal stenosis L4-5, L3-4, Parkinson's, R hip pain, BLE lymphedema  PAIN:  Are you having pain? No  PRECAUTIONS: Fall  RED FLAGS: None   WEIGHT BEARING RESTRICTIONS: No  FALLS: Has patient fallen in last 6 months? Yes. Number of falls 3  LIVING ENVIRONMENT: Lives with: lives with their spouse Lives in: House/apartment Stairs: Yes: External: 2-3 steps; can reach both Has following equipment at home: Single point cane, Walker - 2 wheeled, and Wheelchair (manual)  PLOF: Independent  PATIENT GOALS: Pt wants to get back to normal.  OBJECTIVE:    TODAY'S TREATMENT: 01/18/2024 Activity Comments  Seated PWR! Up x 10 Seated PWR! Rock x 10   Sit to stand with PWR! Up x 10 Standing PWR! Rock x 10 Standing PWR! Step 2 x 5   UE support, cues for weightshift and big effort to return to midline  TUG  shuttle activity x 5 reps sit<>stand>short distance gait>turn/sit at chair; 6 reps  supervision  Sit to stand with forward step/weightshift then return to start position and sit Repeated 5 reps each leg  Forward/back walking along counter, 5 reps Cues for posture, hinge at hips to avoid posterior LOB  Forward/back step and weightshift x 10  UE support and cues for increased posterior step length  Forward step tap to 8" step, then back step x 10 reps UE support        HOME EXERCISE PROGRAM:  Access Code: [email or web address] URL: [email or web address] Date: 12/28/2023; 3/10/20205 Prepared by: [facility] - Outpatient  Rehab - [facility]  *VERBALLY ADDED:  Walking 2 minutes in hallway or laps in home, 3x/day with focus on POSTURE, step length/foot clearance-01/12/2024 *Verbally added PWR! Up, PWR! [place], [place]! Step in standing, 10 reps, 1-2x/day  Exercises - Sit to Stand  - 1 x daily - 7 x weekly - 3 sets - 5 reps - Seated March  - 1 x daily - 7 x weekly - 3 sets - 10 reps - Seated Long Arc Quad  - 1 x daily - 7 x weekly - 3 sets - 10 reps - 3 sec hold - Seated Ankle Dorsiflexion AROM  - 1 x daily - 7 x weekly - 3 sets - 10 reps - 3 sec hold - Standing Gastroc Stretch at Counter  - 1 x daily - 7 x weekly - 1 sets - 3 reps - 30 sec hold - Side Stepping with Counter Support  - 1 x daily - 5 x weekly - 2 sets - 10 reps - Alternating Step Backward with Support  - 1 x daily - 5 x weekly - 2 sets - 10 reps - Alternating Step Taps with Counter Support  - 1 x daily - 5 x weekly - 2 sets - 10 reps    PATIENT EDUCATION: Education details: Updates to HEP to include PWR! Moves; discussed how these exercises directly impact function, with improved sit to stand and initiation of gait Person educated: Patient Education method: Explanation and Demonstration Education comprehension: verbalized understanding, returned demonstration, and needs further education    --------------------------------------------------------------------------------------------------------------- Note: Objective measures were completed at Evaluation unless otherwise noted.  DIAGNOSTIC FINDINGS: NA  COGNITION: Overall cognitive status: Within functional limits for tasks assessed   POSTURE: rounded shoulders, forward head, and bilat UT tremors  LOWER EXTREMITY ROM:     Active  Right Eval Left Eval  Hip flexion    Hip extension    Hip abduction    Hip adduction    Hip internal rotation    Hip external rotation    Knee flexion    Knee extension -10 -12  Ankle dorsiflexion    Ankle plantarflexion    Ankle inversion    Ankle eversion     (Blank rows = not tested)  LOWER EXTREMITY MMT:    MMT Right Eval Left Eval  Hip flexion 4 4  Hip extension    Hip abduction 4 4  Hip adduction 4 4  Hip internal rotation    Hip external rotation    Knee flexion 4 4  Knee extension 4 4  Ankle dorsiflexion 3+ 3+  Ankle plantarflexion    Ankle inversion    Ankle eversion    (Blank rows = not tested)   TRANSFERS: Assistive device utilized: None  Sit to stand: SBA Stand to sit: SBA   GAIT: Gait pattern: step to pattern, step through pattern, decreased arm swing- Right, decreased arm swing- Left, decreased step length- Right, decreased step length- Left, shuffling, poor foot clearance- Right, and poor foot clearance- Left Distance walked: 50 ft x 2 Assistive device utilized: None Level of assistance: SBA Comments: increased BUE tremor, shuffling gait pattern, forward flexed posture  FUNCTIONAL TESTS:  5 times sit to stand: 20.22 sec Timed up and go (TUG): 29.72 sec 10 meter walk test: 19.97 sec = 1.64 ft/sec 360 turn:   R 12 steps   L 9 steps 78M backward walk:  19.93 sec TUG cognitive:30.89 sec TUG manual:  23.5 sec                                                                                                                               TREATMENT  DATE: 12/17/2023    PATIENT EDUCATION: Education details: Eval results, POC, HEP initiated Person educated: Patient Education method: Explanation, Demonstration, and Handouts Education comprehension: verbalized understanding  HOME EXERCISE PROGRAM: Access Code: [email or web address] URL: [email or web address] Date: 12/17/2023 Prepared by: [facility] - Outpatient  Rehab - [facility]  Exercises - Sit to Stand  - 1 x daily - 7 x weekly - 3 sets - 5 reps - Seated March  - 1 x daily - 7 x weekly - 3 sets - 10 reps - Seated Long Arc Quad  - 1 x daily - 7 x weekly - 3 sets - 10 reps - 3 sec hold - Seated Ankle Dorsiflexion AROM  - 1 x daily - 7 x weekly - 3 sets - 10 reps - 3 sec hold  GOALS: Goals reviewed with patient? Yes  SHORT TERM GOALS: Target date: 01/15/2024  Pt will be independent with HEP for improved strength, balance, gait. Baseline: Goal status: MET per report 01/14/2024  2.  Pt will improve 5x sit<>stand to less than or equal to 15 sec to demonstrate improved functional  strength and transfer efficiency. Baseline: 20.22 sec>16.22 sec 01/14/2024 Goal status:PARTIALLY MET 01/15/2024  3.  Pt will improve TUG score to less than or equal to 20 sec for decreased fall risk. Baseline: 29.72 sec>12.66 sec 01/14/2024 Goal status: MET, 01/14/2024  LONG TERM GOALS: Target date: 02/12/2024  Pt will be independent with progression of HEP for improved balance, gait, strength. Baseline:  Goal status: IN PROGRESS  2.  Pt will improve gait velocity to at least 2.6 ft/sec for improved gait efficiency and safety. Baseline: 1.64 ft/sec Goal status: IN PROGRESS  3.  Pt will improve TUG score to less than or equal to 15 sec for decreased fall risk. Baseline: 29.72 sec Goal status: IN PROGRESS  4.  Pt will perform 84M Walk test Backwards in < or equal to 10 seconds for improved balance in posterior direction. Baseline: 19 sec Goal status: IN PROGRESS  5.  Pt will verbalize plans for  continued community fitness upon d/c from PT to maximize gains made in PT. Baseline:  Goal status: IN PROGRESS  ASSESSMENT:  CLINICAL IMPRESSION: Pt presents today with reports of difficulty initiating gait upon standing. Skilled PT session focused on seated>standing PWR! Moves to address upright posture upon standing, weightshifting prior to gait and Big step and weightshift to initiate gait. Pt needs tactile and verbal cues for technique and for UE support as needed.  Educated patient in rationale for PWR! Moves directly to functional mobility and provided these exercises for home.  Pt will continue to benefit from skilled PT towards goals for improved functional mobility and decreased fall risk.   OBJECTIVE IMPAIRMENTS: Abnormal gait, decreased balance, decreased mobility, difficulty walking, decreased strength, impaired flexibility, and postural dysfunction.   ACTIVITY LIMITATIONS: standing, transfers, and locomotion level  PARTICIPATION LIMITATIONS: shopping, community activity, and yard work  PERSONAL FACTORS: 3+ comorbidities: see PMH  are also affecting patient's functional outcome.   REHAB POTENTIAL: Good  CLINICAL DECISION MAKING: Evolving/moderate complexity  EVALUATION COMPLEXITY: Moderate  PLAN:  PT FREQUENCY: 2x/week  PT DURATION: 8 weeks plus eval visit   PLANNED INTERVENTIONS: 97110-Therapeutic exercises, 97530- Therapeutic activity, [id number]- Neuromuscular re-education, 97535- Self Care, 98119- Manual therapy, [phone]- Gait training, Patient/Family education, Balance training, and DME instructions  PLAN FOR NEXT SESSION: 10th Visit PN; Progress exercises for home to include balance; review PWR! Moves added to HEP this visit; activities to increase step length/foot clearance; compliant surfaces.   Pt reports he is having some vertigo and dizziness upon getting up from the bed-may need to assess (he has not reported in several visits, so have not followed up on  this)   Gean Maidens., PT 01/18/2024, 11:03 AM  [facility] at [facility] [address], [address] [place], [place] [address] Phone # [phone] Fax # [phone]

## 2024-01-19 DIAGNOSIS — J439 Emphysema, unspecified: Secondary | ICD-10-CM | POA: Diagnosis not present

## 2024-01-19 DIAGNOSIS — E1169 Type 2 diabetes mellitus with other specified complication: Secondary | ICD-10-CM | POA: Diagnosis not present

## 2024-01-19 DIAGNOSIS — G20A1 Parkinson's disease without dyskinesia, without mention of fluctuations: Secondary | ICD-10-CM | POA: Diagnosis not present

## 2024-01-19 DIAGNOSIS — M4800 Spinal stenosis, site unspecified: Secondary | ICD-10-CM | POA: Diagnosis not present

## 2024-01-19 DIAGNOSIS — J8410 Pulmonary fibrosis, unspecified: Secondary | ICD-10-CM | POA: Diagnosis not present

## 2024-01-19 DIAGNOSIS — N1832 Chronic kidney disease, stage 3b: Secondary | ICD-10-CM | POA: Diagnosis not present

## 2024-01-19 DIAGNOSIS — J849 Interstitial pulmonary disease, unspecified: Secondary | ICD-10-CM | POA: Diagnosis not present

## 2024-01-19 DIAGNOSIS — E785 Hyperlipidemia, unspecified: Secondary | ICD-10-CM | POA: Diagnosis not present

## 2024-01-19 DIAGNOSIS — R053 Chronic cough: Secondary | ICD-10-CM | POA: Diagnosis not present

## 2024-01-20 ENCOUNTER — Ambulatory Visit: Payer: Medicare PPO | Admitting: Neurology

## 2024-01-20 ENCOUNTER — Encounter: Payer: Self-pay | Admitting: Neurology

## 2024-01-20 VITALS — BP 155/77 | HR 68 | Ht 67.0 in | Wt 167.8 lb

## 2024-01-20 DIAGNOSIS — K117 Disturbances of salivary secretion: Secondary | ICD-10-CM

## 2024-01-20 MED ORDER — ONABOTULINUMTOXINA 100 UNITS IJ SOLR
80.0000 [IU] | Freq: Once | INTRAMUSCULAR | Status: AC
  Administered 2024-01-20: 80 [IU] via INTRAMUSCULAR

## 2024-01-20 NOTE — Progress Notes (Signed)
 Subjective:    Patient ID: Scott Wang is a 83 y.o. male.  HPI  Scott Wang is an 83 year old right-handed gentleman with an underlying medical history of hypertension, hyperlipidemia, prostate cancer, spinal stenosis, diabetes, and Parkinson's disease, associated with memory loss, orthostatic hypotension, and sialorrhea who presents for his sixth botulinum toxin injection for sialorrhea in the context of Parkinson's disease.  The patient is accompanied by his wife today and presents after a gap of nearly 1 year.    He had his fifth botulinum toxin injection on 02/03/2023, at which time he felt that the increased dose of 100 units total was more effective compared to before.      Today, 01/20/2023: He reports that Botox injections were somewhat helpful for his drooling and he would like to try again.  He is agreeable to starting with 80 units total dose for today and considering an crease to 100 units next time if he tolerates it today.  He had no side effects in the past.     The original written informed consent for recurrent, 3 monthly intramuscular injections with botulinum toxin for this indication was obtained on 02/03/2022.  He was re-consented today and had no questions.  Today's consent was signed.    I talked to the patient in detail about expectations, limitations, benefits as well as potential adverse effects of botulinum toxin injections. The patient understands that the side effects include (but are not limited to): Mouth dryness, dryness of eyes, speech and swallowing difficulties, respiratory depression or problems breathing, weakness of muscles including more distant muscles than the ones injected, flu-like symptoms, myalgias, injection site reactions such as redness, itching, swelling, pain, and infection.  Distant effect of toxin was explained.   100 units of botulinum toxin type A in the form of Botox were reconstituted using preservative-free normal saline to a concentration of 10  units per 0.1 mL and drawn up into 1 mL tuberculin syringes. Lot number and expiration dates as below.     O/E: BP (!) 155/77 (Cuff Size: Normal)   Pulse 68   Ht 5\' 7"  (1.702 m)   Wt 167 lb 12.8 oz (76.1 kg)   BMI 26.28 kg/m     The patient was situated in a chair, sitting comfortably. After preparing the areas with 70% isopropyl alcohol and using a 30 gauge 1/2 inch needle for the facial injections, a total dose of 80 units of botulinum toxin type A in the form of Botox was injected into the muscles and the following distribution and quantities:   #1:  40 units in the right parotid gland, 40 units in the left parotid gland.     EMG guidance was not utilized for these injections.  A total dose of 20 units out of 100 units were discarded as unavoidable waste.     The patient tolerated the procedure well without immediate complications. He was advised to make a followup appointment for repeat injections in 3 months from now and encouraged to call us with any interim questions, concerns, problems, or updates. He was in agreement and did not have any questions prior to leaving clinic today.   Botox- 100 units x 1 vials Lot: [id number] Expiration: 03/2026 NDC: 5366-4403-47   Bacteriostatic 0.9% Sodium Chloride- 1.2 mL total Lot: [id number] Expiration: 09/10/24 NDC: 38756-433-29   Dx: K11.7. B/B Witnessed by Leeann Must RN   Previously (copied from previous notes for reference):    He had his fourth injection on 11/11/2022, at which  time he reported no significant improvement from the previous injection and we increased his total dose to 100 units, 50 units in each parotid gland.     He had his 3rd injection on 08/11/22, at which time he received 80 units total dose. He felt that the injections thus far had not helped much.      He called in the interim in late September 2023 indicating that the Botox had not helped.    He had his 2nd Botox injection on 05/22/2022, at which time we increased  his dose to a total of 70 units with 35 units injected in each parotid gland.    He had his first injection on 02/03/2022, at which time he received 50 units of Botox total dose divided in both parotid glands.

## 2024-01-21 ENCOUNTER — Encounter: Payer: Self-pay | Admitting: Physical Therapy

## 2024-01-21 ENCOUNTER — Ambulatory Visit: Payer: Medicare PPO | Admitting: Physical Therapy

## 2024-01-21 DIAGNOSIS — R2689 Other abnormalities of gait and mobility: Secondary | ICD-10-CM | POA: Diagnosis not present

## 2024-01-21 DIAGNOSIS — M6281 Muscle weakness (generalized): Secondary | ICD-10-CM | POA: Diagnosis not present

## 2024-01-21 DIAGNOSIS — R2681 Unsteadiness on feet: Secondary | ICD-10-CM | POA: Diagnosis not present

## 2024-01-21 DIAGNOSIS — R42 Dizziness and giddiness: Secondary | ICD-10-CM | POA: Diagnosis not present

## 2024-01-21 DIAGNOSIS — R29818 Other symptoms and signs involving the nervous system: Secondary | ICD-10-CM | POA: Diagnosis not present

## 2024-01-21 NOTE — Therapy (Signed)
 OUTPATIENT PHYSICAL THERAPY NEURO TREATMENT/10th Visit PROGRESS NOTE   Patient Name: Scott Wang MRN: [id number] DOB:December 21, 1940, 83 y.o., male Today's Date: 01/21/2024   PCP: [name], DO  REFERRING PROVIDER: Sheran Luz, MD   Progress Note Reporting Period 12/17/2023 to 01/21/2024  See note below for Objective Data and Assessment of Progress/Goals.     END OF SESSION:  PT End of Session - 01/21/24 1020     Visit Number 10    Number of Visits 17    Date for PT Re-Evaluation 02/12/24    Authorization Type [employer]-auth requested at eval    Authorization Time Period approved 17 PT visits from 12/17/2023-02/12/2024    Authorization - Visit Number 10    Authorization - Number of Visits 17    Progress Note Due on Visit 10    PT Start Time 1019    PT Stop Time 1100    PT Time Calculation (min) 41 min    Equipment Utilized During Treatment Gait belt    Activity Tolerance Patient tolerated treatment well    Behavior During Therapy WFL for tasks assessed/performed                   Past Medical History:  Diagnosis Date   Carpal tunnel syndrome    bilateral, had shots in each one and "haven't had any problems since".   Diabetes mellitus    Hyperlipidemia    Hypertension    Prostate cancer (HCC)    Spinal stenosis    Past Surgical History:  Procedure Laterality Date   HERNIA REPAIR     HERNIA REPAIR     INGUINAL HERNIA REPAIR     KNEE SURGERY  right   KNEE SURGERY Left    PROSTATE BIOPSY     RADIOACTIVE SEED IMPLANT N/A 09/07/2018   Procedure: RADIOACTIVE SEED IMPLANT/BRACHYTHERAPY IMPLANT;  Surgeon: Jerilee Field, MD;  Location: [facility];  Service: Urology;  Laterality: N/A;   SPACE OAR INSTILLATION N/A 09/07/2018   Procedure: SPACE OAR INSTILLATION;  Surgeon: Jerilee Field, MD;  Location: [facility];  Service: Urology;  Laterality: N/A;   Patient Active Problem List   Diagnosis Date Noted   AKI (acute  kidney injury) (HCC) 09/10/2023   Orthostatic hypotension 11/20/2021   not drinking enough fluids 11/20/2021   Imbalance 11/20/2021   Memory loss 11/20/2021   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   Muscle stiffness due to parkinson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;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   Parkinson's disease (HCC) 12/02/2020   Malignant neoplasm of prostate (HCC) 05/05/2018   Hypertension 06/10/2011   Hyperlipidemia 06/10/2011   Diabetes mellitus (HCC) 06/10/2011   Onychomycosis 06/10/2011   Paronychia 06/10/2011    ONSET DATE: 12/11/2023  REFERRING DIAG: R26.9 (ICD-10-CM) - Abnormality of gait and mobility   THERAPY DIAG:  Unsteadiness on feet  Other abnormalities of gait and mobility  Muscle weakness (generalized)  Other symptoms and signs involving the nervous system  Rationale for Evaluation and Treatment: Rehabilitation  SUBJECTIVE:  SUBJECTIVE STATEMENT: No changes, want to do some yardwork over the weekend.  Pt accompanied by: significant other, wife in lobby  PERTINENT HISTORY: BLE pain, severe spinal stenosis L4-5, L3-4, Parkinson's, R hip pain, BLE lymphedema  PAIN:  Are you having pain? No  PRECAUTIONS: Fall  RED FLAGS: None   WEIGHT BEARING RESTRICTIONS: No  FALLS: Has patient fallen in last 6 months? Yes. Number of falls 3  LIVING ENVIRONMENT: Lives with: lives with their spouse Lives in: House/apartment Stairs: Yes: External: 2-3 steps; can reach both Has following equipment at home: Single point cane, Walker - 2 wheeled, and Wheelchair (manual)  PLOF: Independent  PATIENT GOALS: Pt wants to get back to normal.  OBJECTIVE:    TODAY'S TREATMENT: 01/21/2024 Activity Comments  NuStep, Level 4, 4 extremities x 8 minutes Cues for  >80-90 SPM for aerobic warm up  10 M walk: 12.31 sec (2.66 ft/sec) 13.59 sec (2.42 ft/sec) 14.09 sec (2.32 ft/sec) Improved from 1.64 ft/sec  3 M walk backwards:  7.31 sec, 7.9 sec, 10.22 sec Improved from 19.97 sec  Review of PWR! Moves: PWR! Up x 10 PWR! Rock x 10 PWR! Step x 10 Good review and needs min cues for larger amplitude, hold at end rance  Sit>stand, rock>step and go, TUG distance, x 5 reps Good initiation of gait  Forward/back walking 2 minutes at counter, additional reps with cues for slowed pace and deliberate lifting in posterior direction Shuffling steps backwards  Forward/back stepping 10 reps cues for foot clearance and step length, posterior direction     HOME EXERCISE PROGRAM:  Access Code: [email or web address] URL: [email or web address] Date: 12/28/2023; 3/10/20205 Prepared by: [facility] - Outpatient  Rehab - [facility]  *VERBALLY ADDED:  Walking 2 minutes in hallway or laps in home, 3x/day with focus on POSTURE, step length/foot clearance-01/12/2024 *Verbally added PWR! Up, PWR! [place], [place]! Step in standing, 10 reps, 1-2x/day  Exercises - Sit to Stand  - 1 x daily - 7 x weekly - 3 sets - 5 reps - Seated March  - 1 x daily - 7 x weekly - 3 sets - 10 reps - Seated Long Arc Quad  - 1 x daily - 7 x weekly - 3 sets - 10 reps - 3 sec hold - Seated Ankle Dorsiflexion AROM  - 1 x daily - 7 x weekly - 3 sets - 10 reps - 3 sec hold - Standing Gastroc Stretch at Counter  - 1 x daily - 7 x weekly - 1 sets - 3 reps - 30 sec hold - Side Stepping with Counter Support  - 1 x daily - 5 x weekly - 2 sets - 10 reps - Alternating Step Backward with Support  - 1 x daily - 5 x weekly - 2 sets - 10 reps - Alternating Step Taps with Counter Support  - 1 x daily - 5 x weekly - 2 sets - 10 reps    PATIENT EDUCATION: Education details: Progress towards goals, began to discuss options for community fitness-[employer] and [employer] for aerobic activity and PD classes Person educated:  Patient Education method: Medical illustrator Education comprehension: verbalized understanding, returned demonstration, and needs further education   --------------------------------------------------------------------------------------------------------------- Note: Objective measures were completed at Evaluation unless otherwise noted.  DIAGNOSTIC FINDINGS: NA  COGNITION: Overall cognitive status: Within functional limits for tasks assessed   POSTURE: rounded shoulders, forward head, and bilat UT tremors  LOWER EXTREMITY ROM:     Active  Right Eval Left Eval  Hip flexion    Hip extension    Hip abduction    Hip adduction    Hip internal rotation    Hip external rotation    Knee flexion    Knee extension -10 -12  Ankle dorsiflexion    Ankle plantarflexion    Ankle inversion    Ankle eversion     (Blank rows = not tested)  LOWER EXTREMITY MMT:    MMT Right Eval Left Eval  Hip flexion 4 4  Hip extension    Hip abduction 4 4  Hip adduction 4 4  Hip internal rotation    Hip external rotation    Knee flexion 4 4  Knee extension 4 4  Ankle dorsiflexion 3+ 3+  Ankle plantarflexion    Ankle inversion    Ankle eversion    (Blank rows = not tested)   TRANSFERS: Assistive device utilized: None  Sit to stand: SBA Stand to sit: SBA   GAIT: Gait pattern: step to pattern, step through pattern, decreased arm swing- Right, decreased arm swing- Left, decreased step length- Right, decreased step length- Left, shuffling, poor foot clearance- Right, and poor foot clearance- Left Distance walked: 50 ft x 2 Assistive device utilized: None Level of assistance: SBA Comments: increased BUE tremor, shuffling gait pattern, forward flexed posture  FUNCTIONAL TESTS:  5 times sit to stand: 20.22 sec Timed up and go (TUG): 29.72 sec 10 meter walk test: 19.97 sec = 1.64 ft/sec 360 turn:   R 12 steps   L 9 steps 60M backward walk:  19.93 sec TUG cognitive:30.89  sec TUG manual:  23.5 sec                                                                                                                               TREATMENT DATE: 12/17/2023    PATIENT EDUCATION: Education details: Eval results, POC, HEP initiated Person educated: Patient Education method: Explanation, Demonstration, and Handouts Education comprehension: verbalized understanding  HOME EXERCISE PROGRAM: Access Code: [email or web address] URL: [email or web address] Date: 12/17/2023 Prepared by: [facility] - Outpatient  Rehab - [facility]  Exercises - Sit to Stand  - 1 x daily - 7 x weekly - 3 sets - 5 reps - Seated March  - 1 x daily - 7 x weekly - 3 sets - 10 reps - Seated Long Arc Quad  - 1 x daily - 7 x weekly - 3 sets - 10 reps - 3 sec hold - Seated Ankle Dorsiflexion AROM  - 1 x daily - 7 x weekly - 3 sets - 10 reps - 3 sec hold  GOALS: Goals reviewed with patient? Yes  SHORT TERM GOALS: Target date: 01/15/2024  Pt will be independent with HEP for improved strength, balance, gait. Baseline: Goal status: MET per report 01/14/2024  2.  Pt will improve 5x sit<>stand to less than or equal to 15 sec to demonstrate improved functional strength and transfer efficiency. Baseline:  20.22 sec>16.22 sec 01/14/2024 Goal status:PARTIALLY MET 01/15/2024  3.  Pt will improve TUG score to less than or equal to 20 sec for decreased fall risk. Baseline: 29.72 sec>12.66 sec 01/14/2024 Goal status: MET, 01/14/2024  LONG TERM GOALS: Target date: 02/12/2024  Pt will be independent with progression of HEP for improved balance, gait, strength. Baseline:  Goal status: IN PROGRESS  2.  Pt will improve gait velocity to at least 2.6 ft/sec for improved gait efficiency and safety. Baseline: 1.64 ft/sec Goal status: IN PROGRESS  3.  Pt will improve TUG score to less than or equal to 15 sec for decreased fall risk. Baseline: 29.72 sec Goal status: IN PROGRESS  4.  Pt will perform 18M Walk test  Backwards in < or equal to 10 seconds for improved balance in posterior direction. Baseline: 19 sec Goal status: IN PROGRESS  5.  Pt will verbalize plans for continued community fitness upon d/c from PT to maximize gains made in PT. Baseline:  Goal status: IN PROGRESS  ASSESSMENT:  CLINICAL IMPRESSION: 10th Visit PN:  Pt reports some improvement since beginning OPPT, but he does note that he can work hard and complete tasks (such as sit to stand) for 5-10 reps, and then he gives out with further attempts.  Objective measures demo good improvement:  60M walk:  2.3-2.6 ft/sec (improved from 1.64 ft/sec at eval) and 3 M walk back 7-10 seconds (improved from 19.97 sec at eval).  STGs assessed last week, with pt meeting 2 of 3 goals and improving FTSTS and TUG scores.  He does remain at fall risk per objective measures and he would benefit from continued skilled PT towards LTGs for overall improved functional mobility and decreased fall risk.  OBJECTIVE IMPAIRMENTS: Abnormal gait, decreased balance, decreased mobility, difficulty walking, decreased strength, impaired flexibility, and postural dysfunction.   ACTIVITY LIMITATIONS: standing, transfers, and locomotion level  PARTICIPATION LIMITATIONS: shopping, community activity, and yard work  PERSONAL FACTORS: 3+ comorbidities: see PMH  are also affecting patient's functional outcome.   REHAB POTENTIAL: Good  CLINICAL DECISION MAKING: Evolving/moderate complexity  EVALUATION COMPLEXITY: Moderate  PLAN:  PT FREQUENCY: 2x/week  PT DURATION: 8 weeks plus eval visit   PLANNED INTERVENTIONS: 97110-Therapeutic exercises, 97530- Therapeutic activity, 97112- Neuromuscular re-education, 97535- Self Care, 40981- Manual therapy, [phone]- Gait training, Patient/Family education, Balance training, and DME instructions  PLAN FOR NEXT SESSION: Assess vertigo/dizziness (pt reports this happens when getting up from bed).  Progress exercises for home to  include balance; review PWR! Moves added to HEP this visit; activities to increase step length/foot clearance; compliant surfaces.     Gean Maidens., PT 01/21/2024, 12:06 PM  [facility] [address] [place], [address] [place], [place] [address] Phone # [phone] Fax # [phone]

## 2024-01-22 ENCOUNTER — Other Ambulatory Visit: Payer: Self-pay | Admitting: Neurology

## 2024-01-24 ENCOUNTER — Emergency Department (HOSPITAL_BASED_OUTPATIENT_CLINIC_OR_DEPARTMENT_OTHER)
Admission: EM | Admit: 2024-01-24 | Discharge: 2024-01-25 | Discharge status: Against Medical Advice | Attending: Emergency Medicine | Admitting: Emergency Medicine

## 2024-01-24 ENCOUNTER — Other Ambulatory Visit: Payer: Self-pay

## 2024-01-24 ENCOUNTER — Emergency Department (HOSPITAL_BASED_OUTPATIENT_CLINIC_OR_DEPARTMENT_OTHER)

## 2024-01-24 ENCOUNTER — Encounter (HOSPITAL_BASED_OUTPATIENT_CLINIC_OR_DEPARTMENT_OTHER): Payer: Self-pay

## 2024-01-24 DIAGNOSIS — K5730 Diverticulosis of large intestine without perforation or abscess without bleeding: Secondary | ICD-10-CM | POA: Diagnosis not present

## 2024-01-24 DIAGNOSIS — D649 Anemia, unspecified: Secondary | ICD-10-CM | POA: Diagnosis not present

## 2024-01-24 DIAGNOSIS — E119 Type 2 diabetes mellitus without complications: Secondary | ICD-10-CM | POA: Diagnosis not present

## 2024-01-24 DIAGNOSIS — N281 Cyst of kidney, acquired: Secondary | ICD-10-CM | POA: Diagnosis not present

## 2024-01-24 DIAGNOSIS — G20C Parkinsonism, unspecified: Secondary | ICD-10-CM | POA: Diagnosis not present

## 2024-01-24 DIAGNOSIS — R1011 Right upper quadrant pain: Secondary | ICD-10-CM | POA: Diagnosis not present

## 2024-01-24 DIAGNOSIS — N2889 Other specified disorders of kidney and ureter: Secondary | ICD-10-CM | POA: Diagnosis not present

## 2024-01-24 DIAGNOSIS — Z79899 Other long term (current) drug therapy: Secondary | ICD-10-CM | POA: Insufficient documentation

## 2024-01-24 DIAGNOSIS — Z8546 Personal history of malignant neoplasm of prostate: Secondary | ICD-10-CM | POA: Insufficient documentation

## 2024-01-24 DIAGNOSIS — R1031 Right lower quadrant pain: Secondary | ICD-10-CM | POA: Insufficient documentation

## 2024-01-24 DIAGNOSIS — R109 Unspecified abdominal pain: Secondary | ICD-10-CM | POA: Diagnosis present

## 2024-01-24 DIAGNOSIS — E871 Hypo-osmolality and hyponatremia: Secondary | ICD-10-CM | POA: Diagnosis not present

## 2024-01-24 DIAGNOSIS — R4182 Altered mental status, unspecified: Secondary | ICD-10-CM | POA: Diagnosis not present

## 2024-01-24 DIAGNOSIS — I10 Essential (primary) hypertension: Secondary | ICD-10-CM | POA: Diagnosis not present

## 2024-01-24 LAB — URINALYSIS, ROUTINE W REFLEX MICROSCOPIC
Bacteria, UA: NONE SEEN
Bilirubin Urine: NEGATIVE
Glucose, UA: NEGATIVE mg/dL
Hgb urine dipstick: NEGATIVE
Ketones, ur: NEGATIVE mg/dL
Leukocytes,Ua: NEGATIVE
Nitrite: NEGATIVE
Protein, ur: 30 mg/dL — AB
Specific Gravity, Urine: 1.02 (ref 1.005–1.030)
pH: 5.5 (ref 5.0–8.0)

## 2024-01-24 LAB — BASIC METABOLIC PANEL
Anion gap: 10 (ref 5–15)
BUN: 25 mg/dL — ABNORMAL HIGH (ref 8–23)
CO2: 21 mmol/L — ABNORMAL LOW (ref 22–32)
Calcium: 10 mg/dL (ref 8.9–10.3)
Chloride: 103 mmol/L (ref 98–111)
Creatinine, Ser: 1.69 mg/dL — ABNORMAL HIGH (ref 0.61–1.24)
GFR, Estimated: 40 mL/min — ABNORMAL LOW (ref >60)
Glucose, Bld: 145 mg/dL — ABNORMAL HIGH (ref 70–99)
Potassium: 3.6 mmol/L (ref 3.5–5.1)
Sodium: 134 mmol/L — ABNORMAL LOW (ref 135–145)

## 2024-01-24 LAB — CBC
HCT: 37.5 % — ABNORMAL LOW (ref 39.0–52.0)
Hemoglobin: 12.7 g/dL — ABNORMAL LOW (ref 13.0–17.0)
MCH: 29.3 pg (ref 26.0–34.0)
MCHC: 33.9 g/dL (ref 30.0–36.0)
MCV: 86.6 fL (ref 80.0–100.0)
Platelets: 194 10*3/uL (ref 150–400)
RBC: 4.33 MIL/uL (ref 4.22–5.81)
RDW: 13.5 % (ref 11.5–15.5)
WBC: 9.2 10*3/uL (ref 4.0–10.5)
nRBC: 0 % (ref 0.0–0.2)

## 2024-01-24 MED ORDER — IOHEXOL 300 MG/ML  SOLN
100.0000 mL | Freq: Once | INTRAMUSCULAR | Status: AC | PRN
  Administered 2024-01-24: 100 mL via INTRAVENOUS

## 2024-01-24 NOTE — ED Provider Notes (Signed)
 [place] EMERGENCY DEPARTMENT AT [facility] Provider Note   CSN: [id number] Arrival date & time: 01/24/24  1822     History  No chief complaint on file.   Scott Wang is a 83 y.o. male past medical history significant for prostate cancer, hypertension, diabetes, and Parkinson's disease presents today for suspicion of UTI.  Patient's wife reports that the patient had urosepsis in October.  Patient's wife states that the patient has had issues with ambulation and has seemed "out of it" since yesterday.  Patient also endorses right sided abdominal pain.  Patient denies nausea, vomiting, shortness of breath, chest pain, dysuria, hematuria, or confusion.  HPI     Home Medications Prior to Admission medications   Medication Sig Start Date End Date Taking? Authorizing Provider  alfuzosin (UROXATRAL) 10 MG 24 hr tablet Take 10 mg by mouth daily with breakfast.    [provider]  atorvastatin (LIPITOR) 10 MG tablet Take 10 mg by mouth at bedtime. 01/03/17   [provider]  carbidopa-levodopa (SINEMET IR) 25-100 MG tablet TAKE 1 TABLET BY MOUTH THREE TIMES A DAY 12/01/23   Butch Penny, NP  carvedilol (COREG) 3.125 MG tablet Take 3.125 mg by mouth 2 (two) times daily with a meal. 01/21/17   [provider]  Cyanocobalamin (VITAMIN B-12 PO) Take 2,500 mcg by mouth daily.    [provider]  donepezil (ARICEPT) 5 MG tablet Take 1 tablet (5 mg total) by mouth at bedtime. 09/17/23   Rhetta Mura, MD  HYDROcodone-acetaminophen (NORCO/VICODIN) 5-325 MG tablet Take 1 tablet by mouth every 6 (six) hours as needed (for pain). 09/17/23   Rhetta Mura, MD  Multiple Vitamins-Minerals (ONE-A-DAY 50 PLUS PO) Take 1 tablet by mouth daily with breakfast.    [provider]  Dola Argyle LANCETS FINE MISC  01/01/17   [provider]  pantoprazole (PROTONIX) 40 MG tablet Take 1 tablet (40 mg total) by mouth daily. 09/18/23    Rhetta Mura, MD  TYLENOL 500 MG tablet Take 500 mg by mouth 2 (two) times daily as needed for mild pain (pain score 1-3) or headache.    [provider]      Allergies    Sulfa antibiotics    Review of Systems   Review of Systems  Psychiatric/Behavioral:  Positive for confusion.     Physical Exam Updated Vital Signs BP (!) 150/77 (BP Location: Left Arm)   Pulse 68   Temp 98.3 F (36.8 C) (Temporal)   Resp 16   Ht 5\' 7"  (1.702 m)   Wt 75.8 kg   SpO2 96%   BMI 26.16 kg/m  Physical Exam Vitals and nursing note reviewed.  Constitutional:      General: He is not in acute distress.    Appearance: Normal appearance. He is not toxic-appearing or diaphoretic.  HENT:     Head: Normocephalic and atraumatic.     Right Ear: External ear normal.     Left Ear: External ear normal.     Nose: Nose normal.     Mouth/Throat:     Mouth: Mucous membranes are moist.  Eyes:     Extraocular Movements: Extraocular movements intact.  Cardiovascular:     Rate and Rhythm: Normal rate and regular rhythm.     Pulses: Normal pulses.     Heart sounds: Normal heart sounds.  Pulmonary:     Effort: Pulmonary effort is normal.  Abdominal:     General: Bowel sounds are normal.  Palpations: Abdomen is soft.     Tenderness: There is abdominal tenderness in the right upper quadrant and right lower quadrant. There is guarding.  Musculoskeletal:     Cervical back: Normal range of motion.  Skin:    Capillary Refill: Capillary refill takes less than 2 seconds.  Neurological:     General: No focal deficit present.     Mental Status: He is oriented to person, place, and time.     ED Results / Procedures / Treatments   Labs (all labs ordered are listed, but only abnormal results are displayed) Labs Reviewed  BASIC METABOLIC PANEL - Abnormal; Notable for the following components:      Result Value   Sodium 134 (*)    CO2 21 (*)    Glucose, Bld 145 (*)    BUN 25 (*)     Creatinine, Ser 1.69 (*)    GFR, Estimated 40 (*)    All other components within normal limits  CBC - Abnormal; Notable for the following components:   Hemoglobin 12.7 (*)    HCT 37.5 (*)    All other components within normal limits  URINALYSIS, ROUTINE W REFLEX MICROSCOPIC - Abnormal; Notable for the following components:   Protein, ur 30 (*)    All other components within normal limits    EKG None  Radiology CT ABDOMEN PELVIS W CONTRAST Result Date: 01/24/2024 CLINICAL DATA:  Right lower quadrant abdominal pain, suspicion for UTI. Urosepsis in October. EXAM: CT ABDOMEN AND PELVIS WITH CONTRAST TECHNIQUE: Multidetector CT imaging of the abdomen and pelvis was performed using the standard protocol following bolus administration of intravenous contrast. RADIATION DOSE REDUCTION: This exam was performed according to the departmental dose-optimization program which includes automated exposure control, adjustment of the mA and/or kV according to patient size and/or use of iterative reconstruction technique. 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 No acute abnormality. Hepatobiliary: No acute abnormality. Pancreas: Unremarkable. Spleen: Unremarkable. Adrenals/Urinary Tract: Normal adrenal glands. Mass in the lower pole of the left kidney measures 3.2 cm and has increased in size compared to 07/02/2023 when measured 2.6 cm. Small cysts in the left kidney. No follow-up recommended. No urinary calculi or hydronephrosis. Unremarkable bladder. Stomach/Bowel: Normal caliber large and small bowel. No bowel wall thickening. Colonic diverticulosis without diverticulitis. Stomach and appendix are within normal limits. Vascular/Lymphatic: Aortic atherosclerosis. No enlarged abdominal or pelvic lymph nodes. 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 Other: No free intraperitoneal fluid  or air. Musculoskeletal: No acute fracture. IMPRESSION: 1. No acute abnormality in the abdomen or pelvis. 2. 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 3. Colonic diverticulosis without diverticulitis. 4. Aortic Atherosclerosis (ICD10-I70.0). Electronically Signed   By: Minerva Fester M.D.   On: 01/24/2024 23:45    Procedures Procedures    Medications Ordered in ED Medications  iohexol (OMNIPAQUE) 300 MG/ML solution 100 mL (100 mLs Intravenous Contrast Given 01/24/24 2330)    ED Course/ Medical Decision Making/ A&P                                 Medical Decision Making Amount and/or Complexity of Data Reviewed Labs: ordered.   This patient presents to the ED with chief complaint(s) of change in mental status with pertinent past medical history of urosepsis,  prostate cancer which further complicates the presenting complaint. The complaint involves an extensive differential diagnosis and also carries with it a high risk of complications and morbidity.    The differential diagnosis includes UTI, pyelonephritis, urosepsis,  Additional history obtained: Additional history obtained from family Records reviewed previous admission documents  ED Course and Reassessment:   Independent labs interpretation:  The following labs were independently interpreted:  CBC: Mild anemia at 12.7 which is chronic per historical values BMP: Mild hyponatremia, mildly decreased CO2, mildly elevated bun, creatinine which is chronic per historical values UA: 30 protein  Independent visualization of imaging: - I independently visualized the following imaging with scope of interpretation limited to determining acute life threatening conditions related to emergency care: CT abdomen pelvis with contrast, which revealed no acute abnormality in the abdomen or pelvis.  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  Patient eloped  prior to complete ED assessment and disposition.        Final Clinical Impression(s) / ED Diagnoses Final diagnoses:  Abdominal pain, unspecified abdominal location    Rx / DC Orders ED Discharge Orders     None         Dolphus Jenny, PA-C 01/25/24 0000    Arby Barrette, MD 01/28/24 [phone]

## 2024-01-24 NOTE — ED Triage Notes (Signed)
 Pt BIB wife, who reports suspicion of UTI. Reports hx of urosepsis in October; wife reports similar sx that began yesterday. Wife states pt has had issues w ambulation and has seemed 'out of it'.   Hx Parkinson's

## 2024-01-24 NOTE — ED Notes (Signed)
 In CT scan at present.

## 2024-01-24 NOTE — ED Notes (Signed)
 Returned from CT. Pt states he is ready to go home. Wife with pt.

## 2024-01-24 NOTE — ED Notes (Signed)
 [name] wife came to desk and states patient is ready to go home. Pt agreed to have his CT scan done, but states he is not going to wait for the results. Dr. Donnald Garre aware. Pt encouraged to stay, but he declined.

## 2024-01-25 NOTE — ED Notes (Signed)
 Assisted pt to the car by wheelchair. PA aware that patient was leaving and did not want to wait for results.

## 2024-01-26 ENCOUNTER — Ambulatory Visit: Payer: Medicare PPO | Admitting: Physical Therapy

## 2024-01-28 ENCOUNTER — Ambulatory Visit: Payer: Medicare PPO | Admitting: Physical Therapy

## 2024-01-28 ENCOUNTER — Encounter: Payer: Self-pay | Admitting: Physical Therapy

## 2024-01-28 DIAGNOSIS — R29818 Other symptoms and signs involving the nervous system: Secondary | ICD-10-CM | POA: Diagnosis not present

## 2024-01-28 DIAGNOSIS — R2689 Other abnormalities of gait and mobility: Secondary | ICD-10-CM

## 2024-01-28 DIAGNOSIS — R2681 Unsteadiness on feet: Secondary | ICD-10-CM

## 2024-01-28 DIAGNOSIS — R42 Dizziness and giddiness: Secondary | ICD-10-CM | POA: Diagnosis not present

## 2024-01-28 DIAGNOSIS — M6281 Muscle weakness (generalized): Secondary | ICD-10-CM | POA: Diagnosis not present

## 2024-01-28 NOTE — Therapy (Signed)
 OUTPATIENT PHYSICAL THERAPY NEURO TREATMENT   Patient Name: Scott Wang MRN: [id number] DOB:09-29-1941, 83 y.o., male Today's Date: 01/28/2024   PCP: [name], DO  REFERRING PROVIDER: Sheran Luz, MD      END OF SESSION:  PT End of Session - 01/28/24 1013     Visit Number 11    Number of Visits 17    Date for PT Re-Evaluation 02/12/24    Authorization Type [employer] Medicare-auth requested at eval    Authorization Time Period approved 17 PT visits from 12/17/2023-02/12/2024    Authorization - Visit Number 11    Authorization - Number of Visits 17    PT Start Time 1014    PT Stop Time 1057    PT Time Calculation (min) 43 min    Equipment Utilized During Treatment Gait belt    Activity Tolerance Patient limited by lethargy    Behavior During Therapy WFL for tasks assessed/performed;Flat affect                    Past Medical History:  Diagnosis Date   Carpal tunnel syndrome    bilateral, had shots in each one and "haven't had any problems since".   Diabetes mellitus    Hyperlipidemia    Hypertension    Parkinson's disease (HCC) 2021   Prostate cancer (HCC)    Spinal stenosis    Past Surgical History:  Procedure Laterality Date   HERNIA REPAIR     HERNIA REPAIR     INGUINAL HERNIA REPAIR     KNEE SURGERY  right   KNEE SURGERY Left    PROSTATE BIOPSY     RADIOACTIVE SEED IMPLANT N/A 09/07/2018   Procedure: RADIOACTIVE SEED IMPLANT/BRACHYTHERAPY IMPLANT;  Surgeon: Jerilee Field, MD;  Location: [facility];  Service: Urology;  Laterality: N/A;   SPACE OAR INSTILLATION N/A 09/07/2018   Procedure: SPACE OAR INSTILLATION;  Surgeon: Jerilee Field, MD;  Location: [facility];  Service: Urology;  Laterality: N/A;   Patient Active Problem List   Diagnosis Date Noted   AKI (acute kidney injury) (HCC) 09/10/2023   Orthostatic hypotension 11/20/2021   not drinking enough fluids 11/20/2021   Imbalance 11/20/2021    Memory loss 11/20/2021   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   Muscle stiffness due to parkinson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;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   Parkinson's disease (HCC) 12/02/2020   Malignant neoplasm of prostate (HCC) 05/05/2018   Hypertension 06/10/2011   Hyperlipidemia 06/10/2011   Diabetes mellitus (HCC) 06/10/2011   Onychomycosis 06/10/2011   Paronychia 06/10/2011    ONSET DATE: 12/11/2023  REFERRING DIAG: R26.9 (ICD-10-CM) - Abnormality of gait and mobility   THERAPY DIAG:  Unsteadiness on feet  Other abnormalities of gait and mobility  Muscle weakness (generalized)  Other symptoms and signs involving the nervous system  Rationale for Evaluation and Treatment: Rehabilitation  SUBJECTIVE:  SUBJECTIVE STATEMENT: Wasn't feeling good and went to ED over the weekend.  Thought it might be another infection, but no infection noted.  Tried to work outside, but was very off balance.  Pt accompanied by: significant other, wife in lobby  PERTINENT HISTORY: BLE pain, severe spinal stenosis L4-5, L3-4, Parkinson's, R hip pain, BLE lymphedema  PAIN:  Are you having pain? No  PRECAUTIONS: Fall  RED FLAGS: None   WEIGHT BEARING RESTRICTIONS: No  FALLS: Has patient fallen in last 6 months? Yes. Number of falls 3  LIVING ENVIRONMENT: Lives with: lives with their spouse Lives in: House/apartment Stairs: Yes: External: 2-3 steps; can reach both Has following equipment at home: Single point cane, Walker - 2 wheeled, and Wheelchair (manual)  PLOF: Independent  PATIENT GOALS: Pt wants to get back to normal.  OBJECTIVE:    TODAY'S TREATMENT: 01/28/2024 Activity Comments  NuStep, Level 4, 4 extremities x 8 minutes  For aerobic warm up, 30 sec intervals  >80; pt keeping self-selected pace >70 SPM  Standing on Airex: Heel/toe raises 2 x 10 Marching in place 2 x 10 Minisquats to upright standing, 2 x 10 Cues for larger amplitude motion, cues to lessen UE support  On Airex: Forward step tap to floor Side step tap to floor Back step tap to floor Forward/back stepping X 10, UE support, cues for increased foot clearance  Gait with 4-wheeled RW 50 ft x 2 indoors, 100 ft, 50 ft outdoors Min guard/min assist and frequent cues for slowed pace, to increase foot clearance, to stay close within walker's BOS                 HOME EXERCISE PROGRAM:  Access Code: [email or web address] URL: [email or web address] Date: 12/28/2023; 3/10/20205 Prepared by: [facility] - Outpatient  Rehab - [facility]  *VERBALLY ADDED:  Walking 2 minutes in hallway or laps in home, 3x/day with focus on POSTURE, step length/foot clearance-01/12/2024 *Verbally added PWR! Up, PWR! [place], [place]! Step in standing, 10 reps, 1-2x/day  Exercises - Sit to Stand  - 1 x daily - 7 x weekly - 3 sets - 5 reps - Seated March  - 1 x daily - 7 x weekly - 3 sets - 10 reps - Seated Long Arc Quad  - 1 x daily - 7 x weekly - 3 sets - 10 reps - 3 sec hold - Seated Ankle Dorsiflexion AROM  - 1 x daily - 7 x weekly - 3 sets - 10 reps - 3 sec hold - Standing Gastroc Stretch at Counter  - 1 x daily - 7 x weekly - 1 sets - 3 reps - 30 sec hold - Side Stepping with Counter Support  - 1 x daily - 5 x weekly - 2 sets - 10 reps - Alternating Step Backward with Support  - 1 x daily - 5 x weekly - 2 sets - 10 reps - Alternating Step Taps with Counter Support  - 1 x daily - 5 x weekly - 2 sets - 10 reps    PATIENT EDUCATION: Education details: Encouraged pt to discuss with MD his PD medications, given his response to activity, decreased response to physical activity and exercise, decreased initiation/motivation for exercise (outside of PT sessions, per report) Person educated: Patient Education  method: Explanation and Demonstration Education comprehension: verbalized understanding, returned demonstration, and needs further education   --------------------------------------------------------------------------------------------------------------- Note: Objective measures were completed at Evaluation unless otherwise noted.  DIAGNOSTIC FINDINGS: NA  COGNITION: Overall cognitive status: Within  functional limits for tasks assessed   POSTURE: rounded shoulders, forward head, and bilat UT tremors  LOWER EXTREMITY ROM:     Active  Right Eval Left Eval  Hip flexion    Hip extension    Hip abduction    Hip adduction    Hip internal rotation    Hip external rotation    Knee flexion    Knee extension -10 -12  Ankle dorsiflexion    Ankle plantarflexion    Ankle inversion    Ankle eversion     (Blank rows = not tested)  LOWER EXTREMITY MMT:    MMT Right Eval Left Eval  Hip flexion 4 4  Hip extension    Hip abduction 4 4  Hip adduction 4 4  Hip internal rotation    Hip external rotation    Knee flexion 4 4  Knee extension 4 4  Ankle dorsiflexion 3+ 3+  Ankle plantarflexion    Ankle inversion    Ankle eversion    (Blank rows = not tested)   TRANSFERS: Assistive device utilized: None  Sit to stand: SBA Stand to sit: SBA   GAIT: Gait pattern: step to pattern, step through pattern, decreased arm swing- Right, decreased arm swing- Left, decreased step length- Right, decreased step length- Left, shuffling, poor foot clearance- Right, and poor foot clearance- Left Distance walked: 50 ft x 2 Assistive device utilized: None Level of assistance: SBA Comments: increased BUE tremor, shuffling gait pattern, forward flexed posture  FUNCTIONAL TESTS:  5 times sit to stand: 20.22 sec Timed up and go (TUG): 29.72 sec 10 meter walk test: 19.97 sec = 1.64 ft/sec 360 turn:   R 12 steps   L 9 steps 58M backward walk:  19.93 sec TUG cognitive:30.89 sec TUG manual:  23.5  sec                                                                                                                               TREATMENT DATE: 12/17/2023    PATIENT EDUCATION: Education details: Eval results, POC, HEP initiated Person educated: Patient Education method: Explanation, Demonstration, and Handouts Education comprehension: verbalized understanding  HOME EXERCISE PROGRAM: Access Code: [email or web address] URL: [email or web address] Date: 12/17/2023 Prepared by: [facility] - Outpatient  Rehab - [facility]  Exercises - Sit to Stand  - 1 x daily - 7 x weekly - 3 sets - 5 reps - Seated March  - 1 x daily - 7 x weekly - 3 sets - 10 reps - Seated Long Arc Quad  - 1 x daily - 7 x weekly - 3 sets - 10 reps - 3 sec hold - Seated Ankle Dorsiflexion AROM  - 1 x daily - 7 x weekly - 3 sets - 10 reps - 3 sec hold  GOALS: Goals reviewed with patient? Yes  SHORT TERM GOALS: Target date: 01/15/2024  Pt will be independent with HEP for improved strength, balance, gait. Baseline:  Goal status: MET per report 01/14/2024  2.  Pt will improve 5x sit<>stand to less than or equal to 15 sec to demonstrate improved functional strength and transfer efficiency. Baseline: 20.22 sec>16.22 sec 01/14/2024 Goal status:PARTIALLY MET 01/15/2024  3.  Pt will improve TUG score to less than or equal to 20 sec for decreased fall risk. Baseline: 29.72 sec>12.66 sec 01/14/2024 Goal status: MET, 01/14/2024  LONG TERM GOALS: Target date: 02/12/2024  Pt will be independent with progression of HEP for improved balance, gait, strength. Baseline:  Goal status: IN PROGRESS  2.  Pt will improve gait velocity to at least 2.6 ft/sec for improved gait efficiency and safety. Baseline: 1.64 ft/sec Goal status: IN PROGRESS  3.  Pt will improve TUG score to less than or equal to 15 sec for decreased fall risk. Baseline: 29.72 sec Goal status: IN PROGRESS  4.  Pt will perform 82M Walk test Backwards in < or equal  to 10 seconds for improved balance in posterior direction. Baseline: 19 sec Goal status: IN PROGRESS  5.  Pt will verbalize plans for continued community fitness upon d/c from PT to maximize gains made in PT. Baseline:  Goal status: IN PROGRESS  ASSESSMENT:  CLINICAL IMPRESSION: Pt presents today reporting he had ED visit due to some symptoms similar to previous sepsis infection; he does report no infection noted at this time.  Pt does seem slower with mobility today and has lower voice volume, continues with bradykinesia, trailing off with movement patterns throughout sets of activity.  Skilled PT session focused on balance work and did encourage patient to consider use of walker or cane on outdoor surfaces (pt likes to be outside, but has reported fear of falling).  Trialed [phone] today and pt pushes it too far in front of him and needs frequent>constant cues/assist to keep walker close and take longer steps.  Pt would likely not be safe with this type of walker or would need extra practice.  Pt will continue to benefit from skilled PT towards goals for improved functional mobility and decreased fall risk.   OBJECTIVE IMPAIRMENTS: Abnormal gait, decreased balance, decreased mobility, difficulty walking, decreased strength, impaired flexibility, and postural dysfunction.   ACTIVITY LIMITATIONS: standing, transfers, and locomotion level  PARTICIPATION LIMITATIONS: shopping, community activity, and yard work  PERSONAL FACTORS: 3+ comorbidities: see PMH  are also affecting patient's functional outcome.   REHAB POTENTIAL: Good  CLINICAL DECISION MAKING: Evolving/moderate complexity  EVALUATION COMPLEXITY: Moderate  PLAN:  PT FREQUENCY: 2x/week  PT DURATION: 8 weeks plus eval visit   PLANNED INTERVENTIONS: 97110-Therapeutic exercises, 97530- Therapeutic activity, 97112- Neuromuscular re-education, 97535- Self Care, 62130- Manual therapy, [phone]- Gait training, Patient/Family education,  Balance training, and DME instructions  PLAN FOR NEXT SESSION: *Assess vertigo/dizziness (pt reports this happens when getting up from bed).  Trial gait with RW.  Progress exercises for home to include balance; review PWR! Moves added to HEP this visit; activities to increase step length/foot clearance; compliant surfaces.     Gean Maidens., PT 01/28/2024, 12:42 PM  [facility] at [facility] [address] [place], [address] [place], [place] [address] Phone # [phone] Fax # [phone]

## 2024-02-02 ENCOUNTER — Ambulatory Visit: Admitting: Physical Therapy

## 2024-02-02 ENCOUNTER — Encounter: Payer: Self-pay | Admitting: Physical Therapy

## 2024-02-02 DIAGNOSIS — M6281 Muscle weakness (generalized): Secondary | ICD-10-CM

## 2024-02-02 DIAGNOSIS — R29818 Other symptoms and signs involving the nervous system: Secondary | ICD-10-CM

## 2024-02-02 DIAGNOSIS — R2681 Unsteadiness on feet: Secondary | ICD-10-CM

## 2024-02-02 DIAGNOSIS — R2689 Other abnormalities of gait and mobility: Secondary | ICD-10-CM

## 2024-02-02 DIAGNOSIS — R42 Dizziness and giddiness: Secondary | ICD-10-CM | POA: Diagnosis not present

## 2024-02-02 NOTE — Therapy (Signed)
 OUTPATIENT PHYSICAL THERAPY NEURO TREATMENT/RECERT    Patient Name: Scott Wang MRN: [id number] DOB:Mar 22, 1941, 83 y.o., male Today's Date: 02/02/2024   PCP: [name], DO  REFERRING PROVIDER: Sheran Luz, MD      END OF SESSION:  PT End of Session - 02/02/24 1017     Visit Number 12    Number of Visits 17    Date for PT Re-Evaluation 02/12/24    Authorization Type [employer] Medicare-auth requested at eval    Authorization Time Period approved 17 PT visits from 12/17/2023-02/12/2024    Authorization - Visit Number 12    Authorization - Number of Visits 17    PT Start Time 1019    PT Stop Time 1103    PT Time Calculation (min) 44 min    Equipment Utilized During Treatment Gait belt    Activity Tolerance Patient limited by lethargy    Behavior During Therapy WFL for tasks assessed/performed                    Past Medical History:  Diagnosis Date   Carpal tunnel syndrome    bilateral, had shots in each one and "haven't had any problems since".   Diabetes mellitus    Hyperlipidemia    Hypertension    Parkinson's disease (HCC) 2021   Prostate cancer (HCC)    Spinal stenosis    Past Surgical History:  Procedure Laterality Date   HERNIA REPAIR     HERNIA REPAIR     INGUINAL HERNIA REPAIR     KNEE SURGERY  right   KNEE SURGERY Left    PROSTATE BIOPSY     RADIOACTIVE SEED IMPLANT N/A 09/07/2018   Procedure: RADIOACTIVE SEED IMPLANT/BRACHYTHERAPY IMPLANT;  Surgeon: Jerilee Field, MD;  Location: [facility];  Service: Urology;  Laterality: N/A;   SPACE OAR INSTILLATION N/A 09/07/2018   Procedure: SPACE OAR INSTILLATION;  Surgeon: Jerilee Field, MD;  Location: [facility];  Service: Urology;  Laterality: N/A;   Patient Active Problem List   Diagnosis Date Noted   AKI (acute kidney injury) (HCC) 09/10/2023   Orthostatic hypotension 11/20/2021   not drinking enough fluids 11/20/2021   Imbalance 11/20/2021   Memory  loss 11/20/2021   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   Muscle stiffness due to parkinson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;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   Parkinson's disease (HCC) 12/02/2020   Malignant neoplasm of prostate (HCC) 05/05/2018   Hypertension 06/10/2011   Hyperlipidemia 06/10/2011   Diabetes mellitus (HCC) 06/10/2011   Onychomycosis 06/10/2011   Paronychia 06/10/2011    ONSET DATE: 12/11/2023  REFERRING DIAG: R26.9 (ICD-10-CM) - Abnormality of gait and mobility   THERAPY DIAG:  Unsteadiness on feet  Other abnormalities of gait and mobility  Muscle weakness (generalized)  Other symptoms and signs involving the nervous system  Dizziness and giddiness  Rationale for Evaluation and Treatment: Rehabilitation  SUBJECTIVE:  SUBJECTIVE STATEMENT: Some days are better than others.  Feel dizziness when I lie down and when I get up fast.  Feels spinny.  Been going on for years, but not everyday.    Pt accompanied by: significant other, wife in lobby  PERTINENT HISTORY: BLE pain, severe spinal stenosis L4-5, L3-4, Parkinson's, R hip pain, BLE lymphedema  PAIN:  Are you having pain? No  PRECAUTIONS: Fall  RED FLAGS: None   WEIGHT BEARING RESTRICTIONS: No  FALLS: Has patient fallen in last 6 months? Yes. Number of falls 3  LIVING ENVIRONMENT: Lives with: lives with their spouse Lives in: House/apartment Stairs: Yes: External: 2-3 steps; can reach both Has following equipment at home: Single point cane, Walker - 2 wheeled, and Wheelchair (manual)  PLOF: Independent  PATIENT GOALS: Pt wants to get back to normal.  OBJECTIVE:   Neck ROM:  Flexion 32  Extension 24  R rotation 50  L rotation 55  TODAY'S TREATMENT: 02/02/2024 Activity Comments  R DH  negative  L DH + L upbeating nystagmus x 15 sec  L Epley maneuver Tolerated well, mild wooziness upon sitting up EOM  L DH Nystagmus noted more circular, spinning, then horizontal  R roll Geotrophic x > 1 min  L roll Apogeotrophic x > 1 min  [name] maneuver L Tolerates positions well, feels woozy slightly upon sitting up EOM      PATIENT EDUCATION: Education details: Positional vertigo (multi-canal) and treatment; post-CRM care to stay well hydrated, move safety in home and likely will reassess and treat as needed next visit Person educated: Patient Education method: Explanation and Verbal cues Education comprehension: verbalized understanding   HOME EXERCISE PROGRAM:  Access Code: [email or web address] URL: [email or web address] Date: 12/28/2023; 3/10/20205 Prepared by: [facility] - Outpatient  Rehab - [facility]  *VERBALLY ADDED:  Walking 2 minutes in hallway or laps in home, 3x/day with focus on POSTURE, step length/foot clearance-01/12/2024 *Verbally added PWR! Up, PWR! [place], [place]! Step in standing, 10 reps, 1-2x/day  Exercises - Sit to Stand  - 1 x daily - 7 x weekly - 3 sets - 5 reps - Seated March  - 1 x daily - 7 x weekly - 3 sets - 10 reps - Seated Long Arc Quad  - 1 x daily - 7 x weekly - 3 sets - 10 reps - 3 sec hold - Seated Ankle Dorsiflexion AROM  - 1 x daily - 7 x weekly - 3 sets - 10 reps - 3 sec hold - Standing Gastroc Stretch at Counter  - 1 x daily - 7 x weekly - 1 sets - 3 reps - 30 sec hold - Side Stepping with Counter Support  - 1 x daily - 5 x weekly - 2 sets - 10 reps - Alternating Step Backward with Support  - 1 x daily - 5 x weekly - 2 sets - 10 reps - Alternating Step Taps with Counter Support  - 1 x daily - 5 x weekly - 2 sets - 10 reps      --------------------------------------------------------------------------------------------------------------- Note: Objective measures were completed at Evaluation unless otherwise noted.  DIAGNOSTIC  FINDINGS: NA  COGNITION: Overall cognitive status: Within functional limits for tasks assessed   POSTURE: rounded shoulders, forward head, and bilat UT tremors  LOWER EXTREMITY ROM:     Active  Right Eval Left Eval  Hip flexion    Hip extension    Hip abduction    Hip adduction    Hip internal rotation  Hip external rotation    Knee flexion    Knee extension -10 -12  Ankle dorsiflexion    Ankle plantarflexion    Ankle inversion    Ankle eversion     (Blank rows = not tested)  LOWER EXTREMITY MMT:    MMT Right Eval Left Eval  Hip flexion 4 4  Hip extension    Hip abduction 4 4  Hip adduction 4 4  Hip internal rotation    Hip external rotation    Knee flexion 4 4  Knee extension 4 4  Ankle dorsiflexion 3+ 3+  Ankle plantarflexion    Ankle inversion    Ankle eversion    (Blank rows = not tested)   TRANSFERS: Assistive device utilized: None  Sit to stand: SBA Stand to sit: SBA   GAIT: Gait pattern: step to pattern, step through pattern, decreased arm swing- Right, decreased arm swing- Left, decreased step length- Right, decreased step length- Left, shuffling, poor foot clearance- Right, and poor foot clearance- Left Distance walked: 50 ft x 2 Assistive device utilized: None Level of assistance: SBA Comments: increased BUE tremor, shuffling gait pattern, forward flexed posture  FUNCTIONAL TESTS:  5 times sit to stand: 20.22 sec Timed up and go (TUG): 29.72 sec 10 meter walk test: 19.97 sec = 1.64 ft/sec 360 turn:   R 12 steps   L 9 steps 43M backward walk:  19.93 sec TUG cognitive:30.89 sec TUG manual:  23.5 sec                                                                                                                               TREATMENT DATE: 12/17/2023    PATIENT EDUCATION: Education details: Eval results, POC, HEP initiated Person educated: Patient Education method: Explanation, Demonstration, and Handouts Education comprehension:  verbalized understanding  HOME EXERCISE PROGRAM: Access Code: [email or web address] URL: [email or web address] Date: 12/17/2023 Prepared by: [facility] - Outpatient  Rehab - [facility]  Exercises - Sit to Stand  - 1 x daily - 7 x weekly - 3 sets - 5 reps - Seated March  - 1 x daily - 7 x weekly - 3 sets - 10 reps - Seated Long Arc Quad  - 1 x daily - 7 x weekly - 3 sets - 10 reps - 3 sec hold - Seated Ankle Dorsiflexion AROM  - 1 x daily - 7 x weekly - 3 sets - 10 reps - 3 sec hold  GOALS: Goals reviewed with patient? Yes  SHORT TERM GOALS: Target date: 01/15/2024  Pt will be independent with HEP for improved strength, balance, gait. Baseline: Goal status: MET per report 01/14/2024  2.  Pt will improve 5x sit<>stand to less than or equal to 15 sec to demonstrate improved functional strength and transfer efficiency. Baseline: 20.22 sec>16.22 sec 01/14/2024 Goal status:PARTIALLY MET 01/15/2024  3.  Pt will improve TUG score to less than or equal to 20 sec for decreased  fall risk. Baseline: 29.72 sec>12.66 sec 01/14/2024 Goal status: MET, 01/14/2024  LONG TERM GOALS: Target date: 02/12/2024  Pt will be independent with progression of HEP for improved balance, gait, strength. Baseline:  Goal status: IN PROGRESS  2.  Pt will improve gait velocity to at least 2.6 ft/sec for improved gait efficiency and safety. Baseline: 1.64 ft/sec Goal status: IN PROGRESS  3.  Pt will improve TUG score to less than or equal to 15 sec for decreased fall risk. Baseline: 29.72 sec Goal status: IN PROGRESS  4.  Pt will perform 54M Walk test Backwards in < or equal to 10 seconds for improved balance in posterior direction. Baseline: 19 sec Goal status: IN PROGRESS  5.  Pt will verbalize plans for continued community fitness upon d/c from PT to maximize gains made in PT. Baseline:  Goal status: IN PROGRESS  6.  Pt will report 0/10 dizziness with bed mobility.  Baseline:  dizziness with quick up to  sit and with lying down to L side  Goal status:  INITIAL, 02/02/2024 ASSESSMENT:  CLINICAL IMPRESSION: Pt presents today and reports some days are better than others. Skilled PT session focused on assessing positional vertigo, which pt reports comes on when he lies down to L side (briefly and then goes away) and will come on when he gets up quickly. With positional testing initially, pt demo L upbeating rotary nystagmus and symptoms in L Dix-Hallpike position.  Treated with L Epley, which pt tolerated well.  With retest, pt begins to feel very dizzy in L DH position and nystagmus changes to horizontal.  With R and L horizontal roll test, pt has apogeotropic nystagmus > 1 min in L roll position, also nystagmus and symptoms to R roll.  Treated L horizontal cupulolisthiasis with L [name] Maneuver.  Pt tolerates well, but is woozy upon sitting up EOM.  Educated pt (and wife at end of session) about post-CRM care and plans to reassess next time, as pt may have multi-canal involvement with BPPV.  Pt will continue to benefit from skilled PT towards goals for improved functional mobility and decreased fall risk.   OBJECTIVE IMPAIRMENTS: Abnormal gait, decreased balance, decreased mobility, difficulty walking, decreased strength, impaired flexibility, and postural dysfunction.   ACTIVITY LIMITATIONS: standing, transfers, and locomotion level  PARTICIPATION LIMITATIONS: shopping, community activity, and yard work  PERSONAL FACTORS: 3+ comorbidities: see PMH  are also affecting patient's functional outcome.   REHAB POTENTIAL: Good  CLINICAL DECISION MAKING: Evolving/moderate complexity  EVALUATION COMPLEXITY: Moderate  PLAN:  PT FREQUENCY: 2x/week  PT DURATION: 8 weeks plus eval visit   PLANNED INTERVENTIONS: 97110-Therapeutic exercises, 97530- Therapeutic activity, [id number]- Neuromuscular re-education, 97535- Self Care, 11914- Manual therapy, [phone]- Gait training, [phone]- Canalith repositioning,  Patient/Family education, Balance training, Vestibular training, and DME instructions  PLAN FOR NEXT SESSION: Recert completed today to include treatment for BPPV.  Retest BPPV and treat as needed.  May consider adding habituation exercises.  Trial gait with RW.  Progress exercises for home to include balance; review PWR! Moves added to HEP this visit; activities to increase step length/foot clearance; compliant surfaces.     Gean Maidens., PT 02/02/2024, 11:41 AM  [facility] at [facility] [address] [place], [address] [place], [place] [address] Phone # [phone] Fax # [phone]

## 2024-02-03 DIAGNOSIS — R278 Other lack of coordination: Secondary | ICD-10-CM | POA: Diagnosis not present

## 2024-02-03 DIAGNOSIS — R1310 Dysphagia, unspecified: Secondary | ICD-10-CM | POA: Diagnosis not present

## 2024-02-03 DIAGNOSIS — M6281 Muscle weakness (generalized): Secondary | ICD-10-CM | POA: Diagnosis not present

## 2024-02-03 DIAGNOSIS — G20C Parkinsonism, unspecified: Secondary | ICD-10-CM | POA: Diagnosis not present

## 2024-02-03 DIAGNOSIS — E222 Syndrome of inappropriate secretion of antidiuretic hormone: Secondary | ICD-10-CM | POA: Diagnosis not present

## 2024-02-03 DIAGNOSIS — A419 Sepsis, unspecified organism: Secondary | ICD-10-CM | POA: Diagnosis not present

## 2024-02-03 DIAGNOSIS — N179 Acute kidney failure, unspecified: Secondary | ICD-10-CM | POA: Diagnosis not present

## 2024-02-04 ENCOUNTER — Ambulatory Visit: Admitting: Physical Therapy

## 2024-02-04 ENCOUNTER — Encounter: Payer: Self-pay | Admitting: Physical Therapy

## 2024-02-04 DIAGNOSIS — M6281 Muscle weakness (generalized): Secondary | ICD-10-CM

## 2024-02-04 DIAGNOSIS — R42 Dizziness and giddiness: Secondary | ICD-10-CM | POA: Diagnosis not present

## 2024-02-04 DIAGNOSIS — R2681 Unsteadiness on feet: Secondary | ICD-10-CM

## 2024-02-04 DIAGNOSIS — R29818 Other symptoms and signs involving the nervous system: Secondary | ICD-10-CM | POA: Diagnosis not present

## 2024-02-04 DIAGNOSIS — R2689 Other abnormalities of gait and mobility: Secondary | ICD-10-CM

## 2024-02-04 NOTE — Therapy (Signed)
 OUTPATIENT PHYSICAL THERAPY NEURO TREATMENT   Patient Name: Scott Wang MRN: [id number] DOB:1940/11/28, 83 y.o., male Today's Date: 02/04/2024   PCP: [name], DO  REFERRING PROVIDER: Sheran Luz, MD      END OF SESSION:  PT End of Session - 02/04/24 1018     Visit Number 13    Number of Visits 17    Date for PT Re-Evaluation 02/12/24    Authorization Type [employer] Medicare-auth requested at eval    Authorization Time Period approved 17 PT visits from 12/17/2023-02/12/2024    Authorization - Visit Number 13    Authorization - Number of Visits 17    PT Start Time 1020    PT Stop Time 1100    PT Time Calculation (min) 40 min    Equipment Utilized During Treatment Gait belt    Activity Tolerance Patient tolerated treatment well    Behavior During Therapy WFL for tasks assessed/performed                     Past Medical History:  Diagnosis Date   Carpal tunnel syndrome    bilateral, had shots in each one and "haven't had any problems since".   Diabetes mellitus    Hyperlipidemia    Hypertension    Parkinson's disease (HCC) 2021   Prostate cancer (HCC)    Spinal stenosis    Past Surgical History:  Procedure Laterality Date   HERNIA REPAIR     HERNIA REPAIR     INGUINAL HERNIA REPAIR     KNEE SURGERY  right   KNEE SURGERY Left    PROSTATE BIOPSY     RADIOACTIVE SEED IMPLANT N/A 09/07/2018   Procedure: RADIOACTIVE SEED IMPLANT/BRACHYTHERAPY IMPLANT;  Surgeon: Jerilee Field, MD;  Location: [facility];  Service: Urology;  Laterality: N/A;   SPACE OAR INSTILLATION N/A 09/07/2018   Procedure: SPACE OAR INSTILLATION;  Surgeon: Jerilee Field, MD;  Location: [facility];  Service: Urology;  Laterality: N/A;   Patient Active Problem List   Diagnosis Date Noted   AKI (acute kidney injury) (HCC) 09/10/2023   Orthostatic hypotension 11/20/2021   not drinking enough fluids 11/20/2021   Imbalance 11/20/2021   Memory  loss 11/20/2021   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   Muscle stiffness due to parkinson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;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   Parkinson's disease (HCC) 12/02/2020   Malignant neoplasm of prostate (HCC) 05/05/2018   Hypertension 06/10/2011   Hyperlipidemia 06/10/2011   Diabetes mellitus (HCC) 06/10/2011   Onychomycosis 06/10/2011   Paronychia 06/10/2011    ONSET DATE: 12/11/2023  REFERRING DIAG: R26.9 (ICD-10-CM) - Abnormality of gait and mobility   THERAPY DIAG:  Unsteadiness on feet  Other abnormalities of gait and mobility  Dizziness and giddiness  Muscle weakness (generalized)  Rationale for Evaluation and Treatment: Rehabilitation  SUBJECTIVE:  SUBJECTIVE STATEMENT: The afternoon after I came here, I had to bend over twice to help my wife; I got very dizzy and nauseous.  Since then, I've been better.   No dizziness noted with getting out of bed, even.   Pt accompanied by: significant other, wife in lobby  PERTINENT HISTORY: BLE pain, severe spinal stenosis L4-5, L3-4, Parkinson's, R hip pain, BLE lymphedema  PAIN:  Are you having pain? No  PRECAUTIONS: Fall  RED FLAGS: None   WEIGHT BEARING RESTRICTIONS: No  FALLS: Has patient fallen in last 6 months? Yes. Number of falls 3  LIVING ENVIRONMENT: Lives with: lives with their spouse Lives in: House/apartment Stairs: Yes: External: 2-3 steps; can reach both Has following equipment at home: Single point cane, Walker - 2 wheeled, and Wheelchair (manual)  PLOF: Independent  PATIENT GOALS: Pt wants to get back to normal.  OBJECTIVE:    TODAY'S TREATMENT: 02/04/2024 Activity Comments  R DH Negative  L DH Negative  R roll Negative  L roll Negative  Gastroc stretch at incline 5  x 10"   Alt step taps to 6" step, 2 x 10 reps Cues for foot clearance, BUE support>1 UE support  Forward step taps over obstacle, 2 x 10 reps; side step taps over obstacle, 2 x 10 BUE support>1 UE support, cues for increased foot clearance 2nd 5 reps (and pt able to sustain!)  Standing on Airex: Minisquats x 10, minisquats to up on toes Minisquats to overhead lifts with 3.3# ball Dynamic balance with trunk rotation and diagonals with min guard   On Airex: Side step to floor/return to midline x 10 Forward/back step and weightshift x 10 BUE>1UE support          PATIENT EDUCATION: Education details: BPPV appears to be cleared, continue to stay well-hydrated and move throughout the day. Person educated: Patient Education method: Explanation and Verbal cues Education comprehension: verbalized understanding   HOME EXERCISE PROGRAM:  Access Code: [email or web address] URL: [email or web address] Date: 12/28/2023; 3/10/20205 Prepared by: [facility] - Outpatient  Rehab - [facility]  *VERBALLY ADDED:  Walking 2 minutes in hallway or laps in home, 3x/day with focus on POSTURE, step length/foot clearance-01/12/2024 *Verbally added PWR! Up, PWR! [place], [place]! Step in standing, 10 reps, 1-2x/day  Exercises - Sit to Stand  - 1 x daily - 7 x weekly - 3 sets - 5 reps - Seated March  - 1 x daily - 7 x weekly - 3 sets - 10 reps - Seated Long Arc Quad  - 1 x daily - 7 x weekly - 3 sets - 10 reps - 3 sec hold - Seated Ankle Dorsiflexion AROM  - 1 x daily - 7 x weekly - 3 sets - 10 reps - 3 sec hold - Standing Gastroc Stretch at Counter  - 1 x daily - 7 x weekly - 1 sets - 3 reps - 30 sec hold - Side Stepping with Counter Support  - 1 x daily - 5 x weekly - 2 sets - 10 reps - Alternating Step Backward with Support  - 1 x daily - 5 x weekly - 2 sets - 10 reps - Alternating Step Taps with Counter Support  - 1 x daily - 5 x weekly - 2 sets - 10 reps       --------------------------------------------------------------------------------------------------------------- Note: Objective measures were completed at Evaluation unless otherwise noted.  DIAGNOSTIC FINDINGS: NA  COGNITION: Overall cognitive status: Within functional limits for tasks assessed   POSTURE: rounded  shoulders, forward head, and bilat UT tremors  LOWER EXTREMITY ROM:     Active  Right Eval Left Eval  Hip flexion    Hip extension    Hip abduction    Hip adduction    Hip internal rotation    Hip external rotation    Knee flexion    Knee extension -10 -12  Ankle dorsiflexion    Ankle plantarflexion    Ankle inversion    Ankle eversion     (Blank rows = not tested)  LOWER EXTREMITY MMT:    MMT Right Eval Left Eval  Hip flexion 4 4  Hip extension    Hip abduction 4 4  Hip adduction 4 4  Hip internal rotation    Hip external rotation    Knee flexion 4 4  Knee extension 4 4  Ankle dorsiflexion 3+ 3+  Ankle plantarflexion    Ankle inversion    Ankle eversion    (Blank rows = not tested)   TRANSFERS: Assistive device utilized: None  Sit to stand: SBA Stand to sit: SBA   GAIT: Gait pattern: step to pattern, step through pattern, decreased arm swing- Right, decreased arm swing- Left, decreased step length- Right, decreased step length- Left, shuffling, poor foot clearance- Right, and poor foot clearance- Left Distance walked: 50 ft x 2 Assistive device utilized: None Level of assistance: SBA Comments: increased BUE tremor, shuffling gait pattern, forward flexed posture  FUNCTIONAL TESTS:  5 times sit to stand: 20.22 sec Timed up and go (TUG): 29.72 sec 10 meter walk test: 19.97 sec = 1.64 ft/sec 360 turn:   R 12 steps   L 9 steps 65M backward walk:  19.93 sec TUG cognitive:30.89 sec TUG manual:  23.5 sec                                                                                                                               TREATMENT  DATE: 12/17/2023    PATIENT EDUCATION: Education details: Eval results, POC, HEP initiated Person educated: Patient Education method: Explanation, Demonstration, and Handouts Education comprehension: verbalized understanding  HOME EXERCISE PROGRAM: Access Code: [email or web address] URL: [email or web address] Date: 12/17/2023 Prepared by: [facility] - Outpatient  Rehab - [facility]  Exercises - Sit to Stand  - 1 x daily - 7 x weekly - 3 sets - 5 reps - Seated March  - 1 x daily - 7 x weekly - 3 sets - 10 reps - Seated Long Arc Quad  - 1 x daily - 7 x weekly - 3 sets - 10 reps - 3 sec hold - Seated Ankle Dorsiflexion AROM  - 1 x daily - 7 x weekly - 3 sets - 10 reps - 3 sec hold  GOALS: Goals reviewed with patient? Yes  SHORT TERM GOALS: Target date: 01/15/2024  Pt will be independent with HEP for improved strength, balance, gait. Baseline: Goal status: MET per report 01/14/2024  2.  Pt will improve 5x sit<>stand to less than or equal to 15 sec to demonstrate improved functional strength and transfer efficiency. Baseline: 20.22 sec>16.22 sec 01/14/2024 Goal status:PARTIALLY MET 01/15/2024  3.  Pt will improve TUG score to less than or equal to 20 sec for decreased fall risk. Baseline: 29.72 sec>12.66 sec 01/14/2024 Goal status: MET, 01/14/2024  LONG TERM GOALS: Target date: 02/12/2024  Pt will be independent with progression of HEP for improved balance, gait, strength. Baseline:  Goal status: IN PROGRESS  2.  Pt will improve gait velocity to at least 2.6 ft/sec for improved gait efficiency and safety. Baseline: 1.64 ft/sec Goal status: IN PROGRESS  3.  Pt will improve TUG score to less than or equal to 15 sec for decreased fall risk. Baseline: 29.72 sec Goal status: IN PROGRESS  4.  Pt will perform 65M Walk test Backwards in < or equal to 10 seconds for improved balance in posterior direction. Baseline: 19 sec Goal status: IN PROGRESS  5.  Pt will verbalize plans for  continued community fitness upon d/c from PT to maximize gains made in PT. Baseline:  Goal status: IN PROGRESS  6.  Pt will report 0/10 dizziness with bed mobility.  Baseline:  dizziness with quick up to sit and with lying down to L side  Goal status:  INITIAL, 02/02/2024 ASSESSMENT:  CLINICAL IMPRESSION: Pt presents today and reports that he noted dizziness and nausea after bending over following last session, but after that, he has had no symptoms of vertigo.  Reassessed for positional vertigo today and pt has no nystagmus or symptoms in any position.  Skilled PT session then focused on balance-step strategies, compliant surface work. Pt is able to transition from BUE support to 1 UE support with most activities, and with minor cueing, pt is able to sustain larger movements throughout full set of 10 reps of stepping activities, which is improved from previous visits.  He seems move overall more confidently today.  He will continue to benefit from skilled PT towards goals for improved functional mobility and decreased fall risk.   OBJECTIVE IMPAIRMENTS: Abnormal gait, decreased balance, decreased mobility, difficulty walking, decreased strength, impaired flexibility, and postural dysfunction.   ACTIVITY LIMITATIONS: standing, transfers, and locomotion level  PARTICIPATION LIMITATIONS: shopping, community activity, and yard work  PERSONAL FACTORS: 3+ comorbidities: see PMH  are also affecting patient's functional outcome.   REHAB POTENTIAL: Good  CLINICAL DECISION MAKING: Evolving/moderate complexity  EVALUATION COMPLEXITY: Moderate  PLAN:  PT FREQUENCY: 2x/week  PT DURATION: 8 weeks plus eval visit   PLANNED INTERVENTIONS: 97110-Therapeutic exercises, 97530- Therapeutic activity, [id number]- Neuromuscular re-education, 97535- Self Care, 16109- Manual therapy, [id number]- Gait training, [phone]- Canalith repositioning, Patient/Family education, Balance training, Vestibular training, and DME  instructions  PLAN FOR NEXT SESSION: Check LTGs and discuss POC.  Ask about any other vertigio; may consider adding habituation exercises as needed.  Trial gait with RW.  Progress exercises for home to include balance; review PWR! Moves added to HEP this visit; activities to increase step length/foot clearance; compliant surfaces.     Gean Maidens., PT 02/04/2024, 11:10 AM  [facility] at [facility] [address] [place], [address] [place], [place] [address] Phone # [phone] Fax # [phone]

## 2024-02-09 ENCOUNTER — Ambulatory Visit: Attending: Physical Medicine and Rehabilitation | Admitting: Physical Therapy

## 2024-02-09 ENCOUNTER — Encounter: Payer: Self-pay | Admitting: Physical Therapy

## 2024-02-09 DIAGNOSIS — R29818 Other symptoms and signs involving the nervous system: Secondary | ICD-10-CM

## 2024-02-09 DIAGNOSIS — R2681 Unsteadiness on feet: Secondary | ICD-10-CM | POA: Diagnosis not present

## 2024-02-09 DIAGNOSIS — R2689 Other abnormalities of gait and mobility: Secondary | ICD-10-CM

## 2024-02-09 DIAGNOSIS — M6281 Muscle weakness (generalized): Secondary | ICD-10-CM | POA: Diagnosis not present

## 2024-02-09 NOTE — Therapy (Signed)
 OUTPATIENT PHYSICAL THERAPY NEURO TREATMENT   Patient Name: Scott Wang MRN: [id number] DOB:1940/12/16, 83 y.o., male Today's Date: 02/09/2024   PCP: [name], DO  REFERRING PROVIDER: Sheran Luz, MD      END OF SESSION:  PT End of Session - 02/09/24 1020     Visit Number 14    Number of Visits 17    Date for PT Re-Evaluation 02/12/24    Authorization Type [employer] Medicare-auth requested at eval    Authorization Time Period approved 17 PT visits from 12/17/2023-02/12/2024    Authorization - Visit Number 14    Authorization - Number of Visits 17    PT Start Time 1019    PT Stop Time 1057    PT Time Calculation (min) 38 min    Equipment Utilized During Treatment Gait belt    Activity Tolerance Patient tolerated treatment well    Behavior During Therapy WFL for tasks assessed/performed                      Past Medical History:  Diagnosis Date   Carpal tunnel syndrome    bilateral, had shots in each one and "haven't had any problems since".   Diabetes mellitus    Hyperlipidemia    Hypertension    Parkinson's disease (HCC) 2021   Prostate cancer (HCC)    Spinal stenosis    Past Surgical History:  Procedure Laterality Date   HERNIA REPAIR     HERNIA REPAIR     INGUINAL HERNIA REPAIR     KNEE SURGERY  right   KNEE SURGERY Left    PROSTATE BIOPSY     RADIOACTIVE SEED IMPLANT N/A 09/07/2018   Procedure: RADIOACTIVE SEED IMPLANT/BRACHYTHERAPY IMPLANT;  Surgeon: Jerilee Field, MD;  Location: [facility];  Service: Urology;  Laterality: N/A;   SPACE OAR INSTILLATION N/A 09/07/2018   Procedure: SPACE OAR INSTILLATION;  Surgeon: Jerilee Field, MD;  Location: [facility];  Service: Urology;  Laterality: N/A;   Patient Active Problem List   Diagnosis Date Noted   AKI (acute kidney injury) (HCC) 09/10/2023   Orthostatic hypotension 11/20/2021   not drinking enough fluids 11/20/2021   Imbalance 11/20/2021   Memory  loss 11/20/2021   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   Muscle stiffness due to parkinson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;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   Parkinson's disease (HCC) 12/02/2020   Malignant neoplasm of prostate (HCC) 05/05/2018   Hypertension 06/10/2011   Hyperlipidemia 06/10/2011   Diabetes mellitus (HCC) 06/10/2011   Onychomycosis 06/10/2011   Paronychia 06/10/2011    ONSET DATE: 12/11/2023  REFERRING DIAG: R26.9 (ICD-10-CM) - Abnormality of gait and mobility   THERAPY DIAG:  Unsteadiness on feet  Other abnormalities of gait and mobility  Muscle weakness (generalized)  Other symptoms and signs involving the nervous system  Rationale for Evaluation and Treatment: Rehabilitation  SUBJECTIVE:  SUBJECTIVE STATEMENT: Did a lot in the yard over the weekend.  No falls.  Pt accompanied by: significant other, wife in lobby  PERTINENT HISTORY: BLE pain, severe spinal stenosis L4-5, L3-4, Parkinson's, R hip pain, BLE lymphedema  PAIN:  Are you having pain? No  PRECAUTIONS: Fall  RED FLAGS: None   WEIGHT BEARING RESTRICTIONS: No  FALLS: Has patient fallen in last 6 months? Yes. Number of falls 3  LIVING ENVIRONMENT: Lives with: lives with their spouse Lives in: House/apartment Stairs: Yes: External: 2-3 steps; can reach both Has following equipment at home: Single point cane, Walker - 2 wheeled, and Wheelchair (manual)  PLOF: Independent  PATIENT GOALS: Pt wants to get back to normal.  OBJECTIVE:    TODAY'S TREATMENT: 02/09/2024 Activity Comments  Thorough review of HEP-see below Min cues for correct technique for several exercises  SLS with foot propped at 4" block, 2 x 10", each foot Min guard, no UE support  Gait with resisted band at hips>gait  with no resistance Cues for arm swing, posture, step length               PATIENT EDUCATION: Education details: Importance of consistent HEP, deliberate/exaggerated effort level with exercises; use of foot pedaler bike 3-5x/week (10-15 minutes); walking 3-5 min/several times a day Person educated: Patient Education method: Explanation and Verbal cues Education comprehension: verbalized understanding   HOME EXERCISE PROGRAM:  Access Code: [email or web address] URL: [email or web address] Date: 12/28/2023; 3/10/20205 Prepared by: [facility] - Outpatient  Rehab - [facility]  *VERBALLY ADDED:  Walking 2 minutes in hallway or laps in home, 3x/day with focus on POSTURE, step length/foot clearance-01/12/2024 *Verbally added PWR! Up, PWR! [place], [place]! Step in standing, 10 reps, 1-2x/day  Exercises - Sit to Stand  - 1 x daily - 7 x weekly - 3 sets - 5 reps - Seated March  - 1 x daily - 7 x weekly - 3 sets - 10 reps - Seated Long Arc Quad  - 1 x daily - 7 x weekly - 3 sets - 10 reps - 3 sec hold - Seated Ankle Dorsiflexion AROM  - 1 x daily - 7 x weekly - 3 sets - 10 reps - 3 sec hold - Standing Gastroc Stretch at Counter  - 1 x daily - 7 x weekly - 1 sets - 3 reps - 30 sec hold - Side Stepping with Counter Support  - 1 x daily - 5 x weekly - 2 sets - 10 reps - Alternating Step Backward with Support  - 1 x daily - 5 x weekly - 2 sets - 10 reps - Alternating Step Taps with Counter Support  - 1 x daily - 5 x weekly - 2 sets - 10 reps      --------------------------------------------------------------------------------------------------------------- Note: Objective measures were completed at Evaluation unless otherwise noted.  DIAGNOSTIC FINDINGS: NA  COGNITION: Overall cognitive status: Within functional limits for tasks assessed   POSTURE: rounded shoulders, forward head, and bilat UT tremors  LOWER EXTREMITY ROM:     Active  Right Eval Left Eval  Hip flexion    Hip  extension    Hip abduction    Hip adduction    Hip internal rotation    Hip external rotation    Knee flexion    Knee extension -10 -12  Ankle dorsiflexion    Ankle plantarflexion    Ankle inversion    Ankle eversion     (Blank rows = not tested)  LOWER EXTREMITY  MMT:    MMT Right Eval Left Eval  Hip flexion 4 4  Hip extension    Hip abduction 4 4  Hip adduction 4 4  Hip internal rotation    Hip external rotation    Knee flexion 4 4  Knee extension 4 4  Ankle dorsiflexion 3+ 3+  Ankle plantarflexion    Ankle inversion    Ankle eversion    (Blank rows = not tested)   TRANSFERS: Assistive device utilized: None  Sit to stand: SBA Stand to sit: SBA   GAIT: Gait pattern: step to pattern, step through pattern, decreased arm swing- Right, decreased arm swing- Left, decreased step length- Right, decreased step length- Left, shuffling, poor foot clearance- Right, and poor foot clearance- Left Distance walked: 50 ft x 2 Assistive device utilized: None Level of assistance: SBA Comments: increased BUE tremor, shuffling gait pattern, forward flexed posture  FUNCTIONAL TESTS:  5 times sit to stand: 20.22 sec Timed up and go (TUG): 29.72 sec 10 meter walk test: 19.97 sec = 1.64 ft/sec 360 turn:   R 12 steps   L 9 steps 63M backward walk:  19.93 sec TUG cognitive:30.89 sec TUG manual:  23.5 sec                                                                                                                               TREATMENT DATE: 12/17/2023    PATIENT EDUCATION: Education details: Eval results, POC, HEP initiated Person educated: Patient Education method: Explanation, Demonstration, and Handouts Education comprehension: verbalized understanding  HOME EXERCISE PROGRAM: Access Code: [email or web address] URL: [email or web address] Date: 12/17/2023 Prepared by: [facility] - Outpatient  Rehab - [facility]  Exercises - Sit to Stand  - 1 x daily - 7 x weekly  - 3 sets - 5 reps - Seated March  - 1 x daily - 7 x weekly - 3 sets - 10 reps - Seated Long Arc Quad  - 1 x daily - 7 x weekly - 3 sets - 10 reps - 3 sec hold - Seated Ankle Dorsiflexion AROM  - 1 x daily - 7 x weekly - 3 sets - 10 reps - 3 sec hold  GOALS: Goals reviewed with patient? Yes  SHORT TERM GOALS: Target date: 01/15/2024  Pt will be independent with HEP for improved strength, balance, gait. Baseline: Goal status: MET per report 01/14/2024  2.  Pt will improve 5x sit<>stand to less than or equal to 15 sec to demonstrate improved functional strength and transfer efficiency. Baseline: 20.22 sec>16.22 sec 01/14/2024 Goal status:PARTIALLY MET 01/15/2024  3.  Pt will improve TUG score to less than or equal to 20 sec for decreased fall risk. Baseline: 29.72 sec>12.66 sec 01/14/2024 Goal status: MET, 01/14/2024  LONG TERM GOALS: Target date: 02/12/2024  Pt will be independent with progression of HEP for improved balance, gait, strength. Baseline: min cues, but pt reports doing 2x/day Goal status: MET, 02/09/2024  2.  Pt will improve gait velocity to at least 2.6 ft/sec for improved gait efficiency and safety. Baseline: 1.64 ft/sec Goal status: IN PROGRESS  3.  Pt will improve TUG score to less than or equal to 15 sec for decreased fall risk. Baseline: 29.72 sec Goal status: IN PROGRESS  4.  Pt will perform 13M Walk test Backwards in < or equal to 10 seconds for improved balance in posterior direction. Baseline: 19 sec Goal status: IN PROGRESS  5.  Pt will verbalize plans for continued community fitness upon d/c from PT to maximize gains made in PT. Baseline: he reports he will not likely join classes; may use son's exercise equipment Goal status: MET, 02/09/2024  6.  Pt will report 0/10 dizziness with bed mobility.  Baseline:  dizziness with quick up to sit and with lying down to L side  Goal status:  INITIAL, 02/02/2024 ASSESSMENT:  CLINICAL IMPRESSION: Pt presents today with no new  complaints. Skilled PT session focused on thorough review of HEP.  He is able to perform HEP, but he needs cues for increased deliberate effort for larger amplitude movement patterns.  With min cues, he is able to better sustain larger movement patterns.  Discussed post-therapy exercise routine, and he will likely use son's exercise equipment, as he does not voice interest in participating in community classes.  He has met LTG 1 and LTG 5.  He has had no other dizziness complaint.  Will look at LTGs next visit.  OBJECTIVE IMPAIRMENTS: Abnormal gait, decreased balance, decreased mobility, difficulty walking, decreased strength, impaired flexibility, and postural dysfunction.   ACTIVITY LIMITATIONS: standing, transfers, and locomotion level  PARTICIPATION LIMITATIONS: shopping, community activity, and yard work  PERSONAL FACTORS: 3+ comorbidities: see PMH  are also affecting patient's functional outcome.   REHAB POTENTIAL: Good  CLINICAL DECISION MAKING: Evolving/moderate complexity  EVALUATION COMPLEXITY: Moderate  PLAN:  PT FREQUENCY: 2x/week  PT DURATION: 8 weeks plus eval visit   PLANNED INTERVENTIONS: 97110-Therapeutic exercises, 97530- Therapeutic activity, [id number]- Neuromuscular re-education, 97535- Self Care, 16109- Manual therapy, [id number]- Gait training, [phone]- Canalith repositioning, Patient/Family education, Balance training, Vestibular training, and DME instructions  PLAN FOR NEXT SESSION: Check remaining LTGs and discuss POC.  Ask about any other vertigio; may consider adding habituation exercises as needed.  Trial gait with RW.  Progress exercises for home to include balance; review PWR! Moves added to HEP this visit; activities to increase step length/foot clearance; compliant surfaces.     Gean Maidens., PT 02/09/2024, 10:58 AM  [facility] at [facility] [address] [place], [address] [place], [place] [address] Phone # [phone] Fax # [phone]

## 2024-02-11 ENCOUNTER — Ambulatory Visit: Admitting: Physical Therapy

## 2024-02-11 ENCOUNTER — Encounter: Payer: Self-pay | Admitting: Physical Therapy

## 2024-02-11 DIAGNOSIS — R2689 Other abnormalities of gait and mobility: Secondary | ICD-10-CM | POA: Diagnosis not present

## 2024-02-11 DIAGNOSIS — R29818 Other symptoms and signs involving the nervous system: Secondary | ICD-10-CM | POA: Diagnosis not present

## 2024-02-11 DIAGNOSIS — M6281 Muscle weakness (generalized): Secondary | ICD-10-CM

## 2024-02-11 DIAGNOSIS — R2681 Unsteadiness on feet: Secondary | ICD-10-CM

## 2024-02-11 NOTE — Therapy (Signed)
 OUTPATIENT PHYSICAL THERAPY NEURO TREATMENT   Patient Name: Scott Wang MRN: [id number] DOB:1941-06-30, 83 y.o., male Today's Date: 02/11/2024   PCP: [name], DO  REFERRING PROVIDER: Sheran Luz, MD      END OF SESSION:  PT End of Session - 02/11/24 1022     Visit Number 15    Number of Visits 17    Date for PT Re-Evaluation 02/12/24    Authorization Type [employer]-auth requested at eval    Authorization Time Period approved 17 PT visits from 12/17/2023-02/12/2024    Authorization - Visit Number 15    Authorization - Number of Visits 17    PT Start Time 1019    PT Stop Time 1059    PT Time Calculation (min) 40 min    Equipment Utilized During Treatment Gait belt    Activity Tolerance Patient tolerated treatment well    Behavior During Therapy WFL for tasks assessed/performed                       Past Medical History:  Diagnosis Date   Carpal tunnel syndrome    bilateral, had shots in each one and "haven't had any problems since".   Diabetes mellitus    Hyperlipidemia    Hypertension    Parkinson's disease (HCC) 2021   Prostate cancer (HCC)    Spinal stenosis    Past Surgical History:  Procedure Laterality Date   HERNIA REPAIR     HERNIA REPAIR     INGUINAL HERNIA REPAIR     KNEE SURGERY  right   KNEE SURGERY Left    PROSTATE BIOPSY     RADIOACTIVE SEED IMPLANT N/A 09/07/2018   Procedure: RADIOACTIVE SEED IMPLANT/BRACHYTHERAPY IMPLANT;  Surgeon: Jerilee Field, MD;  Location: [facility];  Service: Urology;  Laterality: N/A;   SPACE OAR INSTILLATION N/A 09/07/2018   Procedure: SPACE OAR INSTILLATION;  Surgeon: Jerilee Field, MD;  Location: [facility];  Service: Urology;  Laterality: N/A;   Patient Active Problem List   Diagnosis Date Noted   AKI (acute kidney injury) (HCC) 09/10/2023   Orthostatic hypotension 11/20/2021   not drinking enough fluids 11/20/2021   Imbalance 11/20/2021   Memory  loss 11/20/2021   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   Muscle stiffness due to parkinson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  Just balance is a problem  Pt accompanied by: significant other, wife in lobby  PERTINENT HISTORY: BLE pain, severe spinal stenosis L4-5, L3-4, Parkinson's, R hip pain, BLE lymphedema  PAIN:  Are you having pain? No  PRECAUTIONS: Fall  RED FLAGS: None   WEIGHT BEARING RESTRICTIONS: No  FALLS: Has patient fallen in last 6 months? Yes. Number of falls 3  LIVING ENVIRONMENT: Lives with: lives with their spouse Lives in: House/apartment Stairs: Yes: External: 2-3 steps; can reach both Has following equipment at home: Single point cane, Walker - 2 wheeled, and Wheelchair (manual)  PLOF: Independent  PATIENT GOALS: Pt wants to get back to normal.  OBJECTIVE:    TODAY'S TREATMENT: 02/11/2024 Activity Comments  22M walk:  13.82 sec = 2.37 ft/sec Improved from 1.64 ft/sec  21M walk backwards:  8.41 sec, 7.56 sec Improved from 19.93 sec  TUG:  10.94 sec Improved from 29 sec  FTSTS:  16.47 sec, 14.5 sec Improved from >20 sec  MiniBESTest:  12/28 Scores <22/28  indicate increased fall risk       [facility] PT Assessment - 02/11/24 1038       Standardized Balance Assessment   Standardized Balance Assessment Mini-BESTest      Mini-BESTest   Sit To Stand Normal: Comes to stand without use of hands and stabilizes independently.    Rise to Toes < 3 s.   2.4 sec   Stand on one leg (left) Moderate: < 20 s   1.25, 3.43   Stand on one leg (right) Moderate: < 20 s   3, 3.37 with posterior LOB   Stand on one leg - lowest score 1    Compensatory Stepping Correction - Forward No step, OR would fall if not caught, OR falls spontaneously.    Compensatory Stepping Correction - Backward No step, OR would fall if not caught, OR falls spontaneously.    Compensatory Stepping Correction - Left Lateral Severe: Falls, or cannot step   No step, but hip strategy   Compensatory Stepping Correction - Right Lateral Severe:  Falls, or cannot step   No step, but hip strategy   Stepping Corredtion Lateral - lowest score 0    Stance - Feet together, eyes open, firm surface  Normal: 30s    Stance - Feet together, eyes closed, foam surface  Moderate: < 30s   5, 6.38 sec   Incline - Eyes Closed Moderate: Stands independently < 30s OR aligns with surface   5, 5.63 sec   Change in Gait Speed Moderate: Unable to change walking speed or signs of imbalance    Walk with head turns - Horizontal Moderate: performs head turns with reduction in gait speed.    Walk with pivot turns Moderate:Turns with feet close SLOW (>4 steps) with good balance.    Step over obstacles Moderate: Steps over box but touches box OR displays cautious behavior by slowing gait.    Timed UP & GO with Dual Task Moderate: Dual Task affects either counting OR walking (>10%) when compared to the TUG without Dual Task.   10.94 sec, 19.35 sec   Mini-BEST total score 12               PATIENT EDUCATION: Education details: Progress towards goals, POC; discussing *balance, freezing, bradykinesia/hypokinesia (unable to  sustain large amplitude movement patterns) at upcoming neurologist appointment (provided handouts from [facility]; fall risk per MiniBESTest; discussed POC and holding until after  MD visit, returning to re-address balance Person educated: Patient Education method: Explanation and Verbal cues Education comprehension: verbalized understanding   HOME EXERCISE PROGRAM:  Access Code: [email or web address] URL: [email or web address] Date: 12/28/2023; 3/10/20205 Prepared by: [facility] - Outpatient  Rehab - [facility]  *VERBALLY ADDED:  Walking 2 minutes in hallway or laps in home, 3x/day with focus on POSTURE, step length/foot clearance-01/12/2024 *Verbally added PWR! Up, PWR! [place], [place]! Step in standing, 10 reps, 1-2x/day  Exercises - Sit to Stand  - 1 x daily - 7 x weekly - 3 sets - 5 reps - Seated March  - 1 x daily - 7 x weekly - 3 sets - 10 reps - Seated Long Arc Quad  - 1 x daily - 7 x weekly - 3 sets - 10 reps - 3 sec hold - Seated Ankle Dorsiflexion AROM  - 1 x daily - 7 x weekly - 3 sets - 10 reps - 3 sec hold - Standing Gastroc Stretch at Counter  - 1 x daily - 7 x weekly - 1 sets - 3 reps - 30 sec hold - Side Stepping with Counter Support  - 1 x daily - 5 x weekly - 2 sets - 10 reps - Alternating Step Backward with Support  - 1 x daily - 5 x weekly - 2 sets - 10 reps - Alternating Step Taps with Counter Support  - 1 x daily - 5 x weekly - 2 sets - 10 reps      --------------------------------------------------------------------------------------------------------------- Note: Objective measures were completed at Evaluation unless otherwise noted.  DIAGNOSTIC FINDINGS: NA  COGNITION: Overall cognitive status: Within functional limits for tasks assessed   POSTURE: rounded shoulders, forward head, and bilat UT tremors  LOWER EXTREMITY ROM:     Active  Right Eval Left Eval  Hip flexion    Hip extension    Hip abduction    Hip adduction    Hip  internal rotation    Hip external rotation    Knee flexion    Knee extension -10 -12  Ankle dorsiflexion    Ankle plantarflexion    Ankle inversion    Ankle eversion     (Blank rows = not tested)  LOWER EXTREMITY MMT:    MMT Right Eval Left Eval  Hip flexion 4 4  Hip extension    Hip abduction 4 4  Hip adduction 4 4  Hip internal rotation    Hip external rotation    Knee flexion 4 4  Knee extension 4 4  Ankle dorsiflexion 3+ 3+  Ankle plantarflexion    Ankle inversion    Ankle eversion    (Blank rows = not tested)   TRANSFERS: Assistive device utilized: None  Sit to stand: SBA Stand to sit: SBA   GAIT: Gait pattern: step to pattern, step through pattern, decreased arm swing- Right, decreased arm swing- Left, decreased step length- Right, decreased step length- Left, shuffling, poor foot clearance- Right, and poor foot clearance- Left Distance walked: 50 ft x 2 Assistive device utilized: None Level of assistance: SBA Comments: increased BUE tremor, shuffling gait pattern, forward flexed posture  FUNCTIONAL TESTS:  5 times sit to stand: 20.22 sec Timed up and go (TUG): 29.72 sec 10 meter walk test: 19.97 sec = 1.64 ft/sec 360 turn:   R 12 steps   L 9 steps 43M backward walk:  19.93 sec TUG cognitive:30.89 sec TUG manual:  23.5 sec  TREATMENT DATE: 12/17/2023    PATIENT EDUCATION: Education details: Eval results, POC, HEP initiated Person educated: Patient Education method: Explanation, Demonstration, and Handouts Education comprehension: verbalized understanding  HOME EXERCISE PROGRAM: Access Code: [email or web address] URL: [email or web address] Date: 12/17/2023 Prepared by: [facility] - Outpatient  Rehab - [facility]  Exercises - Sit to Stand  - 1 x daily - 7 x weekly - 3 sets - 5 reps - Seated March  - 1 x daily - 7 x  weekly - 3 sets - 10 reps - Seated Long Arc Quad  - 1 x daily - 7 x weekly - 3 sets - 10 reps - 3 sec hold - Seated Ankle Dorsiflexion AROM  - 1 x daily - 7 x weekly - 3 sets - 10 reps - 3 sec hold  GOALS: Goals reviewed with patient? Yes  SHORT TERM GOALS: Target date: 01/15/2024  Pt will be independent with HEP for improved strength, balance, gait. Baseline: Goal status: MET per report 01/14/2024  2.  Pt will improve 5x sit<>stand to less than or equal to 15 sec to demonstrate improved functional strength and transfer efficiency. Baseline: 20.22 sec>16.22 sec 01/14/2024 Goal status:PARTIALLY MET 01/15/2024  3.  Pt will improve TUG score to less than or equal to 20 sec for decreased fall risk. Baseline: 29.72 sec>12.66 sec 01/14/2024 Goal status: MET, 01/14/2024  LONG TERM GOALS: Target date: 02/12/2024  Pt will be independent with progression of HEP for improved balance, gait, strength. Baseline: min cues, but pt reports doing 2x/day Goal status: MET, 02/09/2024  2.  Pt will improve gait velocity to at least 2.6 ft/sec for improved gait efficiency and safety. Baseline: 1.64 ft/sec>2.37 ft/sec 03/02/2024 Goal status: PARTIALLY MET, 02/11/2024  3.  Pt will improve TUG score to less than or equal to 15 sec for decreased fall risk. Baseline: 29.72 sec>10.94 sec 02/11/2024 Goal status: MET 02/11/2024  4.  Pt will perform 7M Walk test Backwards in < or equal to 10 seconds for improved balance in posterior direction. Baseline: 19 sec, 7.56 sec at best 02/11/2024 Goal status: MET, 02/11/2024  5.  Pt will verbalize plans for continued community fitness upon d/c from PT to maximize gains made in PT. Baseline: he reports he will not likely join classes; may use son's exercise equipment Goal status: MET, 02/09/2024  6.  Pt will report 0/10 dizziness with bed mobility.  Baseline:  0/10 dizziness since CRM  Goal status: 02/11/2024 MET ASSESSMENT:  CLINICAL IMPRESSION: Pt presents today with no new complaints.  Skilled PT session focused on assessing LTGs, with pt meeting 5 of 6 LTGs.  He has improved all functional measures, with gait velocity just not to goal level.  MiniBESTest performed today, and pt is at increased fall risk per score of 12/28.  He has difficulty with SLS, push and release tests, and compliant surfaces.  He continues to report that mobility and movement patterns trail off after some repetitions, making it harder to move.  He has upcoming neurologist appointment and PT encouraged pt to speak to neurologist about PD symptoms and his current medication routine.  Would like for pt to return in <1 month, after he sees MD, to work additionally on balance; pt is in agreement . Recert to be completed at that time.  OBJECTIVE IMPAIRMENTS: Abnormal gait, decreased balance, decreased mobility, difficulty walking, decreased strength, impaired flexibility, and postural dysfunction.   ACTIVITY LIMITATIONS: standing, transfers, and locomotion level  PARTICIPATION LIMITATIONS: shopping, community activity, and yard work  PERSONAL FACTORS: 3+ comorbidities: see PMH  are also affecting patient's functional outcome.   REHAB POTENTIAL: Good  CLINICAL DECISION MAKING: Evolving/moderate complexity  EVALUATION COMPLEXITY: Moderate  PLAN:  PT FREQUENCY: 2x/week  PT DURATION: 8 weeks plus eval visit   PLANNED INTERVENTIONS: 97110-Therapeutic exercises, 97530- Therapeutic activity, [id number]- Neuromuscular re-education, 97535- Self Care, 16109- Manual therapy, [phone]- Gait training, [phone]- Canalith repositioning, Patient/Family education, Balance training, Vestibular training, and DME instructions  PLAN FOR NEXT SESSION: Hold PT until after he sees Dr. Lucia Gaskins 4/15.  Return late April (ask about any medication changes).  Work on balance reactions, strategies, posture, SLS  Adalae Baysinger W., PT 02/11/2024, 12:35 PM  [facility] at [facility] [address] [place], [address] [place], [place] [address] Phone # [phone] Fax # [phone]

## 2024-02-16 ENCOUNTER — Ambulatory Visit: Admitting: Physical Therapy

## 2024-02-22 DIAGNOSIS — Z79899 Other long term (current) drug therapy: Secondary | ICD-10-CM | POA: Diagnosis not present

## 2024-02-22 DIAGNOSIS — Z5181 Encounter for therapeutic drug level monitoring: Secondary | ICD-10-CM | POA: Diagnosis not present

## 2024-02-23 ENCOUNTER — Ambulatory Visit: Payer: Medicare PPO | Admitting: Neurology

## 2024-02-23 ENCOUNTER — Encounter: Payer: Self-pay | Admitting: Neurology

## 2024-02-23 VITALS — BP 130/78 | HR 84 | Ht 67.0 in | Wt 167.0 lb

## 2024-02-23 DIAGNOSIS — G20A2 Parkinson's disease without dyskinesia, with fluctuations: Secondary | ICD-10-CM | POA: Diagnosis not present

## 2024-02-23 MED ORDER — CARBIDOPA-LEVODOPA 25-100 MG PO TABS: 1.0000 | ORAL_TABLET | Freq: Four times a day (QID) | ORAL | 4 refills | Status: AC

## 2024-02-23 NOTE — Patient Instructions (Addendum)
 Use walking aids Take 1 pill sinemet 8am, noon, 4pm, then 8pm, next increase to 1.5 pills   Fall Prevention in the Home, Adult Falls can cause injuries and affect people of all ages. There are many simple things that you can do to make your home safe and to help prevent falls. If you need it, ask for help making these changes. What actions can I take to prevent falls? General information Use good lighting in all rooms. Make sure to: Replace any light bulbs that burn out. Turn on lights if it is dark and use night-lights. Keep items that you use often in easy-to-reach places. Lower the shelves around your home if needed. Move furniture so that there are clear paths around it. Do not keep throw rugs or other things on the floor that can make you trip. If any of your floors are uneven, fix them. Add color or contrast paint or tape to clearly mark and help you see: Grab bars or handrails. First and last steps of staircases. Where the edge of each step is. If you use a ladder or stepladder: Make sure that it is fully opened. Do not climb a closed ladder. Make sure the sides of the ladder are locked in place. Have someone hold the ladder while you use it. Know where your pets are as you move through your home. What can I do in the bathroom?     Keep the floor dry. Clean up any water that is on the floor right away. Remove soap buildup in the bathtub or shower. Buildup makes bathtubs and showers slippery. Use non-skid mats or decals on the floor of the bathtub or shower. Attach bath mats securely with double-sided, non-slip rug tape. If you need to sit down while you are in the shower, use a non-slip stool. Install grab bars by the toilet and in the bathtub and shower. Do not use towel bars as grab bars. What can I do in the bedroom? Make sure that you have a light by your bed that is easy to reach. Do not use any sheets or blankets on your bed that hang to the floor. Have a firm bench  or chair with side arms that you can use for support when you get dressed. What can I do in the kitchen? Clean up any spills right away. If you need to reach something above you, use a sturdy step stool that has a grab bar. Keep electrical cables out of the way. Do not use floor polish or wax that makes floors slippery. What can I do with my stairs? Do not leave anything on the stairs. Make sure that you have a light switch at the top and the bottom of the stairs. Have them installed if you do not have them. Make sure that there are handrails on both sides of the stairs. Fix handrails that are broken or loose. Make sure that handrails are as long as the staircases. Install non-slip stair treads on all stairs in your home if they do not have carpet. Avoid having throw rugs at the top or bottom of stairs, or secure the rugs with carpet tape to prevent them from moving. Choose a carpet design that does not hide the edge of steps on the stairs. Make sure that carpet is firmly attached to the stairs. Fix any carpet that is loose or worn. What can I do on the outside of my home? Use bright outdoor lighting. Repair the edges of walkways and driveways  and fix any cracks. Clear paths of anything that can make you trip, such as tools or rocks. Add color or contrast paint or tape to clearly mark and help you see high doorway thresholds. Trim any bushes or trees on the main path into your home. Check that handrails are securely fastened and in good repair. Both sides of all steps should have handrails. Install guardrails along the edges of any raised decks or porches. Have leaves, snow, and ice cleared regularly. Use sand, salt, or ice melt on walkways during winter months if you live where there is ice and snow. In the garage, clean up any spills right away, including grease or oil spills. What other actions can I take? Review your medicines with your health care provider. Some medicines can make you  confused or feel dizzy. This can increase your chance of falling. Wear closed-toe shoes that fit well and support your feet. Wear shoes that have rubber soles and low heels. Use a cane, walker, scooter, or crutches that help you move around if needed. Talk with your provider about other ways that you can decrease your risk of falls. This may include seeing a physical therapist to learn to do exercises to improve movement and strength. Where to find more information Centers for Disease Control and Prevention, [employer]: TonerPromos.no [employer] on Aging: BaseRingTones.pl National Institute on Aging: BaseRingTones.pl Contact a health care provider if: You are afraid of falling at home. You feel weak, drowsy, or dizzy at home. You fall at home. Get help right away if you: Lose consciousness or have trouble moving after a fall. Have a fall that causes a head injury. These symptoms may be an emergency. Get help right away. Call 911. Do not wait to see if the symptoms will go away. Do not drive yourself to the hospital. This information is not intended to replace advice given to you by your health care provider. Make sure you discuss any questions you have with your health care provider. Document Revised: 06/30/2022 Document Reviewed: 06/30/2022 Elsevier Patient Education  2024 [employer].

## 2024-02-23 NOTE — Progress Notes (Signed)
 [facility]    Provider:  Dr Lucia Gaskins Requesting Provider: Sheran Luz, MD Primary Care Provider:  [name], DO  CC:  Parkinson's disease  02/23/2024: Haven't seen him in 2 years. He stopped the botox and felt his sialorrhea was worse. He reports imbalance, tremors, freezing. He is PT but not helping a lot. He has had falls as he is not using his walking aids. He takes sinemet 6 hours apart and it helps. He takes it at 8am, 2pm and 8pm and goes to sleep at 11-midnight. He wears off sooner than 6 hours. Today he has not taken his 2pm dose. It goes away /significantly improves. + fluctuations. + freezing. No dyskinesias. No swallowing problems. Hypomimia, resting tremor, shuffling, right > left cogwheeling, pupils pin point. We discussed fall risks, getting skin checks, memory loss, dyskinesias, watching for swallowing problems, sialorrhea, constipation. Here with wife who also provides information.  Patient complains of symptoms per HPI as well as the following symptoms: sialorrhea . Pertinent negatives and positives per HPI. All others negative   11/20/2021: Stable. No swallowing problems. He drools a little bit, not really a problem, no falls, walking is a little worse. He gets dizzy when he gets up he gets dizzy, doesn't really drink a lot of fluids. He could start with hydrating better, usually dark, discussed, slowly get up in the morning, not as good as it used to be. Memory is not as good, decining as far as memory goes, discussed neuropsych testing and they declined, start a little aricept. He has tremors come and go, wife provides information and stable. He exercises with a stionary bike and uses it.   05/21/2021: He started 2 Sinemet IR a day and didn't feel that helped at all. Then went to 3 but that made him dizzy. Tried it every 6 hours. He wanted to try it and see if it would help a lot and didn't really help. And sometimes he does not shake at all. At this time  will not take any medications. The shaking gets worse when he is tired or in certain situations, so could take 1/2 a pill or a whole pill situationally and see if that helps.He lost his balance ponce and fell. He declines PT, he declines classes we provided. No problems swallowing. He feels stable and his wife agrees. When he walks a little bit more shuffling. Bascially stable, no coughing or problems with food, appetite is good.   Interval history 11/29/2020: Decreased radiotracer activity within the posterior aspect of the LEFT and RIGHT putamen while not definitive is a pattern suggestive of Parkinson's syndrome pathology. We discussed parkinson's disease in detail today.   DAT Scan: 11/06/2020:   MRI brain 10/09/2021: MRI brain (with and without) demonstrating: - Mild chronic small vessel ischemic disease. - No acute findings.   MRI c-spine 10/09/2020: MRI cervical spine (without) demonstrating: - At C3-4: disc bulging and facet hypertrophy with severe biforaminal stenosis. - At C4-5: right disc bulging and facet hypertrophy with severe right and moderate left foraminal stenosis.    HPI:  ELAZAR ARGABRIGHT is a 83 y.o. male here as requested by Dr. Ethelene Hal, MD for abnormality of gait and mobility. PMHx prostate cancer, HTN, HLD, diabetes.  I reviewed Dr. Derrel Nip notes: He has been seeing Dr. Horald Chestnut for bilateral L5 selective nerve root block (SNR B) which was some help, he still complaining of bilateral thigh pain, in the morning but his back is feeling much better.  He uses tramadol  and Advil as needed, he feels his balance is off, his right lower leg is tight, but he does feel as though he is doing better, patient has central canal spinal stenosis, bilateral leg pain, severe spinal stenosis L4-L5 and L3-L4, previously evaluated by Dr. Vaughn Georges.Dr. Vaughn Georges potentially recommended decompression of his lumbar/lumbosacral spine if symptoms do not get better.    Here with his wife who provides much  information. slides his feet. He doesn't pick his feet up. He has had tremors for 2 years. Tremors mostly when he is doing something. But he can have tremors at baseline or at rest. Not getting worse, wife provides most information today. Sometimes he feels off balance especially if turn quickly. He has fallen if he bend over. No FHx of tremors. He doesn't seem as with it but he does have hearing aids now. Feels his expressions has become less so. His voice is softer. Labile blood pressure. Not fitful in bed. He snores but not every night. Doesn't move at night. Difficulty with controlling saliva, Not tired during, not taking naps. He feels refreshed int he morning. A lot slower, reactions are slower. No FHx of Parkinson's disease. Not noticed taste or smell problems. Handwriting has always been messy. No difficulty swallowing. Not repeating things, memory not as good Mo hallucinations.Tremor started in the right hand and now is in the left hand.   Reviewed notes, labs and imaging from outside physicians, which showed: see above  Review of Systems: Patient complains of symptoms per HPI as well as the following symptoms: imbalance . Pertinent negatives and positives per HPI. All others negative     Social History   Socioeconomic History   Marital status: Married    Spouse name: Not on file   Number of children: 2   Years of education: Not on file   Highest education level: Some college, no degree  Occupational History   Occupation: retired  Tobacco Use   Smoking status: Former    Current packs/day: 0.00    Types: Cigarettes    Quit date: 2016    Years since quitting: 9.2   Smokeless tobacco: Never  Vaping Use   Vaping status: Never Used  Substance and Sexual Activity   Alcohol use: Never   Drug use: Never   Sexual activity: Not Currently    Comment: 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 Medications have been unsuccessful.  Other Topics Concern   Not on file  Social History Narrative   Resides in  [place] with wife. Has two grown sons and three grandchildren.       Lives at home with wife   Right handed   Caffeine: 2 cups coffee/day   Social Drivers of Corporate investment banker Strain: Not on file  Food Insecurity: No Food Insecurity (09/11/2023)   Hunger Vital Sign    Worried About Running Out of Food in the Last Year: Never true    Ran Out of Food in the Last Year: Never true  Transportation Needs: No Transportation Needs (09/11/2023)   PRAPARE - Administrator, Civil Service (Medical): No    Lack of Transportation (Non-Medical): No  Physical Activity: Not on file  Stress: Not on file  Social Connections: Not on file  Intimate Partner Violence: Not At Risk (09/11/2023)   Humiliation, Afraid, Rape, and Kick questionnaire    Fear of Current or Ex-Partner: No    Emotionally Abused: No    Physically Abused: No    Sexually Abused: No  Family History  Problem Relation Age of Onset   Breast cancer Mother    Heart attack Father    Prostate cancer Neg Hx    Pancreatic cancer Neg Hx    Colon cancer Neg Hx    Tremor Neg Hx    Parkinson's disease Neg Hx     Past Medical History:  Diagnosis Date   Carpal tunnel syndrome    bilateral, had shots in each one and "haven't had any problems since".   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;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   Parkinson's disease (HCC) 12/02/2020   Malignant neoplasm of prostate (HCC) 05/05/2018   Hypertension 06/10/2011   Hyperlipidemia 06/10/2011    Diabetes mellitus (HCC) 06/10/2011   Onychomycosis 06/10/2011   Paronychia 06/10/2011    Past Surgical History:  Procedure Laterality Date   HERNIA REPAIR     HERNIA REPAIR     INGUINAL HERNIA REPAIR     KNEE SURGERY  right   KNEE SURGERY Left    PROSTATE BIOPSY     RADIOACTIVE SEED IMPLANT N/A 09/07/2018   Procedure: RADIOACTIVE SEED IMPLANT/BRACHYTHERAPY IMPLANT;  Surgeon: Jerilee Field, MD;  Location: [facility];  Service: Urology;  Laterality: N/A;   SPACE OAR INSTILLATION N/A 09/07/2018   Procedure: SPACE OAR INSTILLATION;  Surgeon: Jerilee Field, MD;  Location[facility];  Service: Urology;  Laterality: N/A;    Current Outpatient Medications  Medication Sig Dispense Refill   alfuzosin (UROXATRAL) 10 MG 24 hr tablet Take 10 mg by mouth daily with breakfast.     atorvastatin (LIPITOR) 10 MG tablet Take 10 mg by mouth at bedtime.     carvedilol (COREG) 3.125 MG tablet Take 3.125 mg by mouth 2 (two) times daily with a meal.     Cyanocobalamin (VITAMIN B-12 PO) Take 2,500 mcg by mouth daily.     donepezil (ARICEPT) 5 MG tablet TAKE 1 TABLET BY MOUTH EVERYDAY AT BEDTIME 90 tablet 2   HYDROcodone-acetaminophen (NORCO/VICODIN) 5-325 MG tablet Take 1 tablet by mouth every 6 (six) hours as needed (for pain). 15 tablet 0   Multiple Vitamins-Minerals (ONE-A-DAY 50 PLUS PO) Take 1 tablet by mouth daily with breakfast.     ONETOUCH DELICA LANCETS FINE MISC      pantoprazole (PROTONIX) 40 MG tablet Take 1 tablet (40 mg total) by mouth daily. 30 tablet 0   TYLENOL 500 MG tablet Take 500 mg by mouth 2 (two) times daily as needed for mild pain (pain score 1-3) or headache.     carbidopa-levodopa (SINEMET IR) 25-100 MG tablet Take 1 tablet by mouth 4 (four) times daily. For example, Take 1 pill sinemet 8am, noon, 4pm, then 8pm, do not take with protein 360 tablet 4   No current facility-administered medications for this visit.    Allergies as of  02/23/2024 - Review Complete 02/23/2024  Allergen Reaction Noted   Sulfa antibiotics Swelling and Rash 06/10/2011    Vitals: BP 130/78 (Cuff Size: Normal)  Pulse 84   Ht 5\' 7"  (1.702 m)   Wt 167 lb (75.8 kg)   BMI 26.16 kg/m  Last Weight:  Wt Readings from Last 1 Encounters:  02/23/24 167 lb (75.8 kg)   Last Height:   Ht Readings from Last 1 Encounters:  02/23/24 5\' 7"  (1.702 m)    Exam: NAD, pleasant                  Speech:    Speech is fluent Cognition:    The patient is oriented to person, place, his condition    Cranial Nerves: hypomimia    The pupils are pinpoint.Trigeminal sensation is intact and the muscles of mastication are normal. The face is symmetric. The palate elevates in the midline. Hearing intact. hypophonia. Shoulder shrug is normal. The tongue has normal motion without fasciculations.   Coordination:  No dysmetria  Motor Observation: Resting tremor Tone: Increased tone right > left/cogwheeling   Strength:    Strength is V/V in the upper and lower limbs.      Sensation: intact to LT  Gait: shuffling, erect posture, decreased arm swing with re-emergent tremor   DTR's: Absent AJs otherwise deep tendon reflexes in the upper and lower extremities are brisk bilaterally.   Toes:    The toes are downgoing bilaterally.   Clonus:    Clonus is absent.  Coordination: bradylnesia  Assessment/Plan:  [age] year old here with his wife. 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 Progressed per wife and patient  Use walking aids, fall risks Take 1 pill sinemet 8am, noon, 4pm, then 8pm, next increase to 1.5 pills 4x a day as needed Follow up 6 months    Meds ordered this encounter  Medications   carbidopa-levodopa (SINEMET IR) 25-100 MG tablet    Sig: Take 1 tablet by mouth 4 (four) times daily. For example, Take 1 pill sinemet 8am, noon, 4pm, then 8pm, do not take with protein    Dispense:  360 tablet     Refill:  4     Cc: Johnie Nailer, DO  Aldona Amel, MD  [facility] [address] [place], [place] [address]  Phone [phone] Fax [phone]  I spent over 30 minutes of face-to-face and non-face-to-face time with patient on the  1. Parkinson's disease without dyskinesia, with fluctuating manifestations (HCC)    diagnosis.  This included previsit chart review, lab review, study review, order entry, electronic health record documentation, patient education on the different diagnostic and therapeutic options, counseling and coordination of care, risks and benefits of management, compliance, or risk factor reduction

## 2024-03-02 ENCOUNTER — Ambulatory Visit: Payer: Medicare PPO | Admitting: Physical Therapy

## 2024-03-05 DIAGNOSIS — A419 Sepsis, unspecified organism: Secondary | ICD-10-CM | POA: Diagnosis not present

## 2024-03-05 DIAGNOSIS — R1310 Dysphagia, unspecified: Secondary | ICD-10-CM | POA: Diagnosis not present

## 2024-03-05 DIAGNOSIS — E222 Syndrome of inappropriate secretion of antidiuretic hormone: Secondary | ICD-10-CM | POA: Diagnosis not present

## 2024-03-05 DIAGNOSIS — R278 Other lack of coordination: Secondary | ICD-10-CM | POA: Diagnosis not present

## 2024-03-05 DIAGNOSIS — M6281 Muscle weakness (generalized): Secondary | ICD-10-CM | POA: Diagnosis not present

## 2024-03-05 DIAGNOSIS — N179 Acute kidney failure, unspecified: Secondary | ICD-10-CM | POA: Diagnosis not present

## 2024-03-05 DIAGNOSIS — G20C Parkinsonism, unspecified: Secondary | ICD-10-CM | POA: Diagnosis not present

## 2024-03-08 ENCOUNTER — Encounter: Payer: Self-pay | Admitting: Physical Therapy

## 2024-03-08 ENCOUNTER — Ambulatory Visit: Admitting: Physical Therapy

## 2024-03-08 DIAGNOSIS — R29818 Other symptoms and signs involving the nervous system: Secondary | ICD-10-CM | POA: Diagnosis not present

## 2024-03-08 DIAGNOSIS — R2681 Unsteadiness on feet: Secondary | ICD-10-CM

## 2024-03-08 DIAGNOSIS — M6281 Muscle weakness (generalized): Secondary | ICD-10-CM

## 2024-03-08 DIAGNOSIS — R2689 Other abnormalities of gait and mobility: Secondary | ICD-10-CM | POA: Diagnosis not present

## 2024-03-08 NOTE — Therapy (Signed)
 OUTPATIENT PHYSICAL THERAPY NEURO TREATMENT/RECERT   Patient Name: Scott Wang MRN: [id number] DOB:[birth year], 83 y.o., male Today's Date: 03/08/2024   PCP: Windell Hasty, DO  REFERRING PROVIDER: Adelaide Adjutant, MD      END OF SESSION:  PT End of Session - 03/08/24 1019     Visit Number 16    Number of Visits 23    Date for PT Re-Evaluation 04/01/24    Authorization Type [employer] Medicare-auth requested at recert 03/08/2024    Authorization - Visit Number 16    Authorization - Number of Visits --    PT Start Time 1018    PT Stop Time 1058    PT Time Calculation (min) 40 min    Equipment Utilized During Treatment Gait belt    Activity Tolerance Patient tolerated treatment well    Behavior During Therapy WFL for tasks assessed/performed                        Past Medical History:  Diagnosis Date   Carpal tunnel syndrome    bilateral, had shots in each one and "haven't had any problems since".   Diabetes mellitus    Hyperlipidemia    Hypertension    Parkinson's disease (HCC) 2021   Prostate cancer (HCC)    Spinal stenosis    Past Surgical History:  Procedure Laterality Date   HERNIA REPAIR     HERNIA REPAIR     INGUINAL HERNIA REPAIR     KNEE SURGERY  right   KNEE SURGERY Left    PROSTATE BIOPSY     RADIOACTIVE SEED IMPLANT N/A 09/07/2018   Procedure: RADIOACTIVE SEED IMPLANT/BRACHYTHERAPY IMPLANT;  Surgeon: Christina Coyer, MD;  [facility];  Service: Urology;  Laterality: N/A;   SPACE OAR INSTILLATION N/A 09/07/2018   Procedure: SPACE OAR INSTILLATION;  Surgeon: Christina Coyer, MD;  Location: [facility];  Service: Urology;  Laterality: N/A;   Patient Active Problem List   Diagnosis Date Noted   AKI (acute kidney injury) (HCC) 09/10/2023   Orthostatic hypotension 11/20/2021   not drinking enough fluids 11/20/2021   Imbalance 11/20/2021   Memory loss 11/20/2021   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  11/20/2021   Muscle stiffness due to parkinson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;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   Parkinson's disease (HCC) 12/02/2020   Malignant neoplasm of prostate (HCC) 05/05/2018   Hypertension 06/10/2011   Hyperlipidemia 06/10/2011   Diabetes mellitus (HCC) 06/10/2011   Onychomycosis 06/10/2011   Paronychia 06/10/2011    ONSET DATE: 12/11/2023  REFERRING DIAG: R26.9 (ICD-10-CM) - Abnormality of gait and mobility   THERAPY DIAG:  Unsteadiness on feet  Other abnormalities of gait and mobility  Muscle weakness (generalized)  Other symptoms and signs involving the nervous system  Rationale for Evaluation and Treatment: Rehabilitation  SUBJECTIVE:  SUBJECTIVE STATEMENT: Saw the doctor and she added another pill during the day.  Not sure I notice a difference.  No more dizziness.  Did have a fall-caught a brick and fell.  Son had to get me up.  Pt accompanied by: significant other, wife in lobby  PERTINENT HISTORY: BLE pain, severe spinal stenosis L4-5, L3-4, Parkinson's, R hip pain, BLE lymphedema  PAIN:  Are you having pain? No  PRECAUTIONS: Fall  RED FLAGS: None   WEIGHT BEARING RESTRICTIONS: No  FALLS: Has patient fallen in last 6 months? Yes. Number of falls 3  LIVING ENVIRONMENT: Lives with: lives with their spouse Lives in: House/apartment Stairs: Yes: External: 2-3 steps; can reach both Has following equipment at home: Single point cane, Walker - 2 wheeled, and Wheelchair (manual)  PLOF: Independent  PATIENT GOALS: Pt wants to get back to normal.  OBJECTIVE:    TODAY'S TREATMENT: 03/07/2024 Activity Comments  10 M = 11.9 sec = 2.76 ft/sec   3 M walk back:  7.37 sec with hastening   TUG 12.75 sec   FTSTS:  16.19 sec   Review of  HEP: Gastroc stretch, 2 x 30 sec Sidestep and weightshift x 10 Back step and weightshift x 10 Alt step tapping x 10   Cues for foot clearace   Stagger stance forward/back rocking, 15 reps   Forward/back walking at counter Hastening in backward direction  Forward/back stepping, 3 x 5 reps UE support    M-CTSIB  Condition 1: Firm Surface, EO 30 Sec, Normal Sway  Condition 2: Firm Surface, EC 30 Sec, Mild Sway  Condition 3: Foam Surface, EO 10.07 Sec, Severe Sway with posterior LOB (step strategy)  Condition 4: Foam Surface, EC 4.47 Sec, Severe Sway with posterior LOB        PATIENT EDUCATION: Education details: Discussed POC, continued balance deficits and fall risk.  Discussed recent MD visit and encouraged pt to continue to follow new medication plan for his PD meds for improved symptom management Person educated: Patient Education method: Explanation and Verbal cues Education comprehension: verbalized understanding   HOME EXERCISE PROGRAM:  Access Code: [email or web address] URL: [email or web address] Date: 12/28/2023; 3/10/20205, 03/08/2024 Prepared by: [facility] - Outpatient  Rehab - [facility]  *VERBALLY ADDED:  Walking 2 minutes in hallway or laps in home, 3x/day with focus on POSTURE, step length/foot clearance-01/12/2024 *Verbally added PWR! Up, PWR! [place], [place]! Step in standing, 10 reps, 1-2x/day  Exercises - Sit to Stand  - 1 x daily - 7 x weekly - 3 sets - 5 reps - Seated March  - 1 x daily - 7 x weekly - 3 sets - 10 reps - Seated Long Arc Quad  - 1 x daily - 7 x weekly - 3 sets - 10 reps - 3 sec hold - Seated Ankle Dorsiflexion AROM  - 1 x daily - 7 x weekly - 3 sets - 10 reps - 3 sec hold - Standing Gastroc Stretch at Counter  - 1 x daily - 7 x weekly - 1 sets - 3 reps - 30 sec hold - Side Stepping with Counter Support  - 1 x daily - 5 x weekly - 2 sets - 10 reps - Alternating Step Backward with Support  - 1 x daily - 5 x weekly - 2 sets - 10 reps  *Also  do forward/back stepping, 10 in a row, UE support at counter - Alternating Step Taps with Counter Support  - 1 x daily - 5 x  weekly - 2 sets - 10 reps      --------------------------------------------------------------------------------------------------------------- Note: Objective measures were completed at Evaluation unless otherwise noted.  DIAGNOSTIC FINDINGS: NA  COGNITION: Overall cognitive status: Within functional limits for tasks assessed   POSTURE: rounded shoulders, forward head, and bilat UT tremors  LOWER EXTREMITY ROM:     Active  Right Eval Left Eval  Hip flexion    Hip extension    Hip abduction    Hip adduction    Hip internal rotation    Hip external rotation    Knee flexion    Knee extension -10 -12  Ankle dorsiflexion    Ankle plantarflexion    Ankle inversion    Ankle eversion     (Blank rows = not tested)  LOWER EXTREMITY MMT:    MMT Right Eval Left Eval  Hip flexion 4 4  Hip extension    Hip abduction 4 4  Hip adduction 4 4  Hip internal rotation    Hip external rotation    Knee flexion 4 4  Knee extension 4 4  Ankle dorsiflexion 3+ 3+  Ankle plantarflexion    Ankle inversion    Ankle eversion    (Blank rows = not tested)   TRANSFERS: Assistive device utilized: None  Sit to stand: SBA Stand to sit: SBA   GAIT: Gait pattern: step to pattern, step through pattern, decreased arm swing- Right, decreased arm swing- Left, decreased step length- Right, decreased step length- Left, shuffling, poor foot clearance- Right, and poor foot clearance- Left Distance walked: 50 ft x 2 Assistive device utilized: None Level of assistance: SBA Comments: increased BUE tremor, shuffling gait pattern, forward flexed posture  FUNCTIONAL TESTS:  5 times sit to stand: 20.22 sec Timed up and go (TUG): 29.72 sec 10 meter walk test: 19.97 sec = 1.64 ft/sec 360 turn:   R 12 steps   L 9 steps 61M backward walk:  19.93 sec TUG cognitive:30.89 sec TUG  manual:  23.5 sec                                                                                                                               TREATMENT DATE: 12/17/2023    PATIENT EDUCATION: Education details: Eval results, POC, HEP initiated Person educated: Patient Education method: Explanation, Demonstration, and Handouts Education comprehension: verbalized understanding  HOME EXERCISE PROGRAM: Access Code: [email or web address] URL: [email or web address] Date: 12/17/2023 Prepared by: [facility] - Outpatient  Rehab - [facility]  Exercises - Sit to Stand  - 1 x daily - 7 x weekly - 3 sets - 5 reps - Seated March  - 1 x daily - 7 x weekly - 3 sets - 10 reps - Seated Long Arc Quad  - 1 x daily - 7 x weekly - 3 sets - 10 reps - 3 sec hold - Seated Ankle Dorsiflexion AROM  - 1 x daily - 7 x weekly - 3 sets -  10 reps - 3 sec hold  GOALS: Goals reviewed with patient? Yes   LONG TERM GOALS: Target date: 04/01/2024  Pt will be independent with advanced HEP for improved strength, balance, transfers, and gait. Baseline:  Goal status: INITIAL  2.  Pt will improve 5x sit<>stand to less than or equal to 15 sec to demonstrate improved functional strength and transfer efficiency. Baseline: 16.19 sec Goal status: INITIAL  3.  Pt will improve MiniBESTest score to at least 18/28 to decrease fall risk. Baseline: 12/28 Goal status: INITIAL  4.  Pt will improve 3 M walk backwards to <7 seconds with NO HASTENING, for improved gait efficiency and safety. Baseline: 7.37 sec with hastening Goal status: INITIAL  5.  Pt will perform floor>stand transfer, mod I, for improved fall recovery.  Baseline:  Needs son's assist  Goal status:  INITIAL   ASSESSMENT:  CLINICAL IMPRESSION: Pt presents today having seen neurologist earlier in April.  She made adjustments to pt's medication routine, so that he is taking an additional pill each day.  Recert completed today, with pt continueing to  be at increased fall risk per FTSTS and MiniBESTest scores; with 62M walk back and some gait forward, he demo hastening of gait.  He reports recent fall due to L foot catching on brick outside.  Addressed balance in posterior direction and added to HEP. Pt will continue to benefit from skilled PT towards goals for improved functional mobility and decreased fall risk.   OBJECTIVE IMPAIRMENTS: Abnormal gait, decreased balance, decreased mobility, difficulty walking, decreased strength, impaired flexibility, and postural dysfunction.   ACTIVITY LIMITATIONS: standing, transfers, and locomotion level  PARTICIPATION LIMITATIONS: shopping, community activity, and yard work  PERSONAL FACTORS: 3+ comorbidities: see PMH  are also affecting patient's functional outcome.   REHAB POTENTIAL: Good  CLINICAL DECISION MAKING: Evolving/moderate complexity  EVALUATION COMPLEXITY: Moderate  PLAN:  PT FREQUENCY: 2x/week  PT DURATION: 4 weeks including 03/08/2024 visit  PLANNED INTERVENTIONS: 97110-Therapeutic exercises, 97530- Therapeutic activity, 97112- Neuromuscular re-education, 97535- Self Care, 16109- Manual therapy, [phone]- Gait training, [phone]- Canalith repositioning, Patient/Family education, Balance training, Vestibular training, and DME instructions  PLAN FOR NEXT SESSION: Review HEP;   Work on balance reactions in posterior direction, strategies, posture, SLS.  Simulate outdoor surfaces or walk outdoors   Jimya Ciani W., PT 03/08/2024, 11:56 AM  [facility] Re[facility] at [facility] [address] [place], [address] [place], [place] [address] Phone # [phone] Fax # [phone]  Referring diagnosis? R26.9 (ICD-10-CM) - Abnormality of gait and mobility  Treatment diagnosis? (if different than referring diagnosis) R26.81, R26.89, M62.81, R29.818 What was this (referring dx) caused by? []  Surgery [x]  Fall [x]  Ongoing issue []  Arthritis []  Other:  ____________  Laterality: []  Rt [x]  Lt [x]  Both  Check all possible CPT codes:  *CHOOSE 10 OR LESS*    See Planned Interventions listed in the Plan section of the Evaluation.

## 2024-03-09 DIAGNOSIS — D4101 Neoplasm of uncertain behavior of right kidney: Secondary | ICD-10-CM | POA: Diagnosis not present

## 2024-03-10 ENCOUNTER — Encounter: Payer: Self-pay | Admitting: Physical Therapy

## 2024-03-10 ENCOUNTER — Ambulatory Visit: Attending: Physical Medicine and Rehabilitation | Admitting: Physical Therapy

## 2024-03-10 DIAGNOSIS — R2689 Other abnormalities of gait and mobility: Secondary | ICD-10-CM | POA: Diagnosis not present

## 2024-03-10 DIAGNOSIS — R2681 Unsteadiness on feet: Secondary | ICD-10-CM | POA: Insufficient documentation

## 2024-03-10 DIAGNOSIS — R29818 Other symptoms and signs involving the nervous system: Secondary | ICD-10-CM | POA: Diagnosis not present

## 2024-03-10 DIAGNOSIS — M6281 Muscle weakness (generalized): Secondary | ICD-10-CM | POA: Diagnosis not present

## 2024-03-10 NOTE — Therapy (Signed)
 OUTPATIENT PHYSICAL THERAPY NEURO TREATMENT NOTE   Patient Name: Scott Wang MRN: [id number] DOB:[birth year], 83 y.o., male Today's Date: 03/10/2024   PCP: Windell Hasty, DO  REFERRING PROVIDER: Adelaide Adjutant, MD      END OF SESSION:  PT End of Session - 03/10/24 1020     Visit Number 17    Number of Visits 23    Date for PT Re-Evaluation 04/01/24    Authorization Type [employer]    Authorization Time Period 03/08/2024-04/01/2024    Authorization - Visit Number 1    Authorization - Number of Visits 8    Progress Note Due on Visit 20    PT Start Time 1018    PT Stop Time 1059    PT Time Calculation (min) 41 min    Equipment Utilized During Treatment Gait belt    Activity Tolerance Patient tolerated treatment well    Behavior During Therapy WFL for tasks assessed/performed                         Past Medical History:  Diagnosis Date   Carpal tunnel syndrome    bilateral, had shots in each one and "haven't had any problems since".   Diabetes mellitus    Hyperlipidemia    Hypertension    Parkinson's disease (HCC) 2021   Prostate cancer (HCC)    Spinal stenosis    Past Surgical History:  Procedure Laterality Date   HERNIA REPAIR     HERNIA REPAIR     INGUINAL HERNIA REPAIR     KNEE SURGERY  right   KNEE SURGERY Left    PROSTATE BIOPSY     RADIOACTIVE SEED IMPLANT N/A 09/07/2018   Procedure: RADIOACTIVE SEED IMPLANT/BRACHYTHERAPY IMPLANT;  Surgeon: Christina Coyer, MD;  Location: [facility];  Service: Urology;  Laterality: N/A;   SPACE OAR INSTILLATION N/A 09/07/2018   Procedure: SPACE OAR INSTILLATION;  Surgeon: Christina Coyer, MD;  Location: [facility];  Service: Urology;  Laterality: N/A;   Patient Active Problem List   Diagnosis Date Noted   AKI (acute kidney injury) (HCC) 09/10/2023   Orthostatic hypotension 11/20/2021   not drinking enough fluids 11/20/2021   Imbalance 11/20/2021   Memory loss  11/20/2021   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   Muscle stiffness due to parkinson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;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   Parkinson's disease (HCC) 12/02/2020   Malignant neoplasm of prostate (HCC) 05/05/2018   Hypertension 06/10/2011   Hyperlipidemia 06/10/2011   Diabetes mellitus (HCC) 06/10/2011   Onychomycosis 06/10/2011   Paronychia 06/10/2011    ONSET DATE: 12/11/2023  REFERRING DIAG: R26.9 (ICD-10-CM) - Abnormality of gait and mobility   THERAPY DIAG:  Unsteadiness on feet  Other abnormalities of gait and mobility  Muscle weakness (generalized)  Other symptoms and signs involving the nervous system  Rationale for Evaluation and Treatment: Rehabilitation  SUBJECTIVE:  SUBJECTIVE STATEMENT: Had a fall yesterday raking in the yard, didn't hurt myself and was able to get up on my own.  Pt accompanied by: significant other, wife in lobby  PERTINENT HISTORY: BLE pain, severe spinal stenosis L4-5, L3-4, Parkinson's, R hip pain, BLE lymphedema  PAIN:  Are you having pain? No  PRECAUTIONS: Fall  RED FLAGS: None   WEIGHT BEARING RESTRICTIONS: No  FALLS: Has patient fallen in last 6 months? Yes. Number of falls 3  LIVING ENVIRONMENT: Lives with: lives with their spouse Lives in: House/apartment Stairs: Yes: External: 2-3 steps; can reach both Has following equipment at home: Single point cane, Walker - 2 wheeled, and Wheelchair (manual)  PLOF: Independent  PATIENT GOALS: Pt wants to get back to normal.  OBJECTIVE:    TODAY'S TREATMENT: 03/10/2024 Activity Comments  Sit to stand, 3 sets x 5 reps-cues for increased forward lean/hinge at hips  Holding red therapy ball, bouncing for generating power  Sit to stand with resistance from  posterior direction, 2 x 5 reps Blue theraband, to increase forward lean/hinge at hips  Resisted forward walking/back walking at weightrack 10#, min/mod assist and cues for position  Facing counter:  back step and weigthshift 2 x 10 reps BUE support to coordinated forward UE motion, cues for quick, large motions and return to midline for balance recovery  Forward/back step and weightshift-review of HEP addition Cues for large steps and hinge at hips  Four square step activity , min guard Cues for large step, wide BOS to recover       PATIENT EDUCATION: Education details: Continue current HEP; discussed/educated in mechanism of posterior balance recovery-"hinge at hips", forward step to recovery from hastening in posterior direction, wide BOS for optimal weightshifting Person educated: Patient Education method: Explanation and Verbal cues Education comprehension: verbalized understanding   HOME EXERCISE PROGRAM:  Access Code: [email or web address] URL: [email or web address] Date: 12/28/2023; 3/10/20205, 03/08/2024 Prepared by: [facility] - Outpatient  Rehab - [facility]  *VERBALLY ADDED:  Walking 2 minutes in hallway or laps in home, 3x/day with focus on POSTURE, step length/foot clearance-01/12/2024 *Verbally added PWR! Up, PWR! [place], [place]! Step in standing, 10 reps, 1-2x/day  Exercises - Sit to Stand  - 1 x daily - 7 x weekly - 3 sets - 5 reps - Seated March  - 1 x daily - 7 x weekly - 3 sets - 10 reps - Seated Long Arc Quad  - 1 x daily - 7 x weekly - 3 sets - 10 reps - 3 sec hold - Seated Ankle Dorsiflexion AROM  - 1 x daily - 7 x weekly - 3 sets - 10 reps - 3 sec hold - Standing Gastroc Stretch at Counter  - 1 x daily - 7 x weekly - 1 sets - 3 reps - 30 sec hold - Side Stepping with Counter Support  - 1 x daily - 5 x weekly - 2 sets - 10 reps - Alternating Step Backward with Support  - 1 x daily - 5 x weekly - 2 sets - 10 reps  *Also do forward/back stepping, 10 in a row, UE  support at counter - Alternating Step Taps with Counter Support  - 1 x daily - 5 x weekly - 2 sets - 10 reps      --------------------------------------------------------------------------------------------------------------- Note: Objective measures were completed at Evaluation unless otherwise noted.  DIAGNOSTIC FINDINGS: NA  COGNITION: Overall cognitive status: Within functional limits for tasks assessed   POSTURE: rounded shoulders, forward head,  and bilat UT tremors  LOWER EXTREMITY ROM:     Active  Right Eval Left Eval  Hip flexion    Hip extension    Hip abduction    Hip adduction    Hip internal rotation    Hip external rotation    Knee flexion    Knee extension -10 -12  Ankle dorsiflexion    Ankle plantarflexion    Ankle inversion    Ankle eversion     (Blank rows = not tested)  LOWER EXTREMITY MMT:    MMT Right Eval Left Eval  Hip flexion 4 4  Hip extension    Hip abduction 4 4  Hip adduction 4 4  Hip internal rotation    Hip external rotation    Knee flexion 4 4  Knee extension 4 4  Ankle dorsiflexion 3+ 3+  Ankle plantarflexion    Ankle inversion    Ankle eversion    (Blank rows = not tested)   TRANSFERS: Assistive device utilized: None  Sit to stand: SBA Stand to sit: SBA   GAIT: Gait pattern: step to pattern, step through pattern, decreased arm swing- Right, decreased arm swing- Left, decreased step length- Right, decreased step length- Left, shuffling, poor foot clearance- Right, and poor foot clearance- Left Distance walked: 50 ft x 2 Assistive device utilized: None Level of assistance: SBA Comments: increased BUE tremor, shuffling gait pattern, forward flexed posture  FUNCTIONAL TESTS:  5 times sit to stand: 20.22 sec Timed up and go (TUG): 29.72 sec 10 meter walk test: 19.97 sec = 1.64 ft/sec 360 turn:   R 12 steps   L 9 steps 61M backward walk:  19.93 sec TUG cognitive:30.89 sec TUG manual:  23.5 sec                                                                                                                                TREATMENT DATE: 12/17/2023    PATIENT EDUCATION: Education details: Eval results, POC, HEP initiated Person educated: Patient Education method: Explanation, Demonstration, and Handouts Education comprehension: verbalized understanding  HOME EXERCISE PROGRAM: Access Code: [email or web address] URL: [email or web address] Date: 12/17/2023 Prepared by: [facility] - Outpatient  Rehab - [facility]  Exercises - Sit to Stand  - 1 x daily - 7 x weekly - 3 sets - 5 reps - Seated March  - 1 x daily - 7 x weekly - 3 sets - 10 reps - Seated Long Arc Quad  - 1 x daily - 7 x weekly - 3 sets - 10 reps - 3 sec hold - Seated Ankle Dorsiflexion AROM  - 1 x daily - 7 x weekly - 3 sets - 10 reps - 3 sec hold  GOALS: Goals reviewed with patient? Yes   LONG TERM GOALS: Target date: 04/01/2024  Pt will be independent with advanced HEP for improved strength, balance, transfers, and gait. Baseline:  Goal status: INITIAL  2.  Pt  will improve 5x sit<>stand to less than or equal to 15 sec to demonstrate improved functional strength and transfer efficiency. Baseline: 16.19 sec Goal status: INITIAL  3.  Pt will improve MiniBESTest score to at least 18/28 to decrease fall risk. Baseline: 12/28 Goal status: INITIAL  4.  Pt will improve 3 M walk backwards to <7 seconds with NO HASTENING, for improved gait efficiency and safety. Baseline: 7.37 sec with hastening Goal status: INITIAL  5.  Pt will perform floor>stand transfer, mod I, for improved fall recovery.  Baseline:  Needs son's assist  Goal status:  INITIAL   ASSESSMENT:  CLINICAL IMPRESSION: Pt presents today with reports of a fall yesterday, backing up in the yard while raking.  He reports no injuries and being able to get up on his own.  Really worked in session today to facilitate posterior balance recovery.  Pt tends to lead with  upper trunk and shoulders and needs mod assist to hinge at hips with resisted gait activities.  Worked on balance recovery, trying to reinforce large steps and quick forward/side steps to widen BOS and recovery to midline.  Pt often needs UE support for this.  He will need continued practice.   Pt will continue to benefit from skilled PT towards goals for improved functional mobility and decreased fall risk.   OBJECTIVE IMPAIRMENTS: Abnormal gait, decreased balance, decreased mobility, difficulty walking, decreased strength, impaired flexibility, and postural dysfunction.   ACTIVITY LIMITATIONS: standing, transfers, and locomotion level  PARTICIPATION LIMITATIONS: shopping, community activity, and yard work  PERSONAL FACTORS: 3+ comorbidities: see PMH  are also affecting patient's functional outcome.   REHAB POTENTIAL: Good  CLINICAL DECISION MAKING: Evolving/moderate complexity  EVALUATION COMPLEXITY: Moderate  PLAN:  PT FREQUENCY: 2x/week  PT DURATION: 4 weeks including 03/08/2024 visit  PLANNED INTERVENTIONS: 97110-Therapeutic exercises, 97530- Therapeutic activity, 97112- Neuromuscular re-education, 97535- Self Care, 16109- Manual therapy, [phone]- Gait training, [phone]- Canalith repositioning, Patient/Family education, Balance training, Vestibular training, and DME instructions  PLAN FOR NEXT SESSION: Continue work on balance reactions in posterior direction, strategies, posture, SLS.  Simulate outdoor surfaces or walk outdoors   Zellie Jenning W., PT 03/10/2024, 11:04 AM  [facility] at [facility] [address] [place], [address] [place], [place] [address] Phone # [phone] Fax # [phone]  Referring diagnosis? R26.9 (ICD-10-CM) - Abnormality of gait and mobility  Treatment diagnosis? (if different than referring diagnosis) R26.81, R26.89, M62.81, R29.818 What was this (referring dx) caused by? []  Surgery [x]  Fall [x]  Ongoing issue []  Arthritis []   Other: ____________  Laterality: []  Rt [x]  Lt [x]  Both  Check all possible CPT codes:  *CHOOSE 10 OR LESS*    See Planned Interventions listed in the Plan section of the Evaluation.

## 2024-03-17 DIAGNOSIS — R3915 Urgency of urination: Secondary | ICD-10-CM | POA: Diagnosis not present

## 2024-03-17 DIAGNOSIS — D4101 Neoplasm of uncertain behavior of right kidney: Secondary | ICD-10-CM | POA: Diagnosis not present

## 2024-03-17 DIAGNOSIS — N401 Enlarged prostate with lower urinary tract symptoms: Secondary | ICD-10-CM | POA: Diagnosis not present

## 2024-03-21 DIAGNOSIS — H6123 Impacted cerumen, bilateral: Secondary | ICD-10-CM | POA: Diagnosis not present

## 2024-03-21 DIAGNOSIS — L299 Pruritus, unspecified: Secondary | ICD-10-CM | POA: Diagnosis not present

## 2024-03-21 DIAGNOSIS — H903 Sensorineural hearing loss, bilateral: Secondary | ICD-10-CM | POA: Diagnosis not present

## 2024-03-21 DIAGNOSIS — M48061 Spinal stenosis, lumbar region without neurogenic claudication: Secondary | ICD-10-CM | POA: Diagnosis not present

## 2024-03-21 DIAGNOSIS — M5416 Radiculopathy, lumbar region: Secondary | ICD-10-CM | POA: Diagnosis not present

## 2024-03-22 ENCOUNTER — Ambulatory Visit: Admitting: Physical Therapy

## 2024-03-22 ENCOUNTER — Encounter: Payer: Self-pay | Admitting: Physical Therapy

## 2024-03-22 DIAGNOSIS — R2681 Unsteadiness on feet: Secondary | ICD-10-CM | POA: Diagnosis not present

## 2024-03-22 DIAGNOSIS — R2689 Other abnormalities of gait and mobility: Secondary | ICD-10-CM

## 2024-03-22 DIAGNOSIS — M6281 Muscle weakness (generalized): Secondary | ICD-10-CM | POA: Diagnosis not present

## 2024-03-22 DIAGNOSIS — R29818 Other symptoms and signs involving the nervous system: Secondary | ICD-10-CM | POA: Diagnosis not present

## 2024-03-22 NOTE — Therapy (Signed)
 OUTPATIENT PHYSICAL THERAPY NEURO TREATMENT NOTE   Patient Name: Scott Wang MRN: [id number] DOB:September 06, 1941, 83 y.o., male Today's Date: 03/22/2024   PCP: Windell Hasty, DO  REFERRING PROVIDER: Adelaide Adjutant, MD      END OF SESSION:  PT End of Session - 03/22/24 1221     Visit Number 18    Number of Visits 23    Date for PT Re-Evaluation 04/01/24    Authorization Type [employer]    Authorization Time Period 03/08/2024-04/01/2024    Authorization - Visit Number 2    Authorization - Number of Visits 8    Progress Note Due on Visit 20    PT Start Time 1225    PT Stop Time 1310    PT Time Calculation (min) 45 min    Equipment Utilized During Treatment Gait belt    Activity Tolerance Patient tolerated treatment well    Behavior During Therapy WFL for tasks assessed/performed                          Past Medical History:  Diagnosis Date   Carpal tunnel syndrome    bilateral, had shots in each one and "haven't had any problems since".   Diabetes mellitus    Hyperlipidemia    Hypertension    Parkinson's disease (HCC) 2021   Prostate cancer (HCC)    Spinal stenosis    Past Surgical History:  Procedure Laterality Date   HERNIA REPAIR     HERNIA REPAIR     INGUINAL HERNIA REPAIR     KNEE SURGERY  right   KNEE SURGERY Left    PROSTATE BIOPSY     RADIOACTIVE SEED IMPLANT N/A 09/07/2018   Procedure: RADIOACTIVE SEED IMPLANT/BRACHYTHERAPY IMPLANT;  Surgeon: Christina Coyer, MD;  Location: [facility];  Service: Urology;  Laterality: N/A;   SPACE OAR INSTILLATION N/A 09/07/2018   Procedure: SPACE OAR INSTILLATION;  Surgeon: Christina Coyer, MD;  Location: [facility];  Service: Urology;  Laterality: N/A;   Patient Active Problem List   Diagnosis Date Noted   AKI (acute kidney injury) (HCC) 09/10/2023   Orthostatic hypotension 11/20/2021   not drinking enough fluids 11/20/2021   Imbalance 11/20/2021   Memory loss  11/20/2021   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   Muscle stiffness due to parkinson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  Haven't really been out in the yard.  Pt accompanied by: significant other, wife in lobby  PERTINENT HISTORY: BLE pain, severe spinal stenosis L4-5, L3-4, Parkinson's, R hip pain, BLE lymphedema  PAIN:  Are you having pain? No  PRECAUTIONS: Fall  RED FLAGS: None   WEIGHT BEARING RESTRICTIONS: No  FALLS: Has patient fallen in last 6 months? Yes. Number of falls 3  LIVING ENVIRONMENT: Lives with: lives with their spouse Lives in: House/apartment Stairs: Yes: External: 2-3 steps; can reach both Has following equipment at home: Single point cane, Walker - 2 wheeled, and Wheelchair (manual)  PLOF: Independent  PATIENT GOALS: Pt wants to get back to normal.  OBJECTIVE:      TODAY'S TREATMENT: 03/22/2024 Activity Comments  Sit to stand, 3 sets x 5 reps-initial cues for increased forward lean/hinge at hips  Holding yellow weighted ball  Sit to stand with resistance from posterior direction, 2 x 5 reps Blue theraband, to increase forward lean/hinge at hips  Resisted forward  walking gait with blue theraband at waist, 25 ft x 6 reps Cues for upright posture  Backwards walking with quick stops, cues for stagger stance position Supervision, better balance with stagger stance position when stopped  Forward/back walking on mat surface Strong posterior lean in backwards direction; would fall if unaided  Standing hip strength-side step over obstacle, forward/back step, 10 reps Marching in place, 10 reps Stagger stance forward/back rock 3#    Difficulty sequencing  Gait outdoors, paved areas, with single walking pole, practicing with stepping over obstacles (flat, then raised) Cues for pole placement to step over obstacle; difficulty sequencing walking pole with gait, but does well with obstacle       PATIENT EDUCATION: Education details: Discussed/educated pt in improved safety with outdoor/unlevel surface gait and negotiating obstacles with single walking pole to assist, given pt's increased tendency for posterior LOB Person educated: Patient Education method: Explanation and Verbal cues Education comprehension: verbalized understanding   HOME EXERCISE PROGRAM:  Access Code: [email or web address] URL: [email or web address] Date: 12/28/2023; 3/10/20205, 03/08/2024 Prepared by: [facility] - Outpatient  Rehab - [facility]  *VERBALLY ADDED:  Walking 2 minutes in hallway or laps in home, 3x/day with focus on POSTURE, step length/foot clearance-01/12/2024 *Verbally added PWR! Up, PWR! [place], [place]! Step in standing, 10 reps, 1-2x/day  Exercises - Sit to Stand  - 1 x daily - 7 x weekly - 3 sets - 5 reps - Seated March  - 1 x daily - 7 x weekly - 3 sets - 10 reps - Seated Long Arc Quad  - 1 x daily - 7 x weekly - 3 sets - 10 reps - 3 sec hold - Seated Ankle Dorsiflexion AROM  - 1 x daily - 7 x weekly - 3 sets - 10 reps - 3 sec hold - Standing Gastroc Stretch at Counter  - 1 x daily - 7 x weekly - 1 sets - 3 reps - 30 sec hold - Side Stepping with Counter Support   - 1 x daily - 5 x weekly - 2 sets - 10 reps - Alternating Step Backward with Support  - 1 x daily - 5 x weekly - 2 sets - 10 reps  *Also do forward/back stepping, 10 in a row, UE support at counter - Alternating Step Taps with Counter Support  - 1 x daily - 5 x weekly - 2 sets - 10 reps      --------------------------------------------------------------------------------------------------------------- Note: Objective  measures were completed at Evaluation unless otherwise noted.  DIAGNOSTIC FINDINGS: NA  COGNITION: Overall cognitive status: Within functional limits for tasks assessed   POSTURE: rounded shoulders, forward head, and bilat UT tremors  LOWER EXTREMITY ROM:     Active  Right Eval Left Eval  Hip flexion    Hip extension    Hip abduction    Hip adduction    Hip internal rotation    Hip external rotation    Knee flexion    Knee extension -10 -12  Ankle dorsiflexion    Ankle plantarflexion    Ankle inversion    Ankle eversion     (Blank rows = not tested)  LOWER EXTREMITY MMT:    MMT Right Eval Left Eval  Hip flexion 4 4  Hip extension    Hip abduction 4 4  Hip adduction 4 4  Hip internal rotation    Hip external rotation    Knee flexion 4 4  Knee extension 4 4  Ankle dorsiflexion 3+ 3+  Ankle plantarflexion    Ankle inversion    Ankle eversion    (Blank rows = not tested)   TRANSFERS: Assistive device utilized: None  Sit to stand: SBA Stand to sit: SBA   GAIT: Gait pattern: step to pattern, step through pattern, decreased arm swing- Right, decreased arm swing- Left, decreased step length- Right, decreased step length- Left, shuffling, poor foot clearance- Right, and poor foot clearance- Left Distance walked: 50 ft x 2 Assistive device utilized: None Level of assistance: SBA Comments: increased BUE tremor, shuffling gait pattern, forward flexed posture  FUNCTIONAL TESTS:  5 times sit to stand: 20.22 sec Timed up and go (TUG): 29.72  sec 10 meter walk test: 19.97 sec = 1.64 ft/sec 360 turn:   R 12 steps   L 9 steps 55M backward walk:  19.93 sec TUG cognitive:30.89 sec TUG manual:  23.5 sec                                                                                                                               TREATMENT DATE: 12/17/2023    PATIENT EDUCATION: Education details: Eval results, POC, HEP initiated Person educated: Patient Education method: Explanation, Demonstration, and Handouts Education comprehension: verbalized understanding  HOME EXERCISE PROGRAM: Access Code: [email or web address] URL: [email or web address] Date: 12/17/2023 Prepared by: [facility] - Outpatient  Rehab - [facility]  Exercises - Sit to Stand  - 1 x daily - 7 x weekly - 3 sets - 5 reps - Seated March  - 1 x daily - 7 x weekly - 3 sets - 10 reps - Seated Long Arc Quad  - 1 x daily - 7 x weekly - 3 sets - 10 reps - 3 sec hold - Seated Ankle Dorsiflexion AROM  - 1 x daily - 7 x weekly - 3 sets - 10 reps - 3 sec hold  GOALS: Goals reviewed with patient? Yes  LONG TERM GOALS: Target date: 04/01/2024  Pt will be independent with advanced HEP for improved strength, balance, transfers, and gait. Baseline:  Goal status: IN PROGRESS  2.  Pt will improve 5x sit<>stand to less than or equal to 15 sec to demonstrate improved functional strength and transfer efficiency. Baseline: 16.19 sec Goal status: IN PROGRESS  3.  Pt will improve MiniBESTest score to at least 18/28 to decrease fall risk. Baseline: 12/28 Goal status: IN PROGRESS  4.  Pt will improve 3 M walk backwards to <7 seconds with NO HASTENING, for improved gait efficiency and safety. Baseline: 7.37 sec with hastening Goal status: IN PROGRESS  5.  Pt will perform floor>stand transfer, mod I, for improved fall recovery.  Baseline:  Needs son's assist  Goal status: IN PROGRESS   ASSESSMENT:  CLINICAL IMPRESSION: Pt presents today with no reported falls  since last visit.  Worked on improved posture for standing tall for forward gait and hinge position at hips for improved posterior balance.  With addition of compliant mat surface in posterior direction, pt has decreased foot clearance forwards and increased retropulsion in posterior direction; he would fall if unaided in posterior direction.  Educated pt in importance of use of cane or walking pole outdoors and trialed this, with improvement in balance, especially in foot clearance for obstacles.  Pt remains at high fall risk and demo decreased safety awareness, difficulty with additional task complexity.  Pt will continue to benefit from skilled PT towards goals for improved functional mobility and decreased fall risk.   OBJECTIVE IMPAIRMENTS: Abnormal gait, decreased balance, decreased mobility, difficulty walking, decreased strength, impaired flexibility, and postural dysfunction.   ACTIVITY LIMITATIONS: standing, transfers, and locomotion level  PARTICIPATION LIMITATIONS: shopping, community activity, and yard work  PERSONAL FACTORS: 3+ comorbidities: see PMH are also affecting patient's functional outcome.   REHAB POTENTIAL: Good  CLINICAL DECISION MAKING: Evolving/moderate complexity  EVALUATION COMPLEXITY: Moderate  PLAN:  PT FREQUENCY: 2x/week  PT DURATION: 4 weeks including 03/08/2024 visit  PLANNED INTERVENTIONS: 97110-Therapeutic exercises, 97530- Therapeutic activity, 97112- Neuromuscular re-education, 97535- Self Care, 40981- Manual therapy, [phone]- Gait training, [phone]- Canalith repositioning, Patient/Family education, Balance training, Vestibular training, and DME instructions  PLAN FOR NEXT SESSION: Work towards LTGs.  How did walking pole go for outdoors?  Continue work on balance reactions in posterior direction, strategies, posture, SLS.  Simulate outdoor surfaces or walk outdoors   Nikita Humble W., PT 03/22/2024, 1:16 PM  [facility]patient Rehab at [facility] [address] [place], [address] [place], [place] [address] Phone # [phone] Fax # [phone]

## 2024-03-28 ENCOUNTER — Encounter: Payer: Self-pay | Admitting: Physical Therapy

## 2024-03-28 ENCOUNTER — Ambulatory Visit: Admitting: Physical Therapy

## 2024-03-28 DIAGNOSIS — R2681 Unsteadiness on feet: Secondary | ICD-10-CM

## 2024-03-28 DIAGNOSIS — R29818 Other symptoms and signs involving the nervous system: Secondary | ICD-10-CM | POA: Diagnosis not present

## 2024-03-28 DIAGNOSIS — R2689 Other abnormalities of gait and mobility: Secondary | ICD-10-CM | POA: Diagnosis not present

## 2024-03-28 DIAGNOSIS — M6281 Muscle weakness (generalized): Secondary | ICD-10-CM | POA: Diagnosis not present

## 2024-03-28 NOTE — Therapy (Signed)
 OUTPATIENT PHYSICAL THERAPY NEURO TREATMENT NOTE   Patient Name: Scott Wang MRN: [id number] DOB:Aug 03, 1941, 83 y.o., male Today's Date: 03/28/2024   PCP: Windell Hasty, DO  REFERRING PROVIDER: Adelaide Adjutant, MD      END OF SESSION:  PT End of Session - 03/28/24 1020     Visit Number 19    Number of Visits 23    Date for PT Re-Evaluation 04/01/24    Authorization Type [employer]    Authorization Time Period 03/08/2024-04/01/2024    Authorization - Visit Number 3    Authorization - Number of Visits 8    Progress Note Due on Visit 20    PT Start Time 1021    PT Stop Time 1100    PT Time Calculation (min) 39 min    Equipment Utilized During Treatment Gait belt    Activity Tolerance Patient tolerated treatment well    Behavior During Therapy WFL for tasks assessed/performed                           Past Medical History:  Diagnosis Date   Carpal tunnel syndrome    bilateral, had shots in each one and "haven't had any problems since".   Diabetes mellitus    Hyperlipidemia    Hypertension    Parkinson's disease (HCC) 2021   Prostate cancer (HCC)    Spinal stenosis    Past Surgical History:  Procedure Laterality Date   HERNIA REPAIR     HERNIA REPAIR     INGUINAL HERNIA REPAIR     KNEE SURGERY  right   KNEE SURGERY Left    PROSTATE BIOPSY     RADIOACTIVE SEED IMPLANT N/A 09/07/2018   Procedure: RADIOACTIVE SEED IMPLANT/BRACHYTHERAPY IMPLANT;  Surgeon: Christina Coyer, MD;  Location: [facility];  Service: Urology;  Laterality: N/A;   SPACE OAR INSTILLATION N/A 09/07/2018   Procedure: SPACE OAR INSTILLATION;  Surgeon: Christina Coyer, MD;  Location: [facility];  Service: Urology;  Laterality: N/A;   Patient Active Problem List   Diagnosis Date Noted   AKI (acute kidney injury) (HCC) 09/10/2023   Orthostatic hypotension 11/20/2021   not drinking enough fluids 11/20/2021   Imbalance 11/20/2021   Memory  loss 11/20/2021   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   Muscle stiffness due to parkinson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  Pt accompanied by: significant other, wife in lobby  PERTINENT HISTORY: BLE pain, severe spinal stenosis L4-5, L3-4, Parkinson's, R hip pain, BLE lymphedema  PAIN:  Are you having pain? No  PRECAUTIONS: Fall  RED FLAGS: None   WEIGHT BEARING RESTRICTIONS: No  FALLS: Has patient fallen in last 6 months? Yes. Number of falls 3  LIVING ENVIRONMENT: Lives with: lives with their spouse Lives in: House/apartment Stairs: Yes: External: 2-3 steps; can reach both Has following equipment at home: Single point cane, Walker - 2 wheeled, and Wheelchair (manual)  PLOF: Independent  PATIENT GOALS: Pt wants to get back to normal.  OBJECTIVE:    TODAY'S TREATMENT: 03/28/2024 Activity Comments  Reviewed HEP -sit to stand -PWR! Moves standing -back step and weightshift -Step taps Needs occasional cues for SLOW, deliberate pace, but pt reports doing these at home  Forward/back walking at outside of parallel bars Counting steps, cues for deliberate pace, hinge at hips   Gait outdoors, paved areas and grassy areas, with single walking pole, practicing with stepping over flat obstacles  Cues for pole placement to step over obstacle; difficulty sequencing walking pole with gait, but does well with obstacle Cues for foot clearance with grassy areas, supervision throughout  Floor to stand transfer, 2 reps At mat surface, with UE support, supervision and cues to use momentum to get from side sit to tall kneel               PATIENT EDUCATION: Education details: Walking pole use for outdoor surfaces, addition to HEP for backward walking, several steps along counter (have someone with him for safety); discussed/practiced safety with floor to stand transfers Person educated: Patient Education method: Explanation and Verbal cues Education comprehension: verbalized understanding   HOME EXERCISE PROGRAM:  Access Code: [email or web address] URL: [email or web address] Date: 03/28/2024 Prepared by: [facility] - Outpatient  Rehab - [facility]  Exercises - Sit to Stand  - 1 x daily - 7 x weekly - 3 sets - 5 reps - Seated March  - 1 x daily - 7 x weekly - 3 sets - 10 reps - Seated Long Arc Quad  - 1 x daily - 7 x weekly - 3 sets - 10 reps - 3 sec hold - Seated Ankle Dorsiflexion AROM  - 1 x daily - 7 x weekly - 3 sets - 10 reps - 3 sec hold - Standing Gastroc Stretch at Counter  - 1 x daily - 7 x weekly - 1 sets - 3 reps - 30 sec hold - Side Stepping with Counter Support  - 1 x daily - 5 x weekly - 2 sets - 10 reps - Alternating Step Backward with Support  - 1 x daily - 5 x weekly - 2 sets - 10 reps - Alternating Step Taps with Counter Support  - 1 x daily - 5 x weekly - 2 sets - 10 reps - Backward Walking with Counter Support  - 1 x daily - 7 x weekly - 1 sets - 3-5 reps   --------------------------------------------------------------------------------------------------------------- Note: Objective measures were completed at Evaluation unless otherwise  noted.  DIAGNOSTIC FINDINGS: NA  COGNITION: Overall cognitive status: Within functional limits for tasks assessed   POSTURE: rounded shoulders, forward head, and bilat UT tremors  LOWER EXTREMITY ROM:     Active  Right Eval Left Eval  Hip flexion    Hip extension    Hip  abduction    Hip adduction    Hip internal rotation    Hip external rotation    Knee flexion    Knee extension -10 -12  Ankle dorsiflexion    Ankle plantarflexion    Ankle inversion    Ankle eversion     (Blank rows = not tested)  LOWER EXTREMITY MMT:    MMT Right Eval Left Eval  Hip flexion 4 4  Hip extension    Hip abduction 4 4  Hip adduction 4 4  Hip internal rotation    Hip external rotation    Knee flexion 4 4  Knee extension 4 4  Ankle dorsiflexion 3+ 3+  Ankle plantarflexion    Ankle inversion    Ankle eversion    (Blank rows = not tested)   TRANSFERS: Assistive device utilized: None  Sit to stand: SBA Stand to sit: SBA   GAIT: Gait pattern: step to pattern, step through pattern, decreased arm swing- Right, decreased arm swing- Left, decreased step length- Right, decreased step length- Left, shuffling, poor foot clearance- Right, and poor foot clearance- Left Distance walked: 50 ft x 2 Assistive device utilized: None Level of assistance: SBA Comments: increased BUE tremor, shuffling gait pattern, forward flexed posture  FUNCTIONAL TESTS:  5 times sit to stand: 20.22 sec Timed up and go (TUG): 29.72 sec 10 meter walk test: 19.97 sec = 1.64 ft/sec 360 turn:   R 12 steps   L 9 steps 54M backward walk:  19.93 sec TUG cognitive:30.89 sec TUG manual:  23.5 sec                                                                                                                               TREATMENT DATE: 12/17/2023    PATIENT EDUCATION: Education details: Eval results, POC, HEP initiated Person educated: Patient Education method: Explanation, Demonstration, and  Handouts Education comprehension: verbalized understanding  HOME EXERCISE PROGRAM: Access Code: [email or web address] URL: [email or web address] Date: 12/17/2023 Prepared by: [facility] - Outpatient  Rehab - [facility]  Exercises - Sit to Stand  - 1 x daily - 7 x weekly - 3 sets - 5 reps - Seated March  - 1 x daily - 7 x weekly - 3 sets - 10 reps - Seated Long Arc Quad  - 1 x daily - 7 x weekly - 3 sets - 10 reps - 3 sec hold - Seated Ankle Dorsiflexion AROM  - 1 x daily - 7 x weekly - 3 sets - 10 reps - 3 sec hold  GOALS: Goals reviewed with patient? Yes   LONG TERM GOALS: Target date: 04/01/2024  Pt will be independent with advanced HEP for improved strength, balance, transfers, and gait. Baseline:  Goal status: IN PROGRESS  2.  Pt will improve 5x sit<>stand to less than or equal to 15 sec to demonstrate improved functional strength and transfer efficiency. Baseline: 16.19 sec Goal status: IN PROGRESS  3.  Pt will improve MiniBESTest score to at least 18/28 to decrease fall risk. Baseline: 12/28 Goal status: IN PROGRESS  4.  Pt will improve 3 M walk backwards to <7 seconds with NO HASTENING, for improved gait efficiency and safety. Baseline: 7.37 sec with hastening Goal status: IN PROGRESS  5.  Pt will perform floor>stand transfer, mod I, for improved fall recovery.  Baseline:  Needs supervision and UE support in clinic; reports his assist level varies on his position  Goal status: NOT MET,03/28/2024   ASSESSMENT:  CLINICAL IMPRESSION: Pt presents today with no new complaints, no new falls. Skilled PT session focused on gait activities outdoors, review of HEP and transfer practice.  Added backwards walk at counter to pt's HEP, with cues to have family with him for safety.  Pt continues to need cues to slow pace with exercises and with gait.  He does appear safer on outdoor surfaces with walking pole.  Pt will continue to benefit from skilled PT towards goals for  improved functional mobility and decreased fall risk.   OBJECTIVE IMPAIRMENTS: Abnormal gait, decreased balance, decreased mobility, difficulty walking, decreased strength, impaired flexibility, and postural dysfunction.   ACTIVITY LIMITATIONS: standing, transfers, and locomotion level  PARTICIPATION LIMITATIONS: shopping, community activity, and yard work  PERSONAL FACTORS: 3+ comorbidities: see PMH are also affecting patient's functional outcome.   REHAB POTENTIAL: Good  CLINICAL DECISION MAKING: Evolving/moderate complexity  EVALUATION COMPLEXITY: Moderate  PLAN:  PT FREQUENCY: 2x/week  PT DURATION: 4 weeks including 03/08/2024 visit  PLANNED INTERVENTIONS: 97110-Therapeutic exercises, 97530- Therapeutic activity, 97112- Neuromuscular re-education, 97535- Self Care, 16109- Manual therapy, [phone]- Gait training, [phone]- Canalith repositioning, Patient/Family education, Balance training, Vestibular training, and DME instructions  PLAN FOR NEXT SESSION: Check LTGs and discuss d/c, return plans for PD screen/eval.  Continue work on balance reactions in posterior direction, strategies, posture, SLS.     Kelsey Patricia., PT 03/28/2024, 11:04 AM  [facility] at [facility] [address] [place], [address] [place], [place] [address] Phone # [phone] Fax # [phone]

## 2024-03-31 ENCOUNTER — Ambulatory Visit: Admitting: Physical Therapy

## 2024-03-31 ENCOUNTER — Encounter: Payer: Self-pay | Admitting: Physical Therapy

## 2024-03-31 DIAGNOSIS — R2689 Other abnormalities of gait and mobility: Secondary | ICD-10-CM | POA: Diagnosis not present

## 2024-03-31 DIAGNOSIS — R2681 Unsteadiness on feet: Secondary | ICD-10-CM | POA: Diagnosis not present

## 2024-03-31 DIAGNOSIS — M6281 Muscle weakness (generalized): Secondary | ICD-10-CM | POA: Diagnosis not present

## 2024-03-31 DIAGNOSIS — R29818 Other symptoms and signs involving the nervous system: Secondary | ICD-10-CM | POA: Diagnosis not present

## 2024-03-31 NOTE — Therapy (Signed)
 OUTPATIENT PHYSICAL THERAPY NEURO TREATMENT NOTE/DISCHARGE SUMMARY   Patient Name: Scott Wang MRN: [id number] DOB:Nov 29, 1940, 83 y.o., male Today's Date: 03/31/2024   PCP: Windell Hasty, DO  REFERRING PROVIDER: Adelaide Adjutant, MD   PHYSICAL THERAPY DISCHARGE SUMMARY  Visits from Start of Care: 20  Current functional level related to goals / functional outcomes: See below- pt has met 3 of 5 LTGs and partially met 1 LTG.   Remaining deficits: Balance, posture, gait   Education / Equipment: Educated in HEP progression, safety with gait  Patient agrees to discharge. Patient goals were partially met. Patient is being discharged due to being pleased with the current functional level. Recommend return PT eval in 6 months due to progressive nature of disease process.    END OF SESSION:  PT End of Session - 03/31/24 1020     Visit Number 20    Number of Visits 23    Date for PT Re-Evaluation 04/01/24    Authorization Type [employer]    Authorization Time Period 03/08/2024-04/01/2024    Authorization - Visit Number 4    Authorization - Number of Visits 8    Progress Note Due on Visit 20    PT Start Time 1018    PT Stop Time 1050    PT Time Calculation (min) 32 min    Equipment Utilized During Treatment Gait belt    Activity Tolerance Patient tolerated treatment well    Behavior During Therapy WFL for tasks assessed/performed                            Past Medical History:  Diagnosis Date   Carpal tunnel syndrome    bilateral, had shots in each one and "haven't had any problems since".   Diabetes mellitus    Hyperlipidemia    Hypertension    Parkinson's disease (HCC) 2021   Prostate cancer (HCC)    Spinal stenosis    Past Surgical History:  Procedure Laterality Date   HERNIA REPAIR     HERNIA REPAIR     INGUINAL HERNIA REPAIR     KNEE SURGERY  right   KNEE SURGERY Left    PROSTATE BIOPSY     RADIOACTIVE SEED IMPLANT N/A 09/07/2018    Procedure: RADIOACTIVE SEED IMPLANT/BRACHYTHERAPY IMPLANT;  Surgeon: Christina Coyer, MD;  Location: [facility];  Service: Urology;  Laterality: N/A;   SPACE OAR INSTILLATION N/A 09/07/2018   Procedure: SPACE OAR INSTILLATION;  Surgeon: Christina Coyer, MD;  Location: [facility];  Service: Urology;  Laterality: N/A;   Patient Active Problem List   Diagnosis Date Noted   AKI (acute kidney injury) (HCC) 09/10/2023   Orthostatic hypotension 11/20/2021   not drinking enough fluids 11/20/2021   Imbalance 11/20/2021   Memory loss 11/20/2021   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   Muscle stiffness due to parkinson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    Pt accompanied by: significant other, wife in lobby  PERTINENT HISTORY: BLE pain, severe spinal stenosis L4-5, L3-4, Parkinson's, R hip pain, BLE lymphedema  PAIN:  Are you having pain? No  PRECAUTIONS: Fall  RED FLAGS: None   WEIGHT BEARING RESTRICTIONS: No  FALLS: Has patient fallen in last 6 months? Yes. Number of falls 3  LIVING ENVIRONMENT: Lives with: lives with their spouse Lives in:  House/apartment Stairs: Yes: External: 2-3 steps; can reach both Has following equipment at home: Single point cane, Walker - 2 wheeled, and Wheelchair (manual)  PLOF: Independent  PATIENT GOALS: Pt wants to get back to normal.  OBJECTIVE:    TODAY'S TREATMENT: 03/31/2024 Activity Comments  FTSTS:14.94 sec Improved from >16 sec  TUG:  12.66 sec TUG cognitive:  14.31 sec  Stops counting after turn  MiniBESTest:  19/28 Improved from 12/28  35M walk: 14.78 sec = 2.22 ft/sec Improved from 1.64 ft/sec at eval  3 M backward: 7.59 sec No hastening  Reviewed backward walking at counter Good form         PATIENT EDUCATION: Education details: Progress towards goals, POC; discussed discharge this visit with plans for return PT eval in 6 months Person educated: Patient Education method: Explanation and Verbal cues Education comprehension: verbalized understanding   HOME EXERCISE PROGRAM:  Access Code: [email or web address] URL: [email or web address] Date: 03/28/2024 Prepared by: [facility] - Outpatient  Rehab - [facility]  Exercises - Sit to Stand  - 1 x daily - 7 x weekly - 3 sets - 5 reps - Seated March  - 1 x daily - 7 x weekly - 3 sets - 10 reps - Seated Long Arc Quad  - 1 x daily - 7 x weekly - 3 sets - 10 reps - 3 sec hold - Seated Ankle Dorsiflexion AROM  - 1 x daily - 7 x weekly - 3 sets - 10 reps - 3 sec hold - Standing Gastroc Stretch at Counter  - 1 x daily - 7 x weekly - 1 sets - 3 reps - 30 sec hold - Side Stepping with Counter Support  - 1 x daily - 5 x weekly - 2 sets - 10 reps - Alternating Step Backward with Support  - 1 x daily - 5 x weekly - 2 sets - 10 reps - Alternating Step Taps with Counter Support  - 1 x daily - 5 x weekly - 2 sets - 10 reps - Backward Walking with Counter Support  - 1 x daily - 7 x weekly - 1 sets - 3-5 reps   --------------------------------------------------------------------------------------------------------------- Note:  Objective measures were completed at Evaluation unless otherwise noted.  DIAGNOSTIC FINDINGS: NA  COGNITION: Overall cognitive status: Within functional limits for tasks assessed   POSTURE: rounded shoulders, forward head, and bilat UT tremors  LOWER EXTREMITY ROM:     Active  Right Eval Left Eval  Hip flexion    Hip extension    Hip abduction    Hip adduction    Hip internal rotation    Hip external rotation    Knee flexion    Knee extension -10 -12  Ankle dorsiflexion    Ankle plantarflexion    Ankle inversion    Ankle eversion     (Blank rows = not tested)  LOWER EXTREMITY MMT:    MMT Right Eval Left Eval  Hip flexion 4 4  Hip extension    Hip abduction 4 4  Hip adduction 4 4  Hip internal rotation    Hip external rotation    Knee flexion 4 4  Knee extension 4 4  Ankle dorsiflexion 3+ 3+  Ankle plantarflexion    Ankle inversion    Ankle eversion    (Blank rows = not tested)   TRANSFERS: Assistive device utilized: None  Sit to stand: SBA Stand to sit: SBA   GAIT: Gait pattern: step to pattern, step through pattern, decreased arm swing- Right, decreased arm swing- Left, decreased step length- Right, decreased step length- Left, shuffling, poor foot clearance- Right, and poor foot clearance- Left Distance walked: 50 ft x 2 Assistive device utilized: None Level of assistance: SBA Comments: increased BUE tremor, shuffling gait pattern, forward flexed posture  FUNCTIONAL TESTS:  5 times sit to stand: 20.22 sec Timed up and go (TUG): 29.72 sec 10 meter walk test: 19.97 sec = 1.64 ft/sec 360 turn:   R 12 steps   L 9 steps 28M backward walk:  19.93 sec TUG cognitive:30.89 sec TUG manual:  23.5 sec                                                                                                                               TREATMENT DATE: 12/17/2023    PATIENT EDUCATION: Education details: Eval results, POC, HEP initiated Person educated:  Patient Education method: Explanation, Demonstration, and Handouts Education comprehension: verbalized understanding  HOME EXERCISE PROGRAM: Access Code: [email or web address] URL: [email or web address] Date: 12/17/2023 Prepared by: [facility] - Outpatient  Rehab - [facility]  Exercises - Sit to Stand  - 1 x daily - 7 x weekly - 3 sets - 5 reps - Seated March  - 1 x daily - 7 x weekly - 3 sets - 10 reps - Seated Long Arc Quad  - 1 x daily - 7 x weekly - 3 sets - 10 reps - 3 sec hold - Seated Ankle Dorsiflexion AROM  - 1 x daily - 7 x weekly - 3 sets - 10 reps - 3 sec hold  GOALS: Goals reviewed with patient? Yes   LONG TERM GOALS: Target date: 04/01/2024  Pt will be independent with advanced HEP for improved strength, balance, transfers, and gait. Baseline:  Goal status: IN PROGRESS  2.  Pt will improve 5x sit<>stand to less than or equal to 15 sec to demonstrate improved functional strength and transfer efficiency. Baseline: 16.19 sec Goal status: IN PROGRESS  3.  Pt will improve MiniBESTest score to at least 18/28 to decrease fall risk. Baseline: 12/28 Goal status: IN PROGRESS  4.  Pt will improve 3 M walk backwards to <7 seconds with NO HASTENING, for improved gait efficiency and safety. Baseline: 7.37 sec with hastening Goal status: IN PROGRESS  5.  Pt will perform floor>stand transfer, mod I, for improved fall recovery.  Baseline:  Needs supervision and UE support in clinic; reports his assist level varies on his position  Goal  status: NOT MET,03/28/2024   ASSESSMENT:  CLINICAL IMPRESSION: Pt presents today with no new complaints. Skilled PT session focused on assessing LTGs, with pt meeting 3 of 5 LTGs.  He has met LTG 1 for HEP, LTG 2 for improved FTSTS test, LTG 3 for improved MiniBESTest.  He has improved 3 M backwards walk with no hastening, but not to goal level in time, so LTG 4 is partially met.  He has comprehensive HEP to address balance and educated  patient that he needs to continue HEP, since he is still at high risk for falls. He is in agreement with discharge from PT at this time.  OBJECTIVE IMPAIRMENTS: Abnormal gait, decreased balance, decreased mobility, difficulty walking, decreased strength, impaired flexibility, and postural dysfunction.   ACTIVITY LIMITATIONS: standing, transfers, and locomotion level  PARTICIPATION LIMITATIONS: shopping, community activity, and yard work  PERSONAL FACTORS: 3+ comorbidities: see PMH are also affecting patient's functional outcome.   REHAB POTENTIAL: Good  CLINICAL DECISION MAKING: Evolving/moderate complexity  EVALUATION COMPLEXITY: Moderate  PLAN:  PT FREQUENCY: 2x/week  PT DURATION: 4 weeks including 03/08/2024 visit  PLANNED INTERVENTIONS: 97110-Therapeutic exercises, 97530- Therapeutic activity, 97112- Neuromuscular re-education, 97535- Self Care, 96295- Manual therapy, [phone]- Gait training, [phone]- Canalith repositioning, Patient/Family education, Balance training, Vestibular training, and DME instructions  PLAN FOR NEXT SESSION: Discharge this visit; plan for return PD eval in 6 months.  Kelsey Patricia., PT 03/31/2024, 11:01 AM  [facility] at [facility] [address] [place], [address] [place], [place] [address] Phone # [phone] Fax # [phone]

## 2024-04-04 DIAGNOSIS — R1310 Dysphagia, unspecified: Secondary | ICD-10-CM | POA: Diagnosis not present

## 2024-04-04 DIAGNOSIS — A419 Sepsis, unspecified organism: Secondary | ICD-10-CM | POA: Diagnosis not present

## 2024-04-04 DIAGNOSIS — G20C Parkinsonism, unspecified: Secondary | ICD-10-CM | POA: Diagnosis not present

## 2024-04-04 DIAGNOSIS — M6281 Muscle weakness (generalized): Secondary | ICD-10-CM | POA: Diagnosis not present

## 2024-04-04 DIAGNOSIS — N179 Acute kidney failure, unspecified: Secondary | ICD-10-CM | POA: Diagnosis not present

## 2024-04-04 DIAGNOSIS — E222 Syndrome of inappropriate secretion of antidiuretic hormone: Secondary | ICD-10-CM | POA: Diagnosis not present

## 2024-04-04 DIAGNOSIS — R278 Other lack of coordination: Secondary | ICD-10-CM | POA: Diagnosis not present

## 2024-04-13 ENCOUNTER — Ambulatory Visit: Admitting: Neurology

## 2024-04-13 VITALS — BP 144/76 | HR 63

## 2024-04-13 DIAGNOSIS — K117 Disturbances of salivary secretion: Secondary | ICD-10-CM | POA: Diagnosis not present

## 2024-04-13 MED ORDER — ONABOTULINUMTOXINA 200 UNITS IJ SOLR
100.0000 [IU] | Freq: Once | INTRAMUSCULAR | Status: AC
  Administered 2024-04-13: 100 [IU] via INTRAMUSCULAR

## 2024-04-13 NOTE — Progress Notes (Signed)
 Scott Wang is an 83 year old right-handed gentleman with an underlying medical history of hypertension, hyperlipidemia, prostate cancer, spinal stenosis, diabetes, and Parkinson's disease, associated with memory loss, orthostatic hypotension, and sialorrhea who presents for his 7th Botox  injection for sialorrhea in the context of Parkinson's disease.  The patient is accompanied by his wife today. He had his 6th injection on 01/20/24.    Today, 04/13/2024: He reports tolerating the injection last time, no side effects, would like to go back to 50 units on each side.   The original written informed consent for recurrent, 3 monthly intramuscular injections with botulinum toxin for this indication was obtained on 02/03/2022.  He was consented today and had no questions.  Today's consent was signed.    I have previously talked to the patient in detail about expectations, limitations, benefits as well as potential adverse effects of botulinum toxin injections. The patient understands that the side effects include (but are not limited to): Mouth dryness, dryness of eyes, speech and swallowing difficulties, respiratory depression or problems breathing, weakness of muscles including more distant muscles than the ones injected, flu-like symptoms, myalgias, injection site reactions such as redness, itching, swelling, pain, and infection.  Distant effect of toxin has been explained.   200 units of botulinum toxin type A  in the form of Botox  were reconstituted using preservative-free normal saline to a concentration of 10 units per 0.1 mL and drawn up into 1 mL tuberculin syringes. Lot number and expiration dates as below.     O/E: BP (!) 144/76 (BP Location: Left Arm, Patient Position: Sitting, Cuff Size: Normal)   Pulse 63     The patient was situated in a chair, sitting comfortably. After preparing the areas with 70% isopropyl alcohol  and using a 30 gauge 1/2 inch needle for the facial injections, a total dose of  100 units of botulinum toxin type A  in the form of Botox  was injected into the muscles and the following distribution and quantities:   #1:  50 units in the right parotid gland, 50 units in the left parotid gland.     EMG guidance was not utilized for these injections. 100 units wasted.      The patient tolerated the procedure well without immediate complications. He was advised to make a followup appointment for repeat injections in 3 months from now and encouraged to call us  with any interim questions, concerns, problems, or updates. He was in agreement and did not have any questions prior to leaving clinic today.     Previously (copied from previous notes for reference):    He had his fifth botulinum toxin injection on 02/03/2023, at which time he felt that the increased dose of 100 units total was more effective compared to before.   He had his fourth injection on 11/11/2022, at which time he reported no significant improvement from the previous injection and we increased his total dose to 100 units, 50 units in each parotid gland.     He had his 3rd  injection on 08/11/22, at which time he received 80 units total dose. He felt that the injections thus far had not helped much.      He called in the interim in late September 2023 indicating that the Botox  had not helped.      He had his 2nd Botox  injection on 05/22/2022, at which time we increased his dose to a total of 70 units with 35 units injected in each parotid gland.    He had his  first injection on 02/03/2022, at which time he received 50 units of Botox  total dose divided in both parotid glands.     Botox  100 units. LOT: [id number] EXP: 04/2025, NDC: 5409-8119-14 0 0.9% bacteriostatic saline 1 mL LOT: [id number] EXP: 05/09/2025 NDC: 2130-8657-84

## 2024-04-23 DIAGNOSIS — R55 Syncope and collapse: Secondary | ICD-10-CM | POA: Diagnosis not present

## 2024-04-23 DIAGNOSIS — Z5321 Procedure and treatment not carried out due to patient leaving prior to being seen by health care provider: Secondary | ICD-10-CM | POA: Diagnosis not present

## 2024-04-23 DIAGNOSIS — Z882 Allergy status to sulfonamides status: Secondary | ICD-10-CM | POA: Diagnosis not present

## 2024-04-23 DIAGNOSIS — R079 Chest pain, unspecified: Secondary | ICD-10-CM | POA: Diagnosis not present

## 2024-04-23 DIAGNOSIS — Z87891 Personal history of nicotine dependence: Secondary | ICD-10-CM | POA: Diagnosis not present

## 2024-04-24 DIAGNOSIS — R55 Syncope and collapse: Secondary | ICD-10-CM | POA: Diagnosis not present

## 2024-04-28 DIAGNOSIS — E785 Hyperlipidemia, unspecified: Secondary | ICD-10-CM | POA: Diagnosis not present

## 2024-04-28 DIAGNOSIS — I10 Essential (primary) hypertension: Secondary | ICD-10-CM | POA: Diagnosis not present

## 2024-04-28 DIAGNOSIS — R55 Syncope and collapse: Secondary | ICD-10-CM | POA: Diagnosis not present

## 2024-04-28 DIAGNOSIS — I498 Other specified cardiac arrhythmias: Secondary | ICD-10-CM | POA: Diagnosis not present

## 2024-04-28 DIAGNOSIS — G20A1 Parkinson's disease without dyskinesia, without mention of fluctuations: Secondary | ICD-10-CM | POA: Diagnosis not present

## 2024-05-04 ENCOUNTER — Telehealth: Payer: Self-pay | Admitting: Neurology

## 2024-05-04 NOTE — Telephone Encounter (Signed)
LVM and sent mychart msg informing pt of appt change due to MD being out

## 2024-05-05 ENCOUNTER — Ambulatory Visit: Attending: Cardiovascular Disease | Admitting: Cardiovascular Disease

## 2024-05-05 ENCOUNTER — Ambulatory Visit

## 2024-05-05 ENCOUNTER — Encounter: Payer: Self-pay | Admitting: Cardiovascular Disease

## 2024-05-05 DIAGNOSIS — K219 Gastro-esophageal reflux disease without esophagitis: Secondary | ICD-10-CM | POA: Diagnosis not present

## 2024-05-05 DIAGNOSIS — Z8546 Personal history of malignant neoplasm of prostate: Secondary | ICD-10-CM

## 2024-05-05 DIAGNOSIS — R55 Syncope and collapse: Secondary | ICD-10-CM

## 2024-05-05 DIAGNOSIS — I499 Cardiac arrhythmia, unspecified: Secondary | ICD-10-CM

## 2024-05-05 DIAGNOSIS — E785 Hyperlipidemia, unspecified: Secondary | ICD-10-CM | POA: Diagnosis not present

## 2024-05-05 DIAGNOSIS — R278 Other lack of coordination: Secondary | ICD-10-CM | POA: Diagnosis not present

## 2024-05-05 DIAGNOSIS — G20C Parkinsonism, unspecified: Secondary | ICD-10-CM | POA: Diagnosis not present

## 2024-05-05 DIAGNOSIS — N179 Acute kidney failure, unspecified: Secondary | ICD-10-CM | POA: Diagnosis not present

## 2024-05-05 DIAGNOSIS — R0989 Other specified symptoms and signs involving the circulatory and respiratory systems: Secondary | ICD-10-CM

## 2024-05-05 DIAGNOSIS — I10 Essential (primary) hypertension: Secondary | ICD-10-CM

## 2024-05-05 DIAGNOSIS — E222 Syndrome of inappropriate secretion of antidiuretic hormone: Secondary | ICD-10-CM | POA: Diagnosis not present

## 2024-05-05 DIAGNOSIS — R1310 Dysphagia, unspecified: Secondary | ICD-10-CM | POA: Diagnosis not present

## 2024-05-05 DIAGNOSIS — Z8669 Personal history of other diseases of the nervous system and sense organs: Secondary | ICD-10-CM

## 2024-05-05 DIAGNOSIS — A419 Sepsis, unspecified organism: Secondary | ICD-10-CM | POA: Diagnosis not present

## 2024-05-05 DIAGNOSIS — M6281 Muscle weakness (generalized): Secondary | ICD-10-CM | POA: Diagnosis not present

## 2024-05-05 NOTE — Progress Notes (Unsigned)
 Enrolled patient for a 14 day Zio XT  monitor to be mailed to patients home

## 2024-05-05 NOTE — Patient Instructions (Addendum)
 Medication Instructions:  Your physician recommends that you continue on your current medications as directed. Please refer to the Current Medication list given to you today.  *If you need a refill on your cardiac medications before your next appointment, please call your pharmacy*  Testing/Procedures: Your physician has requested that you have an echocardiogram. Echocardiography is a painless test that uses sound waves to create images of your heart. It provides your doctor with information about the size and shape of your heart and how well your heart's chambers and valves are working. This procedure takes approximately one hour. There are no restrictions for this procedure. Please do NOT wear cologne, perfume, aftershave, or lotions (deodorant is allowed). Please arrive 15 minutes prior to your appointment time.  Please note: We ask at that you not bring children with you during ultrasound (echo/ vascular) testing. Due to room size and safety concerns, children are not allowed in the ultrasound rooms during exams. Our front office staff cannot provide observation of children in our lobby area while testing is being conducted. An adult accompanying a patient to their appointment will only be allowed in the ultrasound room at the discretion of the ultrasound technician under special circumstances. We apologize for any inconvenience.   Your physician has requested that you have a carotid duplex. This test is an ultrasound of the carotid arteries in your neck. It looks at blood flow through these arteries that supply the brain with blood. Allow one hour for this exam. There are no restrictions or special instructions. This will take place at [address], 4th floor  Please note: We ask at that you not bring children with you during ultrasound (echo/ vascular) testing. Due to room size and safety concerns, children are not allowed in the ultrasound rooms during exams. Our front office staff cannot  provide observation of children in our lobby area while testing is being conducted. An adult accompanying a patient to their appointment will only be allowed in the ultrasound room at the discretion of the ultrasound technician under special circumstances. We apologize for any inconvenience.   ZIO XT- Long Term Monitor Instructions  Your physician has requested you wear a ZIO patch monitor for 14 days.  This is a single patch monitor. [employer] supplies one patch monitor per enrollment. Additional stickers are not available. Please do not apply patch if you will be having a Nuclear Stress Test,  Echocardiogram, Cardiac CT, MRI, or Chest Xray during the period you would be wearing the  monitor. The patch cannot be worn during these tests. You cannot remove and re-apply the  ZIO XT patch monitor.  Your ZIO patch monitor will be mailed 3 day USPS to your address on file. It may take 3-5 days  to receive your monitor after you have been enrolled.  Once you have received your monitor, please review the enclosed instructions. Your monitor  has already been registered assigning a specific monitor serial # to you.  Billing and Patient Assistance Program Information  We have supplied [employer] with any of your insurance information on file for billing purposes. [employer] offers a sliding scale Patient Assistance Program for patients that do not have  insurance, or whose insurance does not completely cover the cost of the ZIO monitor.  You must apply for the Patient Assistance Program to qualify for this discounted rate.  To apply, please call [employer] at [phone], select option 4, select option 2, ask to apply for  Patient Assistance Program. [employer] will ask  your household income, and how many people  are in your household. They will quote your out-of-pocket cost based on that information.  [employer] will also be able to set up a 78-month, interest-free payment plan if needed.  Applying the monitor    Shave hair from upper left chest.  Hold abrader disc by orange tab. Rub abrader in 40 strokes over the upper left chest as  indicated in your monitor instructions.  Clean area with 4 enclosed alcohol  pads. Let dry.  Apply patch as indicated in monitor instructions. Patch will be placed under collarbone on left  side of chest with arrow pointing upward.  Rub patch adhesive wings for 2 minutes. Remove white label marked 1. Remove the white  label marked 2. Rub patch adhesive wings for 2 additional minutes.  While looking in a mirror, press and release button in center of patch. A small green light will  flash 3-4 times. This will be your only indicator that the monitor has been turned on.  Do not shower for the first 24 hours. You may shower after the first 24 hours.  Press the button if you feel a symptom. You will hear a small click. Record Date, Time and  Symptom in the Patient Logbook.  When you are ready to remove the patch, follow instructions on the last 2 pages of Patient  Logbook. Stick patch monitor onto the last page of Patient Logbook.  Place Patient Logbook in the blue and white box. Use locking tab on box and tape box closed  securely. The blue and white box has prepaid postage on it. Please place it in the mailbox as  soon as possible. Your physician should have your test results approximately 7 days after the  monitor has been mailed back to [facility].  Call [facility] Customer Care at [phone] if you have questions regarding  your ZIO XT patch monitor. Call them immediately if you see an orange light blinking on your  monitor.  If your monitor falls off in less than 4 days, contact our Monitor department at [phone].  If your monitor becomes loose or falls off after 4 days call [employer] at [phone] for  suggestions on securing your monitor   Follow-Up: At [facility], you and your health needs are our priority.  As part of our  continuing mission to provide you with exceptional heart care, our providers are all part of one team.  This team includes your primary Cardiologist (physician) and Advanced Practice Providers or APPs (Physician Assistants and Nurse Practitioners) who all work together to provide you with the care you need, when you need it.  Your next appointment:   6-8 week(s)  Provider:   Katlyn West, NP or Dr. Jerel Balding   We recommend signing up for the patient portal called MyChart.  Sign up information is provided on this After Visit Summary.  MyChart is used to connect with patients for Virtual Visits (Telemedicine).  Patients are able to view lab/test results, encounter notes, upcoming appointments, etc.  Non-urgent messages can be sent to your provider as well.   To learn more about what you can do with MyChart, go to [email or web address].

## 2024-05-05 NOTE — Progress Notes (Signed)
 Cardiology Office Note    Date:  05/15/2024   ID:  AQUARIUS LATOUCHE, DOB [birth year], MRN [id number]  PCP:  Valentin Skates, DO  Cardiologist:  Debby Sor, MD   New patient evaluation, DOD add-on referred by Corean Bohr, NP at [facility] medical.   History of Present Illness:  Scott Wang is a 83 y.o. male who is followed by Dr. Skates Valentin at [facility] medical.  He has a history of type 2 diabetes mellitus, CKD stage IIIb, hyperlipidemia, IPF/ILD with chronic cough followed by pulmonology, as well as history of prostate cancer status post brachytherapy followed by Dr. Nieves, hyperlipidemia, a 3-year history of Parkinson's disease, who was seen today after experiencing an episode of syncope leading to recent ER evaluation and subsequent primary care evaluation..  Scott Wang was evaluated in [facility] ED on 04/23/2024 in the emergency room after developing an episode of syncope.  He was attending a wedding in [place], the wedding was outside and he had recently come back inside.  It had been very hot outside and he feels that he may have gotten dehydrated.  He went to sit inside and then apparently had a syncopal spell.  Reportedly, when EMS arrived they detected a transient abnormal rhythm with a rate of 230 raising concern for potential underlying arrhythmia and the etiology of his syncopal spell.  He was taken to the emergency room.  Troponins were negative.  ECG was without arrhythmia or ischemia.  He received IV fluids.  Creatinine was 1.55.  Magnesium was 1.6 which was repleted.  Chest x-ray showed normal heart size and mediastinal contour.  There was no effusion or pneumothorax.  Apparently it was advised that the patient be admitted overnight but he felt stable and elected to be sent home that day and signed out AMA.  He was subsequently seen at [facility] by Corean Bohr, NP on April 28, 2024.  At her evaluation, blood pressure was 144/68, O2 saturation 97%.  Patient is now  referred for cardiology evaluation.   Past Medical History:  Diagnosis Date   Carpal tunnel syndrome    bilateral, had shots in each one and haven't had any problems since.   Diabetes mellitus    Hyperlipidemia    Hypertension    Parkinson's disease (HCC) 2021   Prostate cancer (HCC)    Spinal stenosis     Past Surgical History:  Procedure Laterality Date   HERNIA REPAIR     HERNIA REPAIR     INGUINAL HERNIA REPAIR     KNEE SURGERY  right   KNEE SURGERY Left    PROSTATE BIOPSY     RADIOACTIVE SEED IMPLANT N/A 09/07/2018   Procedure: RADIOACTIVE SEED IMPLANT/BRACHYTHERAPY IMPLANT;  Surgeon: Nieves Cough, MD;  Location: [facility];  Service: Urology;  Laterality: N/A;   SPACE OAR INSTILLATION N/A 09/07/2018   Procedure: SPACE OAR INSTILLATION;  Surgeon: Nieves Cough, MD;  Location: [facility];  Service: Urology;  Laterality: N/A;    Current Medications: Outpatient Medications Prior to Visit  Medication Sig Dispense Refill   alfuzosin  (UROXATRAL ) 10 MG 24 hr tablet Take 10 mg by mouth daily with breakfast.     atorvastatin  (LIPITOR) 10 MG tablet Take 10 mg by mouth at bedtime.     Blood Glucose Monitoring Suppl (ONETOUCH VERIO FLEX SYSTEM) w/Device KIT To check blood sugars three times a day for 90 days     carbidopa -levodopa  (SINEMET  IR) 25-100 MG  tablet Take 1 tablet by mouth 4 (four) times daily. For example, Take 1 pill sinemet  8am, noon, 4pm, then 8pm, do not take with protein 360 tablet 4   carvedilol  (COREG ) 3.125 MG tablet Take 3.125 mg by mouth 2 (two) times daily with a meal.     Cyanocobalamin  (VITAMIN B-12 PO) Take 2,500 mcg by mouth daily.     donepezil  (ARICEPT ) 5 MG tablet TAKE 1 TABLET BY MOUTH EVERYDAY AT BEDTIME 90 tablet 2   Glucose Blood (BLOOD GLUCOSE TEST STRIPS 333) STRP To ckeck blood sugars In Vitro three times daily for 30 days     HYDROcodone -acetaminophen  (NORCO/VICODIN) 5-325 MG tablet Take 1 tablet by mouth  every 6 (six) hours as needed (for pain). 15 tablet 0   Lancets Misc. (ACCU-CHEK SOFTCLIX LANCET DEV) KIT LANCING DEVICE TO USE DAILY TO THREE TIMES DAILY TO CHECK BLOOD SUGARS FOR E11.69 30 DAYS     losartan  (COZAAR ) 100 MG tablet Take 100 mg by mouth daily.     Multiple Vitamins-Minerals (ONE-A-DAY 50 PLUS PO) Take 1 tablet by mouth daily with breakfast.     ONETOUCH DELICA LANCETS FINE MISC      pantoprazole  (PROTONIX ) 40 MG tablet Take 1 tablet (40 mg total) by mouth daily. 30 tablet 0   TYLENOL  500 MG tablet Take 500 mg by mouth 2 (two) times daily as needed for mild pain (pain score 1-3) or headache.     No facility-administered medications prior to visit.     Allergies:   Sulfa antibiotics   Social History   Socioeconomic History   Marital status: Married    Spouse name: Not on file   Number of children: 2   Years of education: Not on file   Highest education level: Some college, no degree  Occupational History   Occupation: retired  Tobacco Use   Smoking status: Former    Current packs/day: 0.00    Types: Cigarettes    Quit date: 2016    Years since quitting: 9.5   Smokeless tobacco: Never  Vaping Use   Vaping status: Never Used  Substance and Sexual Activity   Alcohol  use: Never   Drug use: Never   Sexual activity: Not Currently    Comment: 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 Medications have been unsuccessful.  Other Topics Concern   Not on file  Social History Narrative   Resides in [place] with wife. Has two grown sons and three grandchildren.       Lives at home with wife   Right handed   Caffeine: 2 cups coffee/day   Social Drivers of Corporate investment banker Strain: Not on file  Food Insecurity: No Food Insecurity (09/11/2023)   Hunger Vital Sign    Worried About Running Out of Food in the Last Year: Never true    Ran Out of Food in the Last Year: Never true  Transportation Needs: No Transportation Needs (09/11/2023)   PRAPARE - Scientist, research (physical sciences) (Medical): No    Lack of Transportation (Non-Medical): No  Physical Activity: Not on file  Stress: Not on file  Social Connections: Not on file    Socially he was born in [place].  He previously had worked for [employer] and retired at age 34.  He is married and has 2 children and 3 grandchildren.  There is remote tobacco history of 20 years and quit smoking 2006  Family History:  The patient's family history includes Breast cancer in  his mother; Heart attack in his father.  His mother died at age 70.  Father died at age 30 with heart problems.  He has 1 deceased sister at age 75 secondary to cancer.  1 sister age 11 is living.  He has 2 children ages 15 and 45.  ROS General: Negative; No fevers, chills, or night sweats;  HEENT: Negative; No changes in vision or hearing, sinus congestion, difficulty swallowing Pulmonary: Negative; No cough, wheezing, shortness of breath, hemoptysis Cardiovascular: Negative; No chest pain, presyncope, syncope, palpitations GI: Negative; No nausea, vomiting, diarrhea, or abdominal pain GU: History of prostate CA Musculoskeletal: Negative; no myalgias, joint pain, or weakness Hematologic/Oncology: Negative; no easy bruising, bleeding Endocrine: Negative; no heat/cold intolerance; no diabetes Neuro: 3-year history of Parkinson's disease Skin: Negative; No rashes or skin lesions Psychiatric: Negative; No behavioral problems, depression Sleep: Negative; No snoring, daytime sleepiness, hypersomnolence, bruxism, restless legs, hypnogognic hallucinations, no cataplexy Other comprehensive 14 point system review is negative.   PHYSICAL EXAM:   VS:  BP 124/78   Pulse (!) 58   Ht 5' 7 (1.702 m)   Wt 167 lb 6.4 oz (75.9 kg)   SpO2 96%   BMI 26.22 kg/m     Repeat blood pressure by me was 140/68 supine and 140/70 standing  Wt Readings from Last 3 Encounters:  05/05/24 167 lb 6.4 oz (75.9 kg)  02/23/24 167 lb (75.8 kg)  01/24/24  167 lb (75.8 kg)    General: Alert, oriented, no distress.  Skin: normal turgor, no rashes, warm and dry HEENT: Normocephalic, atraumatic. Pupils equal round and reactive to light; sclera anicteric; extraocular muscles intact; Nose without nasal septal hypertrophy Mouth/Parynx benign; Mallinpatti scale 3 Neck: No JVD, no carotid bruits; normal carotid upstroke Lungs: clear to ausculatation and percussion; no wheezing or rales Chest wall: without tenderness to palpitation Heart: PMI not displaced, RRR, s1 s2 normal, 1/6 systolic murmur, no diastolic murmur, no rubs, gallops, thrills, or heaves Abdomen: soft, nontender; no hepatosplenomehaly, BS+; abdominal aorta nontender and not dilated by palpation. Back: no CVA tenderness Pulses 2+ Musculoskeletal: full range of motion, normal strength, no joint deformities Extremities: no clubbing cyanosis or edema, Homan's sign negative  Neurologic: grossly nonfocal; Cranial nerves grossly wnl Psychologic: Normal mood and affect   Studies/Labs Reviewed:   EKG Interpretation Date/Time:  Thursday May 05 2024 09:29:13 EDT Ventricular Rate:  58 PR Interval:  204 QRS Duration:  82 QT Interval:  400 QTC Calculation: 392 R Axis:   16  Text Interpretation: Sinus bradycardia When compared with ECG of 09-Jul-2018 13:14, No significant change was found Confirmed by Burnard Ned ([id number]) on 05/15/2024 2:59:03 PM     Recent Labs:    Latest Ref Rng & Units 01/24/2024    7:01 PM 09/17/2023    5:16 AM 09/15/2023    4:54 AM  BMP  Glucose 70 - 99 mg/dL 854  889  887   BUN 8 - 23 mg/dL 25  48  57   Creatinine 0.61 - 1.24 mg/dL 8.30  8.19  7.92   Sodium 135 - 145 mmol/L 134  132  129   Potassium 3.5 - 5.1 mmol/L 3.6  4.1  4.0   Chloride 98 - 111 mmol/L 103  101  99   CO2 22 - 32 mmol/L 21  20  20    Calcium  8.9 - 10.3 mg/dL 89.9  8.9  8.9         Latest Ref Rng & Units 09/12/2023  5:20 AM 09/11/2023    5:33 AM 09/10/2023    7:47 PM  Hepatic  Function  Total Protein 6.5 - 8.1 g/dL   5.7   Albumin 3.5 - 5.0 g/dL 2.6  2.9  3.0   AST 15 - 41 U/L   26   ALT 0 - 44 U/L   19   Alk Phosphatase 38 - 126 U/L   34   Total Bilirubin 0.3 - 1.2 mg/dL   1.2        Latest Ref Rng & Units 01/24/2024    7:01 PM 09/15/2023    4:54 AM 09/13/2023    5:32 AM  CBC  WBC 4.0 - 10.5 K/uL 9.2  9.6  11.9   Hemoglobin 13.0 - 17.0 g/dL 87.2  88.9  89.2   Hematocrit 39.0 - 52.0 % 37.5  32.6  31.6   Platelets 150 - 400 K/uL 194  185  162    Lab Results  Component Value Date   MCV 86.6 01/24/2024   MCV 90.6 09/15/2023   MCV 91.6 09/13/2023   Lab Results  Component Value Date   TSH 1.667 09/13/2023   No results found for: HGBA1C   BNP No results found for: BNP  ProBNP No results found for: PROBNP   Lipid Panel  No results found for: CHOL, TRIG, HDL, CHOLHDL, VLDL, LDLCALC, LDLDIRECT, LABVLDL   RADIOLOGY: VAS US  LOWER EXTREMITY VENOUS (DVT) Result Date: 05/11/2024  Lower Venous DVT Study Patient Name:  Scott Wang  Date of Exam:   05/11/2024 Medical Rec #: [id number]     Accession #:    [id number] Date of Birth: 04-10-1941     Patient Gender: M Patient Age:   89 years Exam Location:  [address] Procedure:      VAS US  LOWER EXTREMITY VENOUS (DVT) Referring Phys: LINDSAY LEE --------------------------------------------------------------------------------  Indications: Edema.  Performing Technologist: Geni Lodge RVS, RCS  Examination Guidelines: A complete evaluation includes B-mode imaging, spectral Doppler, color Doppler, and power Doppler as needed of all accessible portions of each vessel. Bilateral testing is considered an integral part of a complete examination. Limited examinations for reoccurring indications may be performed as noted. The reflux portion of the exam is performed with the patient in reverse Trendelenburg.  +---------+---------------+---------+-----------+----------+--------------+ RIGHT     CompressibilityPhasicitySpontaneityPropertiesThrombus Aging +---------+---------------+---------+-----------+----------+--------------+ CFV      Full                                                        +---------+---------------+---------+-----------+----------+--------------+ SFJ      Full                                                        +---------+---------------+---------+-----------+----------+--------------+ FV Prox  Full                                                        +---------+---------------+---------+-----------+----------+--------------+ FV Mid   Full                                                        +---------+---------------+---------+-----------+----------+--------------+  FV DistalFull                                                        +---------+---------------+---------+-----------+----------+--------------+ POP      Full                                                        +---------+---------------+---------+-----------+----------+--------------+ PTV      Full                                                        +---------+---------------+---------+-----------+----------+--------------+ PERO     Full                                                        +---------+---------------+---------+-----------+----------+--------------+ GSV      Full                                                        +---------+---------------+---------+-----------+----------+--------------+   +---------+---------------+---------+-----------+----------+--------------+ LEFT     CompressibilityPhasicitySpontaneityPropertiesThrombus Aging +---------+---------------+---------+-----------+----------+--------------+ CFV      Full                                                        +---------+---------------+---------+-----------+----------+--------------+ SFJ      Full                                                         +---------+---------------+---------+-----------+----------+--------------+ FV Prox  Full                                                        +---------+---------------+---------+-----------+----------+--------------+ FV Mid   Full                                                        +---------+---------------+---------+-----------+----------+--------------+ FV DistalFull                                                        +---------+---------------+---------+-----------+----------+--------------+  POP      Full                                                        +---------+---------------+---------+-----------+----------+--------------+ PTV      Full                                                        +---------+---------------+---------+-----------+----------+--------------+ PERO     Full                                                        +---------+---------------+---------+-----------+----------+--------------+ GSV      Full                                                        +---------+---------------+---------+-----------+----------+--------------+    Findings reported to routed in epic.  Summary: RIGHT: - There is no evidence of deep vein thrombosis in the lower extremity. - There is no evidence of superficial venous thrombosis.  LEFT: - There is no evidence of deep vein thrombosis in the lower extremity. - There is no evidence of superficial venous thrombosis.  *See table(s) above for measurements and observations. Electronically signed by Gaile New MD on 05/11/2024 at 1:41:48 PM.    Final      Additional studies/ records that were reviewed today include:   I reviewed the records of [facility] health care ER evaluation on April 23, 2024.  Records of [facility] medical including Dr. Valentin and Corean Bohr, AG-NP   ASSESSMENT:    1. Syncope and collapse   2. Primary hypertension   3. Cardiac arrhythmia,  unspecified cardiac arrhythmia type   4. Hyperlipidemia, unspecified hyperlipidemia type   5. Questionable right carotid bruit   6. History of prostate cancer   7. History of Parkinson's disease   8. Gastroesophageal reflux disease without esophagitis     PLAN:  Scott Wang is a very pleasant 83 year old gentleman who is followed by Dr. Valentin at [facility] medical.  He has a history of type 2 diabetes mellitus, stage III CKD, hyperlipidemia with aortic atherosclerosis, IPF/ILD with chronic cough, remote history of prostate CA and 3 years ago developed Parkinson's disease followed by neurology.  He was recently attending an outdoor wedding in [place].  It was very hot outside and when he came in and sat down he apparently had a syncopal spell.  He denied any prodrome of chest pain.  There is report that when EMS arrived there was transient tachycardia noted up to 230 bpm.  His workup in the ER was essentially negative with normal troponin.  He received IV fluids for probable dehydration.  Creatinine was 1.55 and magnesium was low at 1.6 which was repleted.  Chest x-ray was unremarkable.  The patient denied any chest pain and ultimately signed out  AMA since he did not want to be admitted for further observation.  Patient is unaware of significant tachycardic episodes.  He does have a history of Parkinson's disease followed by Dr. Darleen for the past 3 years and he sees Dr. Nieves for urology with his remote prostate cancer.  Presently, his his blood pressure today is stable without orthostatic drop when checked by me.  His ECG is normal and shows sinus rhythm at 58 bpm with PR interval minimally prolonged at 204 ms.  QTc interval is normal at 392 ms.  Presently, I am recommending he undergo a 2D echo Doppler study to evaluate systolic and diastolic function.  I am also checking a carotid duplex imaging with his recent syncope.  I will schedule him to wear a 2-week event monitor to further  evaluate potential arrhythmic etiology.  He does have some trace ankle edema left greater than right on exam and has a mild hand tremor contributed by his Parkinson's disease.  Presently he has been on carvedilol  3.125 mg twice a day and losartan  100 mg daily for blood pressure.  He is on atorvastatin  10 mg for lipid management.  He is on carbidopa  levodopa  25/100 mg tablet 4 times a day for his Parkinson's disease and is on pantoprazole  40 mg for GERD.  I discussed with him today is my last day prior to retirement.  As result I will not be able to follow him up we will go over the results of the above studies with him.  I will transition him to the care of Dr. Jerel Croitoru   Medication Adjustments/Labs and Tests Ordered: Current medicines are reviewed at length with the patient today.  Concerns regarding medicines are outlined above.  Medication changes, Labs and Tests ordered today are listed in the Patient Instructions below. Patient Instructions  Medication Instructions:  Your physician recommends that you continue on your current medications as directed. Please refer to the Current Medication list given to you today.  *If you need a refill on your cardiac medications before your next appointment, please call your pharmacy*  Testing/Procedures: Your physician has requested that you have an echocardiogram. Echocardiography is a painless test that uses sound waves to create images of your heart. It provides your doctor with information about the size and shape of your heart and how well your heart's chambers and valves are working. This procedure takes approximately one hour. There are no restrictions for this procedure. Please do NOT wear cologne, perfume, aftershave, or lotions (deodorant is allowed). Please arrive 15 minutes prior to your appointment time.  Please note: We ask at that you not bring children with you during ultrasound (echo/ vascular) testing. Due to room size and safety  concerns, children are not allowed in the ultrasound rooms during exams. Our front office staff cannot provide observation of children in our lobby area while testing is being conducted. An adult accompanying a patient to their appointment will only be allowed in the ultrasound room at the discretion of the ultrasound technician under special circumstances. We apologize for any inconvenience.   Your physician has requested that you have a carotid duplex. This test is an ultrasound of the carotid arteries in your neck. It looks at blood flow through these arteries that supply the brain with blood. Allow one hour for this exam. There are no restrictions or special instructions. This will take place at [address], 4th floor  Please note: We ask at that you not bring children with  you during ultrasound (echo/ vascular) testing. Due to room size and safety concerns, children are not allowed in the ultrasound rooms during exams. Our front office staff cannot provide observation of children in our lobby area while testing is being conducted. An adult accompanying a patient to their appointment will only be allowed in the ultrasound room at the discretion of the ultrasound technician under special circumstances. We apologize for any inconvenience.   ZIO XT- Long Term Monitor Instructions  Your physician has requested you wear a ZIO patch monitor for 14 days.  This is a single patch monitor. [employer] supplies one patch monitor per enrollment. Additional stickers are not available. Please do not apply patch if you will be having a Nuclear Stress Test,  Echocardiogram, Cardiac CT, MRI, or Chest Xray during the period you would be wearing the  monitor. The patch cannot be worn during these tests. You cannot remove and re-apply the  ZIO XT patch monitor.  Your ZIO patch monitor will be mailed 3 day USPS to your address on file. It may take 3-5 days  to receive your monitor after you have been enrolled.   Once you have received your monitor, please review the enclosed instructions. Your monitor  has already been registered assigning a specific monitor serial # to you.  Billing and Patient Assistance Program Information  We have supplied [employer] with any of your insurance information on file for billing purposes. [employer] offers a sliding scale Patient Assistance Program for patients that do not have  insurance, or whose insurance does not completely cover the cost of the ZIO monitor.  You must apply for the Patient Assistance Program to qualify for this discounted rate.  To apply, please call [employer] at [phone], select option 4, select option 2, ask to apply for  Patient Assistance Program. Meredeth will ask your household income, and how many people  are in your household. They will quote your out-of-pocket cost based on that information.  [employer] will also be able to set up a 24-month, interest-free payment plan if needed.  Applying the monitor   Shave hair from upper left chest.  Hold abrader disc by orange tab. Rub abrader in 40 strokes over the upper left chest as  indicated in your monitor instructions.  Clean area with 4 enclosed alcohol  pads. Let dry.  Apply patch as indicated in monitor instructions. Patch will be placed under collarbone on left  side of chest with arrow pointing upward.  Rub patch adhesive wings for 2 minutes. Remove white label marked 1. Remove the white  label marked 2. Rub patch adhesive wings for 2 additional minutes.  While looking in a mirror, press and release button in center of patch. A small green light will  flash 3-4 times. This will be your only indicator that the monitor has been turned on.  Do not shower for the first 24 hours. You may shower after the first 24 hours.  Press the button if you feel a symptom. You will hear a small click. Record Date, Time and  Symptom in the Patient Logbook.  When you are ready to remove the patch, follow  instructions on the last 2 pages of Patient  Logbook. Stick patch monitor onto the last page of Patient Logbook.  Place Patient Logbook in the blue and white box. Use locking tab on box and tape box closed  securely. The blue and white box has prepaid postage on it. Please place it in the mailbox as  soon  as possible. Your physician should have your test results approximately 7 days after the  monitor has been mailed back to [facility].  Call [facility] Customer Care at [phone] if you have questions regarding  your ZIO XT patch monitor. Call them immediately if you see an orange light blinking on your  monitor.  If your monitor falls off in less than 4 days, contact our Monitor department at [phone].  If your monitor becomes loose or falls off after 4 days call [employer] at [phone] for  suggestions on securing your monitor   Follow-Up: At [facility], you and your health needs are our priority.  As part of our continuing mission to provide you with exceptional heart care, our providers are all part of one team.  This team includes your primary Cardiologist (physician) and Advanced Practice Providers or APPs (Physician Assistants and Nurse Practitioners) who all work together to provide you with the care you need, when you need it.  Your next appointment:   6-8 week(s)  Provider:   Katlyn West, NP or Dr. Jerel Balding   We recommend signing up for the patient portal called MyChart.  Sign up information is provided on this After Visit Summary.  MyChart is used to connect with patients for Virtual Visits (Telemedicine).  Patients are able to view lab/test results, encounter notes, upcoming appointments, etc.  Non-urgent messages can be sent to your provider as well.   To learn more about what you can do with MyChart, go to [email or web address].    Signed, Debby Sor, MD  05/15/2024 3:09 PM    [facility] [address]  [address], [place], [place]  [address] Phone: [phone]

## 2024-05-10 DIAGNOSIS — N1832 Chronic kidney disease, stage 3b: Secondary | ICD-10-CM | POA: Diagnosis not present

## 2024-05-10 DIAGNOSIS — M79672 Pain in left foot: Secondary | ICD-10-CM | POA: Diagnosis not present

## 2024-05-10 DIAGNOSIS — G20A1 Parkinson's disease without dyskinesia, without mention of fluctuations: Secondary | ICD-10-CM | POA: Diagnosis not present

## 2024-05-10 DIAGNOSIS — M7122 Synovial cyst of popliteal space [Baker], left knee: Secondary | ICD-10-CM | POA: Diagnosis not present

## 2024-05-10 DIAGNOSIS — I129 Hypertensive chronic kidney disease with stage 1 through stage 4 chronic kidney disease, or unspecified chronic kidney disease: Secondary | ICD-10-CM | POA: Diagnosis not present

## 2024-05-10 DIAGNOSIS — E1169 Type 2 diabetes mellitus with other specified complication: Secondary | ICD-10-CM | POA: Diagnosis not present

## 2024-05-10 DIAGNOSIS — R2681 Unsteadiness on feet: Secondary | ICD-10-CM | POA: Diagnosis not present

## 2024-05-10 DIAGNOSIS — R600 Localized edema: Secondary | ICD-10-CM | POA: Diagnosis not present

## 2024-05-11 ENCOUNTER — Ambulatory Visit (HOSPITAL_COMMUNITY)
Admission: RE | Admit: 2024-05-11 | Discharge: 2024-05-11 | Disposition: A | Discharge status: Home / Self Care | Source: Ambulatory Visit | Attending: Vascular Surgery | Admitting: Vascular Surgery

## 2024-05-11 ENCOUNTER — Other Ambulatory Visit (HOSPITAL_COMMUNITY): Payer: Self-pay | Admitting: Family Medicine

## 2024-05-11 DIAGNOSIS — R600 Localized edema: Secondary | ICD-10-CM

## 2024-05-15 ENCOUNTER — Encounter: Payer: Self-pay | Admitting: Cardiovascular Disease

## 2024-05-19 DIAGNOSIS — H903 Sensorineural hearing loss, bilateral: Secondary | ICD-10-CM | POA: Diagnosis not present

## 2024-05-19 DIAGNOSIS — H6123 Impacted cerumen, bilateral: Secondary | ICD-10-CM | POA: Diagnosis not present

## 2024-05-19 NOTE — Progress Notes (Addendum)
 Otolaryngology Clinic Note  HPI:    Cerumen Impaction (Ear cleaning)  History of Present Illness The patient is an 83 year old gentleman who presents today for cerumen disimpaction. He was supposed to get a hearing evaluation and it was discovered that he had wax. He is accompanied by his wife.  He reports that his left ear has been causing him discomfort for the past 3 months.  Patient denies any ear pain ear drainage or sudden hearing loss.  PMH/Meds/All/SocHx/FamHx/ROS:   Medical History[1]  Surgical History[2]  No family history of bleeding disorders, wound healing problems or difficulty with anesthesia.   Social History   Socioeconomic History   Marital status: Married    Spouse name: Not on file   Number of children: Not on file   Years of education: Not on file   Highest education level: Not on file  Occupational History   Not on file  Tobacco Use   Smoking status: Former   Smokeless tobacco: Never  Substance and Sexual Activity   Alcohol  use: Not Currently   Drug use: Never   Sexual activity: Not on file  Other Topics Concern   Not on file  Social History Narrative   Not on file   Social Drivers of Health   Food Insecurity: No Food Insecurity (09/11/2023)   Received from [facility]   Food vital sign    Within the past 12 months, you worried that your food would run out before you got money to buy more: Never true    Within the past 12 months, the food you bought just didn't last and you didn't have money to get more: Never true  Transportation Needs: No Transportation Needs (09/11/2023)   Received from [facility] - Transportation    Lack of Transportation (Medical): No    Lack of Transportation (Non-Medical): No  Safety: Not At Risk (04/23/2024)   Received from [facility]   Safety    Within the last year, have you been afraid of your partner or ex-partner?: No    Within the last year, have you been humiliated or  emotionally abused in other ways by your partner or ex-partner?: No    Within the last year, have you been kicked, hit, slapped, or otherwise physically hurt by your partner or ex-partner?: No    Within the last year, have you been raped or forced to have any kind of sexual activity by your partner or ex-partner?: No  Living Situation: Not on file    Current Medications[3]  A complete ROS was performed with pertinent positives/negatives noted in the HPI. The remainder of the ROS are negative.    Physical Exam:    There were no vitals taken for this visit.   General: Well developed, well nourished. No acute distress. Voice normal.  Head/Face: Normocephalic, atraumatic. No scars or lesions. No sinus tenderness. Facial nerve intact and equal bilaterally. No facial lacerations. TMJ nontender to palpation.  Eyes: Pupils are equal, round and reactive to light. Conjunctiva and lids are normal. Normal extraocular mobility.   Ears:   Right: Pinna and external meatus normal, normal ear canal skin and caliber without drainage. Impacted cerumen, cleared under otomicroscopy, once cleared tympanic membrane intact and without effusion.   Left: Pinna and external meatus normal, normal ear canal skin and caliber without drainage. Impacted cerumen, cleared under otomicroscopy, once cleared tympanic membrane intact and without effusion.  Hearing: Normal speech reception to conversational tone.  Nose: No gross deformity  or lesions. No purulent discharge. Septum midline, no turbinate hypertrophy with normal mucosa.  Mouth/Oropharynx: Lips normal, teeth and gums normal with good dentition, normal oral vestibule. Normal floor of mouth, tongue and oral mucosa, no mucosal lesions, ulcer or mass, normal tongue mobility. Uvula midline. Hard and soft palate normal with normal mobility. Surgically absent tonsils, no erythema or exudate.  Larynx: See TFL if applicable  Nasopharynx: See TFL if applicable  Neck:  Trachea midline. No masses. No crepitus. Normal thyroid gland palpation without thyromegaly or nodules palpated. Salivary glands normal to palpation without swelling, erythema or mass.   Lymphatic: No lymphadenopathy in the neck.  Respiratory: No stridor or distress.  Extremities: No edema or cyanosis. Warm and well-perfused.  Neurologic: CN II-XII intact. Alert and oriented to self, place and time.  Normal reflexes and motor skills, balance and coordination. Moving all extremities without gross abnormality.  Psychiatric:  No unusual anxiety or evidence of depression. Appropriate affect.    Independent Review of Additional Tests or Records:   Medical Records  Procedures:   Ear Cerumen Removal  Date/Time: 05/19/2024 8:45 AM  Performed by: Olam Vina Simpler, NP Authorized by: Olam Vina Simpler, NP   Procedure Details:  Impacted cerumen: Yes Location details: bilateral Procedure type: curette (suction 3-0, 5-0, 7-0)  Post Procedure Details:  Procedure findings: complete impaction removal Patient tolerance of procedure: patient tolerated the procedure without difficulty Comments: The patient was positioned and the ear was examined with the microscope. Obstructing cerumen was gently removed using suction from both ear canals. TMs fully visualized, normal and intact. Middle ears aerated. No sign of infection or cholesteatoma.     Impression & Plans:  Scott Wang is a 83 y.o. male with   1. Bilateral impacted cerumen      2. Sensorineural hearing loss, bilateral       Assessment & Plan 1. Cerumen impaction with hearing aide use and sensorineural hearing loss He was scheduled for a hearing test, but the presence of earwax interfered with the procedure. The earwax was successfully removed using a microscope. He was advised to follow up with the audiologist for the hearing test.  PROCEDURE Cerumen was removed from the ears using a microscope.   I have personally spent 30 minutes  involved in face-to-face for this visit, 20 minutes for the procedure (billed under procedure code), and 10 minutes reviewing documentation.  Electronically signed by: Olam Vina Simpler, NP 05/19/2024 8:47 AM            [1] Past Medical History: Diagnosis Date   Diabetes mellitus    (CMD)    High cholesterol    High cholesterol    Hypertension    Parkinson disease    (CMD)    Spinal stenosis   [2] Past Surgical History: Procedure Laterality Date   CATARACT EXTRACTION     Procedure: CATARACT EXTRACTION   HERNIA REPAIR     Procedure: HERNIA REPAIR   MOUTH SURGERY      Procedure: MOUTH SURGERY   TONSILLECTOMY     Procedure: TONSILLECTOMY  [3]  Current Outpatient Medications:    Accu-Chek Guide Me Glucose Mtr misc, USE TO TEST BLOOD SUGAR 3 TIMES DAILY, Disp: , Rfl:    alfuzosin  (UROXATRAL ) 10 mg Tb24 24 hour tablet, TAKE 1 TABLET BY MOUTH EVERY MORNING, Disp: , Rfl:    aspirin 81 mg EC tablet, Take  by mouth Once Daily. (Patient not taking: Reported on 03/21/2024), Disp: , Rfl:    aspirin 81  mg EC tablet, Take 1 tablet by mouth Once Daily. (Patient not taking: Reported on 03/21/2024), Disp: , Rfl:    atorvastatin  (LIPITOR) 10 mg tablet, TAKE 1 TABLET BY MOUTH ONCE A DAY, Disp: , Rfl: 3   B12-methyltetrahydrofolate-B6 1,020mcg-680mcg DFE-1.5 mg chew, Take 1 tablet by mouth Once Daily., Disp: , Rfl:    carbidopa -levodopa  (SINEMET ) 25-100 mg per tablet, PLEASE SEE ATTACHED FOR DETAILED DIRECTIONS, Disp: , Rfl:    carvediloL  (COREG ) 3.125 mg tablet, TAKE 1 TABLET BY MOUTH TWICE A DAY WITH FOOD, Disp: , Rfl: 4   cholecalciferol (VITAMIN D3) 125 mcg (5,000 unit) capsule, Take 1 capsule by mouth Once Daily. (Patient not taking: Reported on 03/21/2024), Disp: , Rfl:    clotrimazole-betamethasone  (LOTRISONE) 1-0.05 % lotion, Apply a small amount twice daily to affected area(s) for 10 days, then use as needed for itch., Disp: 30 mL, Rfl: 0   donepeziL  (ARICEPT ) 5 mg  tablet, Take 5 mg by mouth., Disp: , Rfl:    gabapentin (NEURONTIN) 300 mg capsule, gabapentin 300 mg capsule (Patient not taking: Reported on 03/21/2024), Disp: , Rfl:    hydroCHLOROthiazide (HYDRODIURIL) 12.5 mg capsule, , Disp: , Rfl:    HYDROcodone -acetaminophen  (NORCO) 5-325 mg per tablet, Take 1 tablet by mouth 4 (four) times a day before meals and nightly., Disp: , Rfl:    krill-om-3-dha-epa-phospho-ast (MegaRed Omega-3 Krill Oil) 350-90-24-50 mg cap, Take 2 capsules by mouth Once Daily., Disp: , Rfl:    levoFLOXacin (LEVAQUIN) 500 mg tablet, 1 tablet, Disp: , Rfl:    losartan  (COZAAR ) 100 mg tablet, TAKE 1 TABLET EVERY DAY, Disp: , Rfl: 3   metFORMIN (GLUCOPHAGE) 500 mg tablet, TAKE 1 TABLET TWICE A DAY, Disp: , Rfl: 3   multivit-min-ferrous fumarate (Complete Multivitamin-Mineral) 9 mg iron/15 mL liqd, Take  by mouth., Disp: , Rfl:    omega 3-dha-epa-fish oil (OMEGA 3) 1,000 mg capsule, Take 1 g by mouth Once Daily., Disp: , Rfl:    OneTouch Delica Lancets 30 gauge misc, TEST ONCE DAILY AND IF NEEDED AS DIRECTED BY PHYSICIAN, Disp: , Rfl: 11   pantoprazole  (PROTONIX ) 40 mg EC tablet, Take 40 mg by mouth daily., Disp: , Rfl:    predniSONE (DELTASONE) 20 mg tablet, 1 tablet (Patient not taking: Reported on 03/21/2024), Disp: , Rfl:    traMADoL (ULTRAM) 50 mg tablet, TAKE 1 TABLET BY MOUTH EVERY 6 HOURS (Patient not taking: Reported on 03/21/2024), Disp: , Rfl:    traMADoL (ULTRAM) 50 mg tablet, Take 50 mg by mouth. (Patient not taking: Reported on 03/21/2024), Disp: , Rfl:

## 2024-05-26 DIAGNOSIS — I10 Essential (primary) hypertension: Secondary | ICD-10-CM | POA: Diagnosis not present

## 2024-05-26 DIAGNOSIS — J8410 Pulmonary fibrosis, unspecified: Secondary | ICD-10-CM | POA: Diagnosis not present

## 2024-05-26 DIAGNOSIS — J849 Interstitial pulmonary disease, unspecified: Secondary | ICD-10-CM | POA: Diagnosis not present

## 2024-05-26 DIAGNOSIS — N1832 Chronic kidney disease, stage 3b: Secondary | ICD-10-CM | POA: Diagnosis not present

## 2024-05-26 DIAGNOSIS — E871 Hypo-osmolality and hyponatremia: Secondary | ICD-10-CM | POA: Diagnosis not present

## 2024-05-26 DIAGNOSIS — G20A1 Parkinson's disease without dyskinesia, without mention of fluctuations: Secondary | ICD-10-CM | POA: Diagnosis not present

## 2024-05-26 DIAGNOSIS — R600 Localized edema: Secondary | ICD-10-CM | POA: Diagnosis not present

## 2024-05-26 DIAGNOSIS — E1169 Type 2 diabetes mellitus with other specified complication: Secondary | ICD-10-CM | POA: Diagnosis not present

## 2024-05-26 DIAGNOSIS — E785 Hyperlipidemia, unspecified: Secondary | ICD-10-CM | POA: Diagnosis not present

## 2024-06-03 DIAGNOSIS — I499 Cardiac arrhythmia, unspecified: Secondary | ICD-10-CM | POA: Diagnosis not present

## 2024-06-03 DIAGNOSIS — R55 Syncope and collapse: Secondary | ICD-10-CM | POA: Diagnosis not present

## 2024-06-04 DIAGNOSIS — N179 Acute kidney failure, unspecified: Secondary | ICD-10-CM | POA: Diagnosis not present

## 2024-06-04 DIAGNOSIS — E222 Syndrome of inappropriate secretion of antidiuretic hormone: Secondary | ICD-10-CM | POA: Diagnosis not present

## 2024-06-04 DIAGNOSIS — A419 Sepsis, unspecified organism: Secondary | ICD-10-CM | POA: Diagnosis not present

## 2024-06-04 DIAGNOSIS — R1310 Dysphagia, unspecified: Secondary | ICD-10-CM | POA: Diagnosis not present

## 2024-06-04 DIAGNOSIS — G20C Parkinsonism, unspecified: Secondary | ICD-10-CM | POA: Diagnosis not present

## 2024-06-04 DIAGNOSIS — M6281 Muscle weakness (generalized): Secondary | ICD-10-CM | POA: Diagnosis not present

## 2024-06-04 DIAGNOSIS — R278 Other lack of coordination: Secondary | ICD-10-CM | POA: Diagnosis not present

## 2024-06-09 ENCOUNTER — Ambulatory Visit: Payer: Self-pay | Admitting: Cardiology

## 2024-06-17 ENCOUNTER — Ambulatory Visit: Admitting: Cardiology

## 2024-06-21 ENCOUNTER — Ambulatory Visit (HOSPITAL_COMMUNITY)
Admission: RE | Admit: 2024-06-21 | Discharge: 2024-06-21 | Disposition: A | Discharge status: Home / Self Care | Source: Ambulatory Visit | Attending: Cardiology | Admitting: Cardiology

## 2024-06-21 DIAGNOSIS — E785 Hyperlipidemia, unspecified: Secondary | ICD-10-CM | POA: Diagnosis not present

## 2024-06-21 DIAGNOSIS — I499 Cardiac arrhythmia, unspecified: Secondary | ICD-10-CM | POA: Diagnosis not present

## 2024-06-21 DIAGNOSIS — R55 Syncope and collapse: Secondary | ICD-10-CM | POA: Diagnosis not present

## 2024-06-21 DIAGNOSIS — I10 Essential (primary) hypertension: Secondary | ICD-10-CM | POA: Diagnosis not present

## 2024-06-21 LAB — ECHOCARDIOGRAM COMPLETE
Area-P 1/2: 3.07 cm2
S' Lateral: 2.7 cm

## 2024-06-23 ENCOUNTER — Ambulatory Visit (HOSPITAL_COMMUNITY)
Admission: RE | Admit: 2024-06-23 | Discharge: 2024-06-23 | Disposition: A | Discharge status: Home / Self Care | Source: Ambulatory Visit | Attending: Cardiovascular Disease | Admitting: Cardiovascular Disease

## 2024-06-23 DIAGNOSIS — R0989 Other specified symptoms and signs involving the circulatory and respiratory systems: Secondary | ICD-10-CM | POA: Insufficient documentation

## 2024-06-26 NOTE — Progress Notes (Unsigned)
 Cardiology Office Note    Date:  06/27/2024  ID:  Scott, Wang 03-22-1941, MRN [id number] PCP:  Valentin Skates, DO  Cardiologist:  Jerel Balding, MD  Electrophysiologist:  None   Chief Complaint: Follow up for syncope   History of Present Illness: .    Scott Wang is a 83 y.o. 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 Nieves, hyperlipidemia, 3-year history of Parkinson's disease.  Mr. [name] was evaluated in [facility] ED on 04/23/2024 in the emergency department after developing an episode of syncope.  He was attending a wedding in [place] which was outside and you recently come back inside.  It been very hot and he felt he may have gotten dehydrated.  He went to sit inside and apparently had a syncopal episode.  When EMS arrived they detected a transient abnormal rhythm with a rate of 230 raising concern for potential underlying arrhythmia and etiology of his syncopal spell.  He was taken to the emergency room.  Troponins were negative, ECG was without arrhythmia or ischemia.  He received IV fluids.  Creatinine was 1.55 and magnesium was 1.6 which was repleted.  Chest x-ray showed normal heart size and mediastinal contour.  There was no effusion or pneumothorax.  Apparently it was advised that patient be admitted overnight however he felt stable and elected to be sent home that day and signed out AMA.  Patient was referred to cardiology service and had an urgent DOD visit with Dr. Debby Sor on 05/05/2024.  When seen in office patient was doing well, blood pressure was stable without orthostatic drop when checked by Dr. Sor.  EKG was normal at sinus rhythm with fifth at 58 bpm with PR interval minimally prolonged at 204 MS.  Echocardiogram, carotid duplex and 2-week cardiac monitor were ordered.  Carotid duplex  indicated extracranial vessels were near normal with only minimal wall thickening or plaque bilaterally.  Echocardiogram on 06/21/2024 indicated LVEF of 55 to 60%, no RWMA, diastolic parameters consistent with grade 1 diastolic dysfunction, RV systolic function and size was normal, normal PASP, mitral valve was normal in structure with no evidence of mitral valve regurgitation, mild calcification of the aortic valve with no evidence of stenosis.  Cardiac monitor showed dominant rhythm was normal sinus rhythm with normal circadian variation, rare PVCs, very few and very brief episodes of nonsustained ectopic atrial tachycardia with no evidence of atrial fibrillation, overall mostly normal arrhythmia monitor with exception of a few very brief episodes of nonsustained atrial tachycardia.  Today he presents for follow-up with his wife.  He reports that he has been doing well.  He denies any chest pain, presyncope or syncope.  Patient denies any further episodes of syncope similar to what set up to the emergency room.  Patient reports that he has had some minor shortness of breath his entire life, denies any recent significant changes.  He notes that he has a history of intermittent bilateral lower extremity edema, left leg typically worse in the right.  He denies any orthopnea or PND.  Reviewed patient's echocardiogram, cardiac monitor and carotid Dopplers, all questions were answered.  Patient's wife notes that patient has problems with balance given history of Parkinson's, does not feel that he has had any recent significant changes. ROS: .   Today he denies chest pain, lower extremity edema, fatigue,  palpitations, melena, hematuria, hemoptysis, diaphoresis, weakness, presyncope, syncope, orthopnea, and PND.  All other systems are reviewed and otherwise negative. Studies Reviewed: SABRA   EKG:  EKG is not ordered today.  CV Studies: Cardiac studies reviewed are outlined and summarized above. Otherwise please see  EMR for full report. Cardiac Studies & Procedures   ______________________________________________________________________________________________     ECHOCARDIOGRAM  ECHOCARDIOGRAM COMPLETE 06/21/2024  Narrative ECHOCARDIOGRAM REPORT    Patient Name:   Scott Wang  Date of Exam: 06/21/2024 Medical Rec #:  [id number]     Height:       67.0 in Accession #:    [id number]    Weight:       167.4 lb Date of Birth:  01/01/1941     BSA:          1.875 m Patient Age:    83 years      BP:           124/78 mmHg Patient Gender: M             HR:           63 bpm. Exam Location:  [address]  Procedure: 2D Echo, Cardiac Doppler and Color Doppler (Both Spectral and Color Flow Doppler were utilized during procedure).  Indications:    R55 Syncope  History:        Patient has no prior history of Echocardiogram examinations. Signs/Symptoms:Syncope; Risk Factors:Hypertension, Diabetes and Dyslipidemia.  Sonographer:    Elsie Bohr RDCS Referring Phys: [phone] THOMAS A KELLY  IMPRESSIONS   1. Left ventricular ejection fraction, by estimation, is 55 to 60%. The left ventricle has normal function. The left ventricle has no regional wall motion abnormalities. Left ventricular diastolic parameters are consistent with Grade I diastolic dysfunction (impaired relaxation). 2. Right ventricular systolic function is normal. The right ventricular size is normal. There is normal pulmonary artery systolic pressure. The estimated right ventricular systolic pressure is 29.0 mmHg. 3. The mitral valve is normal in structure. No evidence of mitral valve regurgitation. No evidence of mitral stenosis. 4. The aortic valve is tricuspid. There is mild calcification of the aortic valve. Aortic valve regurgitation is not visualized. No aortic stenosis is present. 5. The inferior vena cava is normal in size with greater than 50% respiratory variability, suggesting right atrial pressure of 3 mmHg.  FINDINGS Left  Ventricle: Left ventricular ejection fraction, by estimation, is 55 to 60%. The left ventricle has normal function. The left ventricle has no regional wall motion abnormalities. The left ventricular internal cavity size was normal in size. There is no left ventricular hypertrophy. Left ventricular diastolic parameters are consistent with Grade I diastolic dysfunction (impaired relaxation).  Right Ventricle: The right ventricular size is normal. No increase in right ventricular wall thickness. Right ventricular systolic function is normal. There is normal pulmonary artery systolic pressure. The tricuspid regurgitant velocity is 2.55 m/s, and with an assumed right atrial pressure of 3 mmHg, the estimated right ventricular systolic pressure is 29.0 mmHg.  Left Atrium: Left atrial size was normal in size.  Right Atrium: Right atrial size was normal in size.  Pericardium: There is no evidence of pericardial effusion.  Mitral Valve: The mitral valve is normal in structure. No evidence of mitral valve regurgitation. No evidence of mitral valve stenosis.  Tricuspid Valve: The tricuspid valve is normal in structure. Tricuspid valve regurgitation is trivial.  Aortic Valve: The aortic valve is tricuspid. There is mild calcification of the aortic valve. Aortic valve  regurgitation is not visualized. No aortic stenosis is present.  Pulmonic Valve: The pulmonic valve was normal in structure. Pulmonic valve regurgitation is not visualized.  Aorta: The aortic root is normal in size and structure.  Venous: The inferior vena cava is normal in size with greater than 50% respiratory variability, suggesting right atrial pressure of 3 mmHg.  IAS/Shunts: No atrial level shunt detected by color flow Doppler.   LEFT VENTRICLE PLAX 2D LVIDd:         4.20 cm   Diastology LVIDs:         2.70 cm   LV e' medial:    6.96 cm/s LV PW:         1.00 cm   LV E/e' medial:  11.7 LV IVS:        1.00 cm   LV e' lateral:    9.25 cm/s LVOT diam:     2.00 cm   LV E/e' lateral: 8.8 LV SV:         78 LV SV Index:   42 LVOT Area:     3.14 cm   RIGHT VENTRICLE             IVC RV S prime:     13.60 cm/s  IVC diam: 1.40 cm TAPSE (M-mode): 2.0 cm RVSP:           29.0 mmHg  LEFT ATRIUM             Index        RIGHT ATRIUM           Index LA diam:        4.00 cm 2.13 cm/m   RA Pressure: 3.00 mmHg LA Vol (A2C):   36.3 ml 19.36 ml/m  RA Area:     13.70 cm LA Vol (A4C):   44.9 ml 23.95 ml/m  RA Volume:   33.80 ml  18.03 ml/m LA Biplane Vol: 43.0 ml 22.93 ml/m AORTIC VALVE LVOT Vmax:   109.00 cm/s LVOT Vmean:  73.300 cm/s LVOT VTI:    0.248 m  AORTA Ao Root diam: 3.40 cm Ao Asc diam:  3.30 cm  MITRAL VALVE                TRICUSPID VALVE MV Area (PHT): 3.07 cm     TR Peak grad:   26.0 mmHg MV Decel Time: 247 msec     TR Vmax:        255.00 cm/s MV E velocity: 81.20 cm/s   Estimated RAP:  3.00 mmHg MV A velocity: 103.00 cm/s  RVSP:           29.0 mmHg MV E/A ratio:  0.79 SHUNTS Systemic VTI:  0.25 m Systemic Diam: 2.00 cm  Dalton McleanMD Electronically signed by Ezra Kanner Signature Date/Time: 06/21/2024/4:01:27 PM    Final    MONITORS  LONG TERM MONITOR (3-14 DAYS) 06/03/2024  Narrative   The dominant rhythm was normal sinus rhythm with normal circadian variation.   There are rare premature ventricular beats.  There are very few and very brief episodes of nonsustained ectopic atrial tachycardia (maximum 11 beats).  There is no evidence of atrial fibrillation.   There are rare premature ventricular beats and there are no complex ventricular arrhythmia events.   There is no evidence of severe bradycardia, sinus pauses or high-grade AV block.   No symptom triggered recordings were submitted.  Mostly normal arrhythmia monitor with the exception of a few very brief episodes  of nonsustained atrial tachycardia.   Patch Wear Time:  13 days and 15 hours (2025-07-06T04:29:58-0400 to  2025-07-19T19:57:50-0400)  Patient had a min HR of 44 bpm, max HR of 197 bpm, and avg HR of 70 bpm. Predominant underlying rhythm was Sinus Rhythm. Slight P wave morphology changes were noted. 11 Supraventricular Tachycardia runs occurred, the run with the fastest interval lasting 11 beats with a max rate of 197 bpm, the longest lasting 7 beats with an avg rate of 94 bpm. Some episodes of Supraventricular Tachycardia may be possible Atrial Tachycardia with variable block. Isolated SVEs were rare (<1.0%), SVE Couplets were rare (<1.0%), and SVE Triplets were rare (<1.0%). Isolated VEs were rare (<1.0%), and no VE Couplets or VE Triplets were present.       ______________________________________________________________________________________________       Current Reported Medications:.    Current Meds  Medication Sig   alfuzosin  (UROXATRAL ) 10 MG 24 hr tablet Take 10 mg by mouth daily with breakfast.   atorvastatin  (LIPITOR) 10 MG tablet Take 10 mg by mouth at bedtime.   Blood Glucose Monitoring Suppl (ONETOUCH VERIO FLEX SYSTEM) w/Device KIT To check blood sugars three times a day for 90 days   carbidopa -levodopa  (SINEMET  IR) 25-100 MG tablet Take 1 tablet by mouth 4 (four) times daily. For example, Take 1 pill sinemet  8am, noon, 4pm, then 8pm, do not take with protein   carvedilol  (COREG ) 3.125 MG tablet Take 3.125 mg by mouth 2 (two) times daily with a meal.   Cyanocobalamin  (VITAMIN B-12 PO) Take 2,500 mcg by mouth daily.   donepezil  (ARICEPT ) 5 MG tablet TAKE 1 TABLET BY MOUTH EVERYDAY AT BEDTIME   Glucose Blood (BLOOD GLUCOSE TEST STRIPS 333) STRP To ckeck blood sugars In Vitro three times daily for 30 days   HYDROcodone -acetaminophen  (NORCO/VICODIN) 5-325 MG tablet Take 1 tablet by mouth every 6 (six) hours as needed (for pain).   Lancets Misc. (ACCU-CHEK SOFTCLIX LANCET DEV) KIT LANCING DEVICE TO USE DAILY TO THREE TIMES DAILY TO CHECK BLOOD SUGARS FOR E11.69 30 DAYS   losartan   (COZAAR ) 100 MG tablet Take 100 mg by mouth daily.   Multiple Vitamins-Minerals (ONE-A-DAY 50 PLUS PO) Take 1 tablet by mouth daily with breakfast.   ONETOUCH DELICA LANCETS FINE MISC    pantoprazole  (PROTONIX ) 40 MG tablet Take 1 tablet (40 mg total) by mouth daily.   TYLENOL  500 MG tablet Take 500 mg by mouth 2 (two) times daily as needed for mild pain (pain score 1-3) or headache.    Physical Exam:    VS:  BP 136/78   Pulse (!) 57   Ht 5' 7 (1.702 m)   Wt 172 lb 12.8 oz (78.4 kg)   SpO2 98%   BMI 27.06 kg/m    Wt Readings from Last 3 Encounters:  06/27/24 172 lb 12.8 oz (78.4 kg)  05/05/24 167 lb 6.4 oz (75.9 kg)  02/23/24 167 lb (75.8 kg)    GEN: Well nourished, well developed in no acute distress NECK: No JVD; No carotid bruits CARDIAC: RRR, no murmurs, rubs, gallops RESPIRATORY:  Clear to auscultation without rales, wheezing or rhonchi  ABDOMEN: Soft, non-tender, non-distended EXTREMITIES:  No edema; No acute deformity     Asessement and Plan:SABRA    Syncope: Patient with episode of syncope in 04/2024 while attending a wedding outside, he had recently come inside and had a syncopal episode.  Per notes on EMS arrival he was in an unidentified rhythm with heart rates up  to 230 bpm raising concern for arrhythmia.  He was evaluated at [facility] ED given IV fluids and elected to leave AMA as he felt well.  Echo, cardiac monitor and carotid duplex reassuring as noted above.  Today he reports that he has been doing very well, he denies any further presyncope or syncope.  Patient and his wife feel that his blood pressure likely dropped resulting in his syncopal event, they note as soon as they laid him down flat he came to.  He notes that he does occasionally have fluctuations in blood pressure related to his Parkinson's disease, reports this has been relatively stable as of late.  Patient and wife are reassured by results, he will notify the office if he has any new dizziness,  lightheadedness, presyncope or syncope. His wife notes previous problems with dehydration, he has increased his fluid intake. Reviewed ED precautions.  Reviewed orthostatic precautions.  Ectopic atrial tachycardia: Patient's cardiac monitor was mostly normal arrhythmia monitor, did note to have a few very brief episodes of nonsustained atrial tachycardia, patient was asymptomatic.  Patient denies any palpitations or feeling of abnormal heartbeats.  He denies any further presyncope or syncope, denies any dizziness or lightheadedness.  He will continue to monitor and notify the office if he starts to note palpitations.  Hypertension: Initial blood pressure 140/82, on recheck 136/78.  Given history of Parkinson's and fluctuations of blood pressure feel this is overall well-controlled, continue current antihypertensive regimen of carvedilol  3.125 mg twice daily, losartan  100 mg daily.  Parkinson's disease: Follows up with [facility].  On Sinemet .  HLD: Last lipid profile in 09/03/2024 indicated total cholesterol 150 and LDL 62.  On atorvastatin  10 mg daily.  Monitored and managed per PCP.   Disposition: F/u with Dr. Francyne in six months or sooner as needed.   Signed, Takeyla Million D Nathanael Krist, NP

## 2024-06-27 ENCOUNTER — Encounter: Payer: Self-pay | Admitting: Cardiology

## 2024-06-27 ENCOUNTER — Ambulatory Visit: Attending: Cardiology | Admitting: Cardiology

## 2024-06-27 VITALS — BP 136/78 | HR 57 | Ht 67.0 in | Wt 172.8 lb

## 2024-06-27 DIAGNOSIS — R55 Syncope and collapse: Secondary | ICD-10-CM

## 2024-06-27 DIAGNOSIS — G20A1 Parkinson's disease without dyskinesia, without mention of fluctuations: Secondary | ICD-10-CM | POA: Diagnosis not present

## 2024-06-27 DIAGNOSIS — E785 Hyperlipidemia, unspecified: Secondary | ICD-10-CM

## 2024-06-27 DIAGNOSIS — I10 Essential (primary) hypertension: Secondary | ICD-10-CM | POA: Diagnosis not present

## 2024-06-27 DIAGNOSIS — H903 Sensorineural hearing loss, bilateral: Secondary | ICD-10-CM | POA: Diagnosis not present

## 2024-06-27 NOTE — Patient Instructions (Signed)
 Medication Instructions:  No changes *If you need a refill on your cardiac medications before your next appointment, please call your pharmacy*  Lab Work: No labs  Testing/Procedures: No testing  Follow-Up: At [facility], you and your health needs are our priority.  As part of our continuing mission to provide you with exceptional heart care, our providers are all part of one team.  This team includes your primary Cardiologist (physician) and Advanced Practice Providers or APPs (Physician Assistants and Nurse Practitioners) who all work together to provide you with the care you need, when you need it.  Your next appointment:   6 month(s)  Provider:   Luana Rumple, MD    We recommend signing up for the patient portal called "MyChart".  Sign up information is provided on this After Visit Summary.  MyChart is used to connect with patients for Virtual Visits (Telemedicine).  Patients are able to view lab/test results, encounter notes, upcoming appointments, etc.  Non-urgent messages can be sent to your provider as well.   To learn more about what you can do with MyChart, go to [email or web address].

## 2024-07-05 DIAGNOSIS — G20C Parkinsonism, unspecified: Secondary | ICD-10-CM | POA: Diagnosis not present

## 2024-07-05 DIAGNOSIS — N179 Acute kidney failure, unspecified: Secondary | ICD-10-CM | POA: Diagnosis not present

## 2024-07-05 DIAGNOSIS — A419 Sepsis, unspecified organism: Secondary | ICD-10-CM | POA: Diagnosis not present

## 2024-07-05 DIAGNOSIS — R1310 Dysphagia, unspecified: Secondary | ICD-10-CM | POA: Diagnosis not present

## 2024-07-05 DIAGNOSIS — R278 Other lack of coordination: Secondary | ICD-10-CM | POA: Diagnosis not present

## 2024-07-05 DIAGNOSIS — E222 Syndrome of inappropriate secretion of antidiuretic hormone: Secondary | ICD-10-CM | POA: Diagnosis not present

## 2024-07-05 DIAGNOSIS — M6281 Muscle weakness (generalized): Secondary | ICD-10-CM | POA: Diagnosis not present

## 2024-07-12 ENCOUNTER — Ambulatory Visit: Admitting: Neurology

## 2024-07-13 ENCOUNTER — Ambulatory Visit: Admitting: Neurology

## 2024-07-13 VITALS — BP 147/73 | HR 67

## 2024-07-13 DIAGNOSIS — K117 Disturbances of salivary secretion: Secondary | ICD-10-CM

## 2024-07-13 MED ORDER — ONABOTULINUMTOXINA 100 UNITS IJ SOLR
100.0000 [IU] | Freq: Once | INTRAMUSCULAR | Status: AC
Start: 2024-07-13 — End: 2024-07-13
  Administered 2024-07-13: 100 [IU] via INTRAMUSCULAR

## 2024-07-13 NOTE — Progress Notes (Signed)
 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  He is accompanied by his wife again today.  He had his 7th Botox  injection on 04/13/2024, at which time we increased the dose back to 50 units on each side.      Today, 07/13/2024: He reports doing well, no side effects noted and feels like the injection is effective for several weeks.  Some days are better than others with regards to his drooling.     The original written informed consent for recurrent, 3 monthly intramuscular injections with botulinum toxin for this indication was obtained on 02/03/2022.  He was consented today and had no questions.  Today's consent was signed.    I have previously talked to the patient in detail about expectations, limitations, benefits as well as potential adverse effects of botulinum toxin injections. The patient understands that the side effects include (but are not limited to): Mouth dryness, dryness of eyes, speech and swallowing difficulties, respiratory depression or problems breathing, weakness of muscles including more distant muscles than the ones injected, flu-like symptoms, myalgias, injection site reactions such as redness, itching, swelling, pain, and infection.  Distant effect of toxin has been explained.   200 units of botulinum toxin type A  in the form of Botox  were reconstituted using preservative-free normal saline to a concentration of 10 units per 0.1 mL and drawn up into 1 mL tuberculin syringes. Lot number and expiration dates as below.     O/E: BP (!) 147/73 (BP Location: Left Arm, Patient Position: Sitting, Cuff Size: Normal) Comment: md aware  Pulse 67       The patient was  situated in a chair, sitting comfortably. After preparing the areas with 70% isopropyl alcohol  and using a 30 gauge 1/2 inch needle for the facial injections, a total dose of 100 units of botulinum toxin type A  in the form of Botox  was injected into the muscles and the following distribution and quantities:   #1:  50 units in the right parotid gland, 50 units in the left parotid gland.     EMG guidance was not utilized for these injections. 0 units wasted.      The patient tolerated the procedure well without immediate complications. He was advised to make a followup appointment for repeat injections in 3 months from now and encouraged to call us  with any interim questions, concerns, problems, or updates. He was in agreement and did not have any questions prior to leaving clinic today.     Previously (copied from previous notes for reference):     He had his 6th botulinum toxin injection on 01/20/24.   He had his fifth botulinum toxin injection on 02/03/2023, at which time he felt that the increased dose of 100 units total was more effective compared to before.    He had his fourth injection on 11/11/2022, at which time he reported no significant improvement from the previous injection and we increased his total dose to 100 units, 50 units in each parotid gland.     He had his 3rd  injection on 08/11/22, at which time he received 80 units total dose. He felt that the injections thus far had not helped much.      He called in the interim in late September  2023 indicating that the Botox  had not helped.      He had his 2nd Botox  injection on 05/22/2022, at which time we increased his dose to a total of 70 units with 35 units injected in each parotid gland.    He had his first injection on 02/03/2022, at which time he received 50 units of Botox  total dose divided in both parotid glands.     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

## 2024-07-21 ENCOUNTER — Other Ambulatory Visit: Payer: Self-pay | Admitting: *Deleted

## 2024-07-21 ENCOUNTER — Telehealth: Payer: Self-pay | Admitting: Physical Therapy

## 2024-07-21 DIAGNOSIS — G20A2 Parkinson's disease without dyskinesia, with fluctuations: Secondary | ICD-10-CM

## 2024-07-21 NOTE — Telephone Encounter (Signed)
 Signed referral, thank you

## 2024-07-21 NOTE — Telephone Encounter (Signed)
 Hi Dr. Buck,  Mr. Mcgaugh was last seen in PT several months ago at which time a return eval was recommended due to progressive nature of disease. Patient is scheduled for a return PT eval on 09/20/24 and requires a new referral to re-establish care. If you agree, please place an order in [email or web address] in [facility] or fax the order to [phone].   Thank you,  Louana Terrilyn Christians, PT, DPT 07/21/24 1:33 PM   [facility], [facility] [address] [place], [place]  [address] Phone:  [phone] Fax:  [phone]

## 2024-07-26 DIAGNOSIS — R0781 Pleurodynia: Secondary | ICD-10-CM | POA: Diagnosis not present

## 2024-07-26 DIAGNOSIS — I10 Essential (primary) hypertension: Secondary | ICD-10-CM | POA: Diagnosis not present

## 2024-07-26 DIAGNOSIS — W19XXXA Unspecified fall, initial encounter: Secondary | ICD-10-CM | POA: Diagnosis not present

## 2024-07-26 DIAGNOSIS — G20A1 Parkinson's disease without dyskinesia, without mention of fluctuations: Secondary | ICD-10-CM | POA: Diagnosis not present

## 2024-08-05 DIAGNOSIS — M6281 Muscle weakness (generalized): Secondary | ICD-10-CM | POA: Diagnosis not present

## 2024-08-05 DIAGNOSIS — N179 Acute kidney failure, unspecified: Secondary | ICD-10-CM | POA: Diagnosis not present

## 2024-08-05 DIAGNOSIS — G20C Parkinsonism, unspecified: Secondary | ICD-10-CM | POA: Diagnosis not present

## 2024-08-05 DIAGNOSIS — E222 Syndrome of inappropriate secretion of antidiuretic hormone: Secondary | ICD-10-CM | POA: Diagnosis not present

## 2024-08-05 DIAGNOSIS — A419 Sepsis, unspecified organism: Secondary | ICD-10-CM | POA: Diagnosis not present

## 2024-08-05 DIAGNOSIS — R1310 Dysphagia, unspecified: Secondary | ICD-10-CM | POA: Diagnosis not present

## 2024-08-05 DIAGNOSIS — R278 Other lack of coordination: Secondary | ICD-10-CM | POA: Diagnosis not present

## 2024-09-04 DIAGNOSIS — M6281 Muscle weakness (generalized): Secondary | ICD-10-CM | POA: Diagnosis not present

## 2024-09-04 DIAGNOSIS — R1310 Dysphagia, unspecified: Secondary | ICD-10-CM | POA: Diagnosis not present

## 2024-09-04 DIAGNOSIS — G20C Parkinsonism, unspecified: Secondary | ICD-10-CM | POA: Diagnosis not present

## 2024-09-04 DIAGNOSIS — A419 Sepsis, unspecified organism: Secondary | ICD-10-CM | POA: Diagnosis not present

## 2024-09-04 DIAGNOSIS — N179 Acute kidney failure, unspecified: Secondary | ICD-10-CM | POA: Diagnosis not present

## 2024-09-04 DIAGNOSIS — E222 Syndrome of inappropriate secretion of antidiuretic hormone: Secondary | ICD-10-CM | POA: Diagnosis not present

## 2024-09-04 DIAGNOSIS — R278 Other lack of coordination: Secondary | ICD-10-CM | POA: Diagnosis not present

## 2024-09-08 DIAGNOSIS — N1832 Chronic kidney disease, stage 3b: Secondary | ICD-10-CM | POA: Diagnosis not present

## 2024-09-08 DIAGNOSIS — E441 Mild protein-calorie malnutrition: Secondary | ICD-10-CM | POA: Diagnosis not present

## 2024-09-08 DIAGNOSIS — D649 Anemia, unspecified: Secondary | ICD-10-CM | POA: Diagnosis not present

## 2024-09-08 DIAGNOSIS — E1169 Type 2 diabetes mellitus with other specified complication: Secondary | ICD-10-CM | POA: Diagnosis not present

## 2024-09-08 DIAGNOSIS — I129 Hypertensive chronic kidney disease with stage 1 through stage 4 chronic kidney disease, or unspecified chronic kidney disease: Secondary | ICD-10-CM | POA: Diagnosis not present

## 2024-09-08 DIAGNOSIS — E785 Hyperlipidemia, unspecified: Secondary | ICD-10-CM | POA: Diagnosis not present

## 2024-09-15 DIAGNOSIS — G20A1 Parkinson's disease without dyskinesia, without mention of fluctuations: Secondary | ICD-10-CM | POA: Diagnosis not present

## 2024-09-15 DIAGNOSIS — N1832 Chronic kidney disease, stage 3b: Secondary | ICD-10-CM | POA: Diagnosis not present

## 2024-09-15 DIAGNOSIS — Z0000 Encounter for general adult medical examination without abnormal findings: Secondary | ICD-10-CM | POA: Diagnosis not present

## 2024-09-15 DIAGNOSIS — R82998 Other abnormal findings in urine: Secondary | ICD-10-CM | POA: Diagnosis not present

## 2024-09-15 DIAGNOSIS — Z1331 Encounter for screening for depression: Secondary | ICD-10-CM | POA: Diagnosis not present

## 2024-09-15 DIAGNOSIS — M79672 Pain in left foot: Secondary | ICD-10-CM | POA: Diagnosis not present

## 2024-09-15 DIAGNOSIS — J439 Emphysema, unspecified: Secondary | ICD-10-CM | POA: Diagnosis not present

## 2024-09-15 DIAGNOSIS — J8410 Pulmonary fibrosis, unspecified: Secondary | ICD-10-CM | POA: Diagnosis not present

## 2024-09-15 DIAGNOSIS — I10 Essential (primary) hypertension: Secondary | ICD-10-CM | POA: Diagnosis not present

## 2024-09-15 DIAGNOSIS — J849 Interstitial pulmonary disease, unspecified: Secondary | ICD-10-CM | POA: Diagnosis not present

## 2024-09-15 DIAGNOSIS — E1169 Type 2 diabetes mellitus with other specified complication: Secondary | ICD-10-CM | POA: Diagnosis not present

## 2024-09-20 ENCOUNTER — Encounter: Payer: Self-pay | Admitting: Physical Therapy

## 2024-09-20 ENCOUNTER — Other Ambulatory Visit: Payer: Self-pay

## 2024-09-20 ENCOUNTER — Ambulatory Visit: Attending: Physical Medicine and Rehabilitation | Admitting: Physical Therapy

## 2024-09-20 DIAGNOSIS — R29818 Other symptoms and signs involving the nervous system: Secondary | ICD-10-CM | POA: Diagnosis not present

## 2024-09-20 DIAGNOSIS — R2681 Unsteadiness on feet: Secondary | ICD-10-CM | POA: Diagnosis not present

## 2024-09-20 DIAGNOSIS — R2689 Other abnormalities of gait and mobility: Secondary | ICD-10-CM | POA: Diagnosis not present

## 2024-09-20 DIAGNOSIS — G20A2 Parkinson's disease without dyskinesia, with fluctuations: Secondary | ICD-10-CM | POA: Insufficient documentation

## 2024-09-20 DIAGNOSIS — M6281 Muscle weakness (generalized): Secondary | ICD-10-CM | POA: Insufficient documentation

## 2024-09-20 NOTE — Therapy (Signed)
 OUTPATIENT PHYSICAL THERAPY NEURO EVALUATION   Patient Name: Scott Wang MRN: [id number] DOB:October 03, 1941, 83 y.o., male Today's Date: 09/20/2024   PCP: Valentin Skates, DO REFERRING PROVIDER: Ines Onetha NOVAK, MD   END OF SESSION:  PT End of Session - 09/20/24 1510     Visit Number 1    Number of Visits 8    Date for Recertification  11/18/24    Authorization Type [employer]-auth submitted    PT Start Time 1021    PT Stop Time 1101    PT Time Calculation (min) 40 min    Equipment Utilized During Treatment Gait belt    Activity Tolerance Patient tolerated treatment well    Behavior During Therapy WFL for tasks assessed/performed          Past Medical History:  Diagnosis Date   Carpal tunnel syndrome    bilateral, had shots in each one and haven't had any problems since.   Diabetes mellitus    Hyperlipidemia    Hypertension    Parkinson's disease (HCC) 2021   Prostate cancer (HCC)    Spinal stenosis    Past Surgical History:  Procedure Laterality Date   HERNIA REPAIR     HERNIA REPAIR     INGUINAL HERNIA REPAIR     KNEE SURGERY  right   KNEE SURGERY Left    PROSTATE BIOPSY     RADIOACTIVE SEED IMPLANT N/A 09/07/2018   Procedure: RADIOACTIVE SEED IMPLANT/BRACHYTHERAPY IMPLANT;  Surgeon: Nieves Cough, MD;  Location: [facility];  Service: Urology;  Laterality: N/A;   SPACE OAR INSTILLATION N/A 09/07/2018   Procedure: SPACE OAR INSTILLATION;  Surgeon: Nieves Cough, MD;  Location: [facility];  Service: Urology;  Laterality: N/A;   Patient Active Problem List   Diagnosis Date Noted   AKI (acute kidney injury) 09/10/2023   Orthostatic hypotension 11/20/2021   not drinking enough fluids 11/20/2021   Imbalance 11/20/2021   Memory loss 11/20/2021   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   Muscle stiffness due to parkinson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;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   Parkinson's  disease (HCC) 12/02/2020   Malignant neoplasm of prostate (HCC) 05/05/2018   Hypertension 06/10/2011   Hyperlipidemia 06/10/2011   Diabetes mellitus (HCC) 06/10/2011   Onychomycosis 06/10/2011   Paronychia 06/10/2011    ONSET DATE: MD referral 07/21/2024  REFERRING DIAG: G20.A2 (ICD-10-CM) - Parkinson disease without dyskinesia, with fluctuating manifestations (HCC)   THERAPY DIAG:  Unsteadiness on feet  Other abnormalities of gait and mobility  Muscle weakness (generalized)  Other symptoms and signs involving the nervous system  Rationale for Evaluation and Treatment: Rehabilitation  SUBJECTIVE:  SUBJECTIVE STATEMENT: Did pretty good until mid-June, when I had a fall backwards and then I passed out later that day.  Went to ED and had heart arrythmia and have had no additional problems with that.  Had 2 falls in August and bruised my right side.   Pt accompanied by: self  PERTINENT HISTORY: BLE pain, severe spinal stenosis L4-5, L3-4, Parkinson's, R hip pain, BLE lymphedema   PAIN:  Are you having pain? No  PRECAUTIONS: Fall  RED FLAGS: None   WEIGHT BEARING RESTRICTIONS: No  FALLS: Has patient fallen in last 6 months? Yes. Number of falls 3  LIVING ENVIRONMENT: Lives with: lives with their family Lives in: House/apartment Stairs: 3 steps to enter Has following equipment at home: Single point cane, [employer] - 2 wheeled, and [employer] - 4 wheeled Has walking poles PLOF: Independent Enjoys working in the yard and has started back to exercises from HEP  PATIENT GOALS: To get back where I was when I left last time; to work on backing up  OBJECTIVE:  Note: Objective measures were completed at Evaluation unless otherwise noted.  DIAGNOSTIC FINDINGS: NA for this  episode  COGNITION: Overall cognitive status: Within functional limits for tasks assessed   SENSATION: Light touch: WFL  COORDINATION: Slowed alternating taps  EDEMA:  Mild +1 pitting edema BLE, MD is aware  POSTURE: rounded shoulders and forward head  LOWER EXTREMITY ROM:     Active  Right Eval Left Eval  Hip flexion    Hip extension    Hip abduction    Hip adduction    Hip internal rotation    Hip external rotation    Knee flexion    Knee extension -10 -10  Ankle dorsiflexion +10 +10  Ankle plantarflexion    Ankle inversion    Ankle eversion     (Blank rows = not tested)  LOWER EXTREMITY MMT:    MMT Right Eval Left Eval  Hip flexion 5 4+  Hip extension    Hip abduction 4 4  Hip adduction 5 5  Hip internal rotation    Hip external rotation    Knee flexion 4 4  Knee extension 4 4  Ankle dorsiflexion 4 4  Ankle plantarflexion    Ankle inversion    Ankle eversion    (Blank rows = not tested)  BED MOBILITY:  Not tested  TRANSFERS: Sit to stand: Modified independence  Assistive device utilized: None     Stand to sit: Modified independence  Assistive device utilized: None      GAIT: Findings: Gait Characteristics: step through pattern, decreased arm swing- Right, decreased arm swing- Left, decreased step length- Right, decreased step length- Left, decreased ankle dorsiflexion- Right, decreased ankle dorsiflexion- Left, and narrow BOS, Distance walked: 50 ft, Assistive device utilized:None, and Level of assistance: Modified independence and SBA  FUNCTIONAL TESTS:  5 times sit to stand: 18.34 sec with 1 hesitation having to sit back down *Compared to 14.94 sec Timed up and go (TUG): 14.87 sec *compared to 14.31 sec 10 meter walk test: 17.41 sec = 1.89 ft/sec *compared to 2.22 ft/sec Mini-BESTest: 13/28 *compared to 19/28 3 M walk backwards:  9.79 sec, 8.75 sec with hastening (*compared to 7.59 sec) TUG cognitive:  19.19 sec (Compared to 14.31 sec) TUG  manual:  16.22 sec  *comparisons from discharge measures taken 03/31/2024             [facility] PT Assessment - 09/20/24 1048  Mini-BESTest   Sit To Stand Severe: Unable to stand up from chair without assistance, OR needs several attempts with use of hands.    Rise to Toes < 3 s.    Stand on one leg (left) Moderate: < 20 s   1 sec   Stand on one leg (right) Moderate: < 20 s   1 sec   Stand on one leg - lowest score 1    Compensatory Stepping Correction - Forward Moderate: More than one step is required to recover equilibrium    Compensatory Stepping Correction - Backward Moderate: More than one step is required to recover equilibrium    Compensatory Stepping Correction - Left Lateral Moderate: Several steps to recover equilibrium    Compensatory Stepping Correction - Right Lateral Severe:  Falls, or cannot step    Stepping Corredtion Lateral - lowest score 0    Stance - Feet together, eyes open, firm surface  Normal: 30s    Stance - Feet together, eyes closed, foam surface  Moderate: < 30s   14 sec   Incline - Eyes Closed Moderate: Stands independently < 30s OR aligns with surface   4 sec   Change in Gait Speed Moderate: Unable to change walking speed or signs of imbalance    Walk with head turns - Horizontal Normal: performs head turns with no change in gait speed and good balance    Walk with pivot turns Moderate:Turns with feet close SLOW (>4 steps) with good balance.    Step over obstacles Moderate: Steps over box but touches box OR displays cautious behavior by slowing gait.    Timed UP & GO with Dual Task Moderate: Dual Task affects either counting OR walking (>10%) when compared to the TUG without Dual Task.    Mini-BEST total score 13                                                                                                                           TREATMENT DATE: 09/20/2024    PATIENT EDUCATION: Education details: Eval results, POC; return to consistent performance of  previous HEP until we restart/update HEP Person educated: Patient Education method: Explanation Education comprehension: verbalized understanding  HOME EXERCISE PROGRAM: Previous HEP-pt has handouts at home  GOALS: Goals reviewed with patient? Yes  SHORT TERM GOALS: Target date: 10/21/2024  Pt will be independent with HEP for improved balance, strength, gait. Baseline: Goal status: INITIAL  2.  Pt will verbalize understanding of fall prevention in home environment.   Baseline:  Goal status:  INITIAL  LONG TERM GOALS: Target date: 11/18/2024  Pt will be independent with HEP for improved balance, strength, gait. Baseline:  Goal status: INITIAL  2.  Pt will improve gait velocity to at least 2.3 ft/sec for improved gait efficiency and safety.  Baseline: 1.89 ft/sec Goal status: INITIAL  3.  Pt will improve 5x sit<>stand to less than or equal to 15 sec to demonstrate improved functional strength and  transfer efficiency. Baseline: 18.34 sec Goal status: INITIAL  4.  Pt will improve MiniBESTest score to at least 19/28 to decrease fall risk.  Baseline: 13/28 Goal status: INITIAL   ASSESSMENT:  CLINICAL IMPRESSION: Patient is a 83 y.o. male who was seen today for return physical therapy evaluation and treatment for Parkinson's disease, as recommended/agreed upon at his previous PT discharge in May 2025.  He reports doing well until he had a fall posteriorly and then a subsequent passing out episode in mid-June.  Then he reports additional 2 falls in August.  He reports overall his balance is worse.  With objective measures today, he demo slowed/lower measures for FTSTS, TUG cognitive, gait velocity, 3 M gait backwards, MiniBESTest, compared to measures at discharge.  He is at fall risk per all functional measures noted above.  He presents with bradykinesia, hastening of gait, postural instability, abnormal posture, decreased balance, decreased timing and coordination of gait,  decreased strength.  He would benefit from skilled PT to address the above stated deficits to decrease fall risk and improve overall functional mobility and independence.  OBJECTIVE IMPAIRMENTS: Abnormal gait, decreased balance, decreased mobility, difficulty walking, decreased ROM, decreased strength, impaired flexibility, and postural dysfunction.   ACTIVITY LIMITATIONS: standing, transfers, and locomotion level  PARTICIPATION LIMITATIONS: shopping, community activity, and yard work  PERSONAL FACTORS: 3+ comorbidities: see above are also affecting patient's functional outcome.   REHAB POTENTIAL: Good  CLINICAL DECISION MAKING: Evolving/moderate complexity  EVALUATION COMPLEXITY: Moderate  PLAN:  PT FREQUENCY: 1x/week  PT DURATION: 8 weeks *pt may not make this frequency, as he notes wife's health issues + holidays as barriers  PLANNED INTERVENTIONS: 97750- Physical Performance Testing, 97110-Therapeutic exercises, 97530- Therapeutic activity, 97112- Neuromuscular re-education, 97535- Self Care, 02859- Manual therapy, [phone]- Gait training, Patient/Family education, Balance training, and DME instructions  PLAN FOR NEXT SESSION: Initiate HEP/review HEP from previous bout of therapy-need to address posture, balance, sit to stand, posterior balance recovery, unlevel surface balance   Bridgit Eynon W., PT 09/20/2024, 3:13 PM  [facility] [address] [place], [place] [address] Phone # [phone] Fax # [phone]   Referring diagnosis:  G20.A2 (ICD-10-CM) - Parkinson disease without dyskinesia, with fluctuating manifestations (HCC)  Treatment diagnosis (if different than referring diagnosis): R26.81, R26.89, M62.81, R29.818 Date Symptoms Began: MD referral 07/21/2024 # of Visits requested: 8  Time period for Authorization: 09/20/2024 to 11/18/2024  What was this (referring dx) caused by? []  Surgery []  Fall [x]  Ongoing  issue []  Arthritis []  Other: ____________  Laterality: []  Rt []  Lt [x]  Both  Functional Tool & Score: FUNCTIONAL TESTS:  5 times sit to stand: 18.34 sec with 1 hesitation having to sit back down *Compared to 14.94 sec Timed up and go (TUG): 14.87 sec *compared to 14.31 sec 10 meter walk test: 17.41 sec = 1.89 ft/sec *compared to 2.22 ft/sec Mini-BESTest: 13/28 *compared to 19/28 3 M walk backwards:  9.79 sec, 8.75 sec with hastening (*compared to 7.59 sec) TUG cognitive:  19.19 sec (Compared to 14.31 sec) TUG manual:  16.22 sec  Check all possible CPT codes:     See Planned Interventions listed in the Plan section of the Evaluation.     If [employer]: Choose 10 or less codes  If [employer]: Modalities are not covered  If [employer]: Check allowed ICD code combinations   If [facility] Plan or [employer]: Cognitive training not covered

## 2024-09-22 DIAGNOSIS — M5416 Radiculopathy, lumbar region: Secondary | ICD-10-CM | POA: Diagnosis not present

## 2024-09-22 DIAGNOSIS — R269 Unspecified abnormalities of gait and mobility: Secondary | ICD-10-CM | POA: Diagnosis not present

## 2024-09-22 DIAGNOSIS — M48061 Spinal stenosis, lumbar region without neurogenic claudication: Secondary | ICD-10-CM | POA: Diagnosis not present

## 2024-09-22 DIAGNOSIS — Z5181 Encounter for therapeutic drug level monitoring: Secondary | ICD-10-CM | POA: Diagnosis not present

## 2024-09-23 ENCOUNTER — Ambulatory Visit (INDEPENDENT_AMBULATORY_CARE_PROVIDER_SITE_OTHER)

## 2024-09-23 ENCOUNTER — Ambulatory Visit: Admitting: Podiatry

## 2024-09-23 ENCOUNTER — Encounter: Payer: Self-pay | Admitting: Podiatry

## 2024-09-23 DIAGNOSIS — M778 Other enthesopathies, not elsewhere classified: Secondary | ICD-10-CM

## 2024-09-23 DIAGNOSIS — M7752 Other enthesopathy of left foot: Secondary | ICD-10-CM

## 2024-09-23 DIAGNOSIS — M7989 Other specified soft tissue disorders: Secondary | ICD-10-CM | POA: Diagnosis not present

## 2024-09-23 MED ORDER — TRIAMCINOLONE ACETONIDE 10 MG/ML IJ SUSP
10.0000 mg | Freq: Once | INTRAMUSCULAR | Status: AC
  Administered 2024-09-23: 10 mg via INTRAMUSCULAR

## 2024-09-23 NOTE — Patient Instructions (Signed)

## 2024-09-26 ENCOUNTER — Telehealth: Payer: Self-pay | Admitting: Neurology

## 2024-09-26 DIAGNOSIS — K117 Disturbances of salivary secretion: Secondary | ICD-10-CM

## 2024-09-26 MED ORDER — ONABOTULINUMTOXINA 100 UNITS IJ SOLR: 100.0000 [IU] | INTRAMUSCULAR | 0 refills | Status: AC

## 2024-09-26 NOTE — Telephone Encounter (Signed)
 Auth was approved, please send rx to [facility].  Auth#: [id number] (09/26/24-11/09/25)

## 2024-09-26 NOTE — Telephone Encounter (Signed)
 Rx sent in

## 2024-09-26 NOTE — Addendum Note (Signed)
 Addended by: JOSHUA MAURILIO CROME on: 09/26/2024 11:29 AM   Modules accepted: Orders

## 2024-09-26 NOTE — Progress Notes (Signed)
  Subjective:  Patient ID: Scott Wang, male    DOB: 14-Feb-1941,  MRN: [id number]  Chief Complaint  Patient presents with   Foot Pain    Left foot swelling x 11 months. 5 pain. Non diabetic. 0 treatment.    Discussed the use of AI scribe software for clinical note transcription with the patient, who gave verbal consent to proceed.  History of Present Illness Scott Wang is an 83 year old male with Parkinson's disease who presents with persistent swelling and pain on the top of his left foot.  Swelling on the top of the left foot has been persistent since January, initially without pain, but pain has developed over time. There is no history of injury to the foot. An ultrasound on July 2nd ruled out blood clots, and previous tests showed no issues with blood flow.  He has prediabetes and monitors his blood sugar at home, though not frequently. He is not on blood thinners. A significant burn on his leg from a lawnmower incident a couple of years ago required medical attention. He is allergic to sulfa drugs. No other systemic symptoms are present.      Objective:  There were no vitals filed for this visit.  Physical Exam General: AAO x3, NAD  Dermatological: Skin is warm, dry and supple bilateral. There are no open sores, no preulcerative lesions, no rash or signs of infection present.  Vascular: Dorsalis Pedis artery and Posterior Tibial artery pedal pulses are 2/4 bilateral with immedate capillary fill time. There is no pain with calf compression, warmth, erythema.   Neruologic: Grossly intact via light touch bilateral.   Musculoskeletal: Edema present bilaterally left side worse than the right.  He does get tenderness palpation.  The Lisfranc joint dorsally as well as dorsal lateral on the foot.  There is no specific area of pinpoint tenderness.  There is no erythema or warmth.  Ankle range of motion intact.     No images are attached to the encounter.     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  No evidence of acute fracture.     Assessment:   1. Capsulitis of left foot   2. Leg swelling      Plan:  Patient was evaluated and treated and all questions answered.  Assessment and Plan Assessment & Plan Left foot swelling and pain due to Scott Wang joint inflammation Chronic swelling and pain on the dorsum of the left foot since January.  -Discussed medication options.  He wants to proceed with a steroid injection.  Verbal consent obtained.  I cleaned the skin with alcohol .  Mixture 1 cc Kenalog 10, 0.5 cc of Marcaine plain, 0.5 cc lidocaine  plain was infiltrated to the area maximal tenderness on the Lisfranc joint any complications.  Postinjection care discussed.  Toller well. - Prescribed Juxtalites compression socks.  He is not able to get on regular compression socks. - Advised use of topical treatments: Voltaren gel, Salonpas patches, Biofreeze. - Instructed to ice the foot post-injection. - Schedule follow-up in a couple of months.    No follow-ups on file.   Scott Wang DPM

## 2024-09-26 NOTE — Telephone Encounter (Signed)
 Submitted auth request to switch to [facility], status is pending. Key: [id number]

## 2024-09-29 ENCOUNTER — Ambulatory Visit: Admitting: Adult Health

## 2024-09-29 DIAGNOSIS — N401 Enlarged prostate with lower urinary tract symptoms: Secondary | ICD-10-CM | POA: Diagnosis not present

## 2024-09-29 DIAGNOSIS — D4101 Neoplasm of uncertain behavior of right kidney: Secondary | ICD-10-CM | POA: Diagnosis not present

## 2024-09-29 DIAGNOSIS — R3915 Urgency of urination: Secondary | ICD-10-CM | POA: Diagnosis not present

## 2024-10-05 DIAGNOSIS — M6281 Muscle weakness (generalized): Secondary | ICD-10-CM | POA: Diagnosis not present

## 2024-10-05 DIAGNOSIS — E222 Syndrome of inappropriate secretion of antidiuretic hormone: Secondary | ICD-10-CM | POA: Diagnosis not present

## 2024-10-05 DIAGNOSIS — G20C Parkinsonism, unspecified: Secondary | ICD-10-CM | POA: Diagnosis not present

## 2024-10-05 DIAGNOSIS — A419 Sepsis, unspecified organism: Secondary | ICD-10-CM | POA: Diagnosis not present

## 2024-10-05 DIAGNOSIS — R278 Other lack of coordination: Secondary | ICD-10-CM | POA: Diagnosis not present

## 2024-10-05 DIAGNOSIS — R1310 Dysphagia, unspecified: Secondary | ICD-10-CM | POA: Diagnosis not present

## 2024-10-05 DIAGNOSIS — N179 Acute kidney failure, unspecified: Secondary | ICD-10-CM | POA: Diagnosis not present

## 2024-10-09 ENCOUNTER — Other Ambulatory Visit: Payer: Self-pay | Admitting: Neurology

## 2024-10-11 ENCOUNTER — Telehealth: Payer: Self-pay | Admitting: Adult Health

## 2024-10-11 NOTE — Telephone Encounter (Signed)
 LVM asking pt to call in consent to [facility].

## 2024-10-11 NOTE — Telephone Encounter (Signed)
 Pt called to Cancel appt , Pt states   appt not needed  Appt canceled

## 2024-10-12 ENCOUNTER — Ambulatory Visit: Admitting: Physical Therapy

## 2024-10-12 ENCOUNTER — Encounter: Payer: Self-pay | Admitting: Physical Therapy

## 2024-10-12 DIAGNOSIS — M6281 Muscle weakness (generalized): Secondary | ICD-10-CM | POA: Insufficient documentation

## 2024-10-12 DIAGNOSIS — R2681 Unsteadiness on feet: Secondary | ICD-10-CM | POA: Diagnosis present

## 2024-10-12 DIAGNOSIS — R2689 Other abnormalities of gait and mobility: Secondary | ICD-10-CM | POA: Diagnosis present

## 2024-10-12 DIAGNOSIS — R29818 Other symptoms and signs involving the nervous system: Secondary | ICD-10-CM | POA: Diagnosis present

## 2024-10-12 NOTE — Therapy (Signed)
 OUTPATIENT PHYSICAL THERAPY NEURO TREATMENT  NOTE   Patient Name: Scott Wang MRN: [id number] DOB:June 24, 1941, 83 y.o., male Today's Date: 10/12/2024   PCP: Valentin Skates, DO REFERRING PROVIDER: Ines Onetha NOVAK, MD   END OF SESSION:  PT End of Session - 10/12/24 0847     Visit Number 2    Number of Visits 8    Date for Recertification  11/18/24    Authorization Type [employer]    Authorization Time Period 09/20/24-11/18/24    Authorization - Visit Number 2    Authorization - Number of Visits 8    PT Start Time 0850    PT Stop Time 0930    PT Time Calculation (min) 40 min    Equipment Utilized During Treatment Gait belt    Activity Tolerance Patient tolerated treatment well    Behavior During Therapy WFL for tasks assessed/performed           Past Medical History:  Diagnosis Date   Carpal tunnel syndrome    bilateral, had shots in each one and haven't had any problems since.   Diabetes mellitus    Hyperlipidemia    Hypertension    Parkinson's disease (HCC) 2021   Prostate cancer (HCC)    Spinal stenosis    Past Surgical History:  Procedure Laterality Date   HERNIA REPAIR     HERNIA REPAIR     INGUINAL HERNIA REPAIR     KNEE SURGERY  right   KNEE SURGERY Left    PROSTATE BIOPSY     RADIOACTIVE SEED IMPLANT N/A 09/07/2018   Procedure: RADIOACTIVE SEED IMPLANT/BRACHYTHERAPY IMPLANT;  Surgeon: Nieves Cough, MD;  Location: [facility];  Service: Urology;  Laterality: N/A;   SPACE OAR INSTILLATION N/A 09/07/2018   Procedure: SPACE OAR INSTILLATION;  Surgeon: Nieves Cough, MD;  Location: [facility];  Service: Urology;  Laterality: N/A;   Patient Active Problem List   Diagnosis Date Noted   AKI (acute kidney injury) 09/10/2023   Orthostatic hypotension 11/20/2021   not drinking enough fluids 11/20/2021   Imbalance 11/20/2021   Memory loss 11/20/2021   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   Muscle stiffness  due to parkinson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;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   Parkinson's disease (HCC) 12/02/2020   Malignant neoplasm of prostate (HCC) 05/05/2018   Hypertension 06/10/2011   Hyperlipidemia 06/10/2011   Diabetes mellitus (HCC) 06/10/2011   Onychomycosis 06/10/2011   Paronychia 06/10/2011    ONSET DATE: MD referral 07/21/2024  REFERRING DIAG: G20.A2 (ICD-10-CM) - Parkinson disease without dyskinesia, with fluctuating manifestations (HCC)   THERAPY DIAG:  Unsteadiness on feet  Other abnormalities of gait and mobility  Muscle weakness (generalized)  Rationale for Evaluation and Treatment: Rehabilitation  SUBJECTIVE:  SUBJECTIVE STATEMENT: Doing terrible.  More bad days.  When I back up, I can't stop.  I had two falls, both backing up Pt accompanied by: self  PERTINENT HISTORY: BLE pain, severe spinal stenosis L4-5, L3-4, Parkinson's, R hip pain, BLE lymphedema   PAIN:  Are you having pain? No  PRECAUTIONS: Fall  RED FLAGS: None   WEIGHT BEARING RESTRICTIONS: No  FALLS: Has patient fallen in last 6 months? Yes. Number of falls 3  LIVING ENVIRONMENT: Lives with: lives with their family Lives in: House/apartment Stairs: 3 steps to enter Has following equipment at home: Single point cane, [employer] - 2 wheeled, and [employer] - 4 wheeled Has walking poles PLOF: Independent Enjoys working in the yard and has started back to exercises from HEP  PATIENT GOALS: To get back where I was when I left last time; to work on backing up  OBJECTIVE:   TODAY'S TREATMENT: 10/12/2024 Activity Comments  Nustep, Level 3, 4 extremities x 8 min  2 min warm up, then 30 sec intervals >90, 1 min 70-80 SPM  Sit to stand-3 x 5 reps-cues for hands on knees Cues for technique for  forward lean (use of aerobic block) then upright standing  Stagger stance rocking forward/back 10 reps Cues for hinge at hips  Alternating step taps to 8 step, BLE 10 reps Cues for deliberate lift of feet placement  Trialed U-step walker 100 ft, starts/stops/forward and backwards walking Cues for potential beneft of U step with improved balance, step length      Access Code: [email or web address] URL: [email or web address] Date: 10/12/2024 Prepared by: [facility] - Outpatient  Rehab - [facility]  Exercises - Sit to Stand  - 1 x daily - 7 x weekly - 3 sets - 5 reps - Staggered Stance Forward Backward Weight Shift with Counter Support  - 1 x daily - 7 x weekly - 2 sets - 10 reps - Alternating Step Taps with Counter Support  - 1 x daily - 7 x weekly - 2 sets - 10 reps  PATIENT EDUCATION: Education details: Importance of consistent Carbidopa /Levodopa  doses per MD orders; HEP initiated Person educated: Patient Education method: Programmer, Multimedia, Demonstration, Verbal cues, and Handouts Education comprehension: verbalized understanding, returned demonstration, and needs further education  ----------------------------- Note: Objective measures were completed at Evaluation unless otherwise noted.  DIAGNOSTIC FINDINGS: NA for this episode  COGNITION: Overall cognitive status: Within functional limits for tasks assessed   SENSATION: Light touch: WFL  COORDINATION: Slowed alternating taps  EDEMA:  Mild +1 pitting edema BLE, MD is aware  POSTURE: rounded shoulders and forward head  LOWER EXTREMITY ROM:     Active  Right Eval Left Eval  Hip flexion    Hip extension    Hip abduction    Hip adduction    Hip internal rotation    Hip external rotation    Knee flexion    Knee extension -10 -10  Ankle dorsiflexion +10 +10  Ankle plantarflexion    Ankle inversion    Ankle eversion     (Blank rows = not tested)  LOWER EXTREMITY MMT:    MMT Right Eval Left Eval  Hip  flexion 5 4+  Hip extension    Hip abduction 4 4  Hip adduction 5 5  Hip internal rotation    Hip external rotation    Knee flexion 4 4  Knee extension 4 4  Ankle dorsiflexion 4 4  Ankle plantarflexion    Ankle inversion    Ankle  eversion    (Blank rows = not tested)  BED MOBILITY:  Not tested  TRANSFERS: Sit to stand: Modified independence  Assistive device utilized: None     Stand to sit: Modified independence  Assistive device utilized: None      GAIT: Findings: Gait Characteristics: step through pattern, decreased arm swing- Right, decreased arm swing- Left, decreased step length- Right, decreased step length- Left, decreased ankle dorsiflexion- Right, decreased ankle dorsiflexion- Left, and narrow BOS, Distance walked: 50 ft, Assistive device utilized:None, and Level of assistance: Modified independence and SBA  FUNCTIONAL TESTS:  5 times sit to stand: 18.34 sec with 1 hesitation having to sit back down *Compared to 14.94 sec Timed up and go (TUG): 14.87 sec *compared to 14.31 sec 10 meter walk test: 17.41 sec = 1.89 ft/sec *compared to 2.22 ft/sec Mini-BESTest: 13/28 *compared to 19/28 3 M walk backwards:  9.79 sec, 8.75 sec with hastening (*compared to 7.59 sec) TUG cognitive:  19.19 sec (Compared to 14.31 sec) TUG manual:  16.22 sec  *comparisons from discharge measures taken 03/31/2024                                                                                                                                TREATMENT DATE: 09/20/2024    PATIENT EDUCATION: Education details: Eval results, POC; return to consistent performance of previous HEP until we restart/update HEP Person educated: Patient Education method: Explanation Education comprehension: verbalized understanding  HOME EXERCISE PROGRAM: Previous HEP-pt has handouts at home  GOALS: Goals reviewed with patient? Yes  SHORT TERM GOALS: Target date: 10/21/2024  Pt will be independent with HEP for  improved balance, strength, gait. Baseline: Goal status: INITIAL  2.  Pt will verbalize understanding of fall prevention in home environment.   Baseline:  Goal status:  INITIAL  LONG TERM GOALS: Target date: 11/18/2024  Pt will be independent with HEP for improved balance, strength, gait. Baseline:  Goal status: INITIAL  2.  Pt will improve gait velocity to at least 2.3 ft/sec for improved gait efficiency and safety.  Baseline: 1.89 ft/sec Goal status: INITIAL  3.  Pt will improve 5x sit<>stand to less than or equal to 15 sec to demonstrate improved functional strength and transfer efficiency. Baseline: 18.34 sec Goal status: INITIAL  4.  Pt will improve MiniBESTest score to at least 19/28 to decrease fall risk.  Baseline: 13/28 Goal status: INITIAL   ASSESSMENT:  CLINICAL IMPRESSION: Pt presents today with reports of 2 falls since last visit; he has not been scheduled regularly as frequency due to holidays and pt not wanting to schedule fully. Skilled PT session focused on aerobic warm up + transfer training and balance + gait training with U-step rolling walker.  Trialed U-step as means to improve safety with gait in home.  He is able to perform exercises well with cues, but then trails off with more bradykinesia and slower/smaller movement pattern, when cues removed.  Pt will continue to benefit from skilled PT towards goals for improved functional mobility and decreased fall risk.   EVAL:  Patient is a 83 y.o. male who was seen today for return physical therapy evaluation and treatment for Parkinson's disease, as recommended/agreed upon at his previous PT discharge in May 2025.  He reports doing well until he had a fall posteriorly and then a subsequent passing out episode in mid-June.  Then he reports additional 2 falls in August.  He reports overall his balance is worse.  With objective measures today, he demo slowed/lower measures for FTSTS, TUG cognitive, gait velocity, 3 M gait  backwards, MiniBESTest, compared to measures at discharge.  He is at fall risk per all functional measures noted above.  He presents with bradykinesia, hastening of gait, postural instability, abnormal posture, decreased balance, decreased timing and coordination of gait, decreased strength.  He would benefit from skilled PT to address the above stated deficits to decrease fall risk and improve overall functional mobility and independence.  OBJECTIVE IMPAIRMENTS: Abnormal gait, decreased balance, decreased mobility, difficulty walking, decreased ROM, decreased strength, impaired flexibility, and postural dysfunction.   ACTIVITY LIMITATIONS: standing, transfers, and locomotion level  PARTICIPATION LIMITATIONS: shopping, community activity, and yard work  PERSONAL FACTORS: 3+ comorbidities: see above are also affecting patient's functional outcome.   REHAB POTENTIAL: Good  CLINICAL DECISION MAKING: Evolving/moderate complexity  EVALUATION COMPLEXITY: Moderate  PLAN:  PT FREQUENCY: 1x/week  PT DURATION: 8 weeks *pt may not make this frequency, as he notes wife's health issues + holidays as barriers  PLANNED INTERVENTIONS: 97750- Physical Performance Testing, 97110-Therapeutic exercises, 97530- Therapeutic activity, [id number]- Neuromuscular re-education, 97535- Self Care, [id number]- Manual therapy, [phone]- Gait training, Patient/Family education, Balance training, and DME instructions  PLAN FOR NEXT SESSION: Review HEP;  continue to address posture, balance, sit to stand, posterior balance recovery, unlevel surface balance.  Trial again Shan STARLET GREIG LELON., PT 10/12/2024, 9:34 AM  [facility] at [facility] [address] [place], [address] [place], [place] [address] Phone # [phone] Fax # [phone]

## 2024-10-14 ENCOUNTER — Ambulatory Visit: Admitting: Adult Health

## 2024-10-19 ENCOUNTER — Ambulatory Visit: Admitting: Neurology

## 2024-11-08 ENCOUNTER — Ambulatory Visit: Admitting: Physical Therapy

## 2024-11-08 ENCOUNTER — Encounter: Payer: Self-pay | Admitting: Physical Therapy

## 2024-11-08 DIAGNOSIS — R29818 Other symptoms and signs involving the nervous system: Secondary | ICD-10-CM

## 2024-11-08 DIAGNOSIS — R2681 Unsteadiness on feet: Secondary | ICD-10-CM

## 2024-11-08 DIAGNOSIS — R2689 Other abnormalities of gait and mobility: Secondary | ICD-10-CM

## 2024-11-08 DIAGNOSIS — M6281 Muscle weakness (generalized): Secondary | ICD-10-CM

## 2024-11-08 NOTE — Therapy (Signed)
 " OUTPATIENT PHYSICAL THERAPY NEURO TREATMENT  NOTE/RECERT   Patient Name: Scott Wang MRN: [id number] DOB:1941-07-11, 83 y.o., male Today's Date: 11/08/2024   PCP: Valentin Skates, DO REFERRING PROVIDER: Ines Onetha NOVAK, MD   END OF SESSION:  PT End of Session - 11/08/24 1018     Visit Number 3    Number of Visits 15    Date for Recertification  12/23/24    Authorization Type [employer]    Authorization Time Period 09/20/24-11/18/24    Authorization - Visit Number 3    Authorization - Number of Visits 8    Progress Note Due on Visit 10    PT Start Time 1019    PT Stop Time 1057    PT Time Calculation (min) 38 min    Equipment Utilized During Treatment Gait belt    Activity Tolerance Patient tolerated treatment well    Behavior During Therapy WFL for tasks assessed/performed            Past Medical History:  Diagnosis Date   Carpal tunnel syndrome    bilateral, had shots in each one and haven't had any problems since.   Diabetes mellitus    Hyperlipidemia    Hypertension    Parkinson's disease (HCC) 2021   Prostate cancer (HCC)    Spinal stenosis    Past Surgical History:  Procedure Laterality Date   HERNIA REPAIR     HERNIA REPAIR     INGUINAL HERNIA REPAIR     KNEE SURGERY  right   KNEE SURGERY Left    PROSTATE BIOPSY     RADIOACTIVE SEED IMPLANT N/A 09/07/2018   Procedure: RADIOACTIVE SEED IMPLANT/BRACHYTHERAPY IMPLANT;  Surgeon: Nieves Cough, MD;  Location: [facility];  Service: Urology;  Laterality: N/A;   SPACE OAR INSTILLATION N/A 09/07/2018   Procedure: SPACE OAR INSTILLATION;  Surgeon: Nieves Cough, MD;  Location: [facility];  Service: Urology;  Laterality: N/A;   Patient Active Problem List   Diagnosis Date Noted   AKI (acute kidney injury) 09/10/2023   Orthostatic hypotension 11/20/2021   not drinking enough fluids 11/20/2021   Imbalance 11/20/2021   Memory loss 11/20/2021   Tremor due to  parkinson'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   Muscle stiffness due to parkinson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;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   Parkinson's disease (HCC) 12/02/2020   Malignant neoplasm of prostate (HCC) 05/05/2018   Hypertension 06/10/2011   Hyperlipidemia 06/10/2011   Diabetes mellitus (HCC) 06/10/2011   Onychomycosis 06/10/2011   Paronychia 06/10/2011    ONSET DATE: MD referral 07/21/2024  REFERRING DIAG: G20.A2 (ICD-10-CM) - Parkinson disease without dyskinesia, with fluctuating manifestations (HCC)   THERAPY DIAG:  Unsteadiness on feet  Other abnormalities of gait and mobility  Muscle weakness (generalized)  Other symptoms and signs involving the nervous system  Rationale for Evaluation and Treatment: Rehabilitation  SUBJECTIVE:  SUBJECTIVE STATEMENT: Had one fall since last visit. Pt accompanied by: self  PERTINENT HISTORY: BLE pain, severe spinal stenosis L4-5, L3-4, Parkinson's, R hip pain, BLE lymphedema   PAIN:  Are you having pain? No  PRECAUTIONS: Fall  RED FLAGS: None   WEIGHT BEARING RESTRICTIONS: No  FALLS: Has patient fallen in last 6 months? Yes. Number of falls 3  LIVING ENVIRONMENT: Lives with: lives with their family Lives in: House/apartment Stairs: 3 steps to enter Has following equipment at home: Single point cane, [employer] - 2 wheeled, and [employer] - 4 wheeled Has walking poles PLOF: Independent Enjoys working in the yard and has started back to exercises from HEP  PATIENT GOALS: To get back where I was when I left last time; to work on backing up  OBJECTIVE:    TODAY'S TREATMENT: 11/08/2024 Activity Comments  HEP review Cues for technique  Fall prevention: -work on slower turns -attention to posture, step length, foot  clearance -discussed appropriate assistive device-walking pole or possibly U-step   FTSTS:  22.78 sec  No retropulsion, hands at chest-slower than at eval  10 M walk:  13.94 sec= 2.35 ft/sec   Forward/back step and weightshift Cues for larger step length  Seated leg strengthening: -marching in place -LAQ -seated hip abduction 2#, BLE 2 x 10;bradykinesia noted last half of each set of 10  Standing hip extension 2 x 10 2#   Gait with single tall walking pole (simulating walking stick he uses at home sometimes) 80 ft x 3 reps with cues for posture reset, increased step length and foot clearance    Access Code: [email or web address] URL: [email or web address] Date: 10/12/2024 Prepared by: [facility] - Outpatient  Rehab - [facility]  Exercises - Sit to Stand  - 1 x daily - 7 x weekly - 3 sets - 5 reps - Staggered Stance Forward Backward Weight Shift with Counter Support  - 1 x daily - 7 x weekly - 2 sets - 10 reps - Alternating Step Taps with Counter Support  - 1 x daily - 7 x weekly - 2 sets - 10 reps  PATIENT EDUCATION: Education details: Review of HEP, progress towards goals/POC Person educated: Patient Education method: Programmer, Multimedia, Demonstration, Verbal cues, and Handouts Education comprehension: verbalized understanding, returned demonstration, and needs further education  ----------------------------- Note: Objective measures were completed at Evaluation unless otherwise noted.  DIAGNOSTIC FINDINGS: NA for this episode  COGNITION: Overall cognitive status: Within functional limits for tasks assessed   SENSATION: Light touch: WFL  COORDINATION: Slowed alternating taps  EDEMA:  Mild +1 pitting edema BLE, MD is aware  POSTURE: rounded shoulders and forward head  LOWER EXTREMITY ROM:     Active  Right Eval Left Eval  Hip flexion    Hip extension    Hip abduction    Hip adduction    Hip internal rotation    Hip external rotation    Knee flexion    Knee  extension -10 -10  Ankle dorsiflexion +10 +10  Ankle plantarflexion    Ankle inversion    Ankle eversion     (Blank rows = not tested)  LOWER EXTREMITY MMT:    MMT Right Eval Left Eval  Hip flexion 5 4+  Hip extension    Hip abduction 4 4  Hip adduction 5 5  Hip internal rotation    Hip external rotation    Knee flexion 4 4  Knee extension 4 4  Ankle dorsiflexion 4 4  Ankle plantarflexion  Ankle inversion    Ankle eversion    (Blank rows = not tested)  BED MOBILITY:  Not tested  TRANSFERS: Sit to stand: Modified independence  Assistive device utilized: None     Stand to sit: Modified independence  Assistive device utilized: None      GAIT: Findings: Gait Characteristics: step through pattern, decreased arm swing- Right, decreased arm swing- Left, decreased step length- Right, decreased step length- Left, decreased ankle dorsiflexion- Right, decreased ankle dorsiflexion- Left, and narrow BOS, Distance walked: 50 ft, Assistive device utilized:None, and Level of assistance: Modified independence and SBA  FUNCTIONAL TESTS:  5 times sit to stand: 18.34 sec with 1 hesitation having to sit back down *Compared to 14.94 sec Timed up and go (TUG): 14.87 sec *compared to 14.31 sec 10 meter walk test: 17.41 sec = 1.89 ft/sec *compared to 2.22 ft/sec Mini-BESTest: 13/28 *compared to 19/28 3 M walk backwards:  9.79 sec, 8.75 sec with hastening (*compared to 7.59 sec) TUG cognitive:  19.19 sec (Compared to 14.31 sec) TUG manual:  16.22 sec  *comparisons from discharge measures taken 03/31/2024                                                                                                                                TREATMENT DATE: 09/20/2024    PATIENT EDUCATION: Education details: Eval results, POC; return to consistent performance of previous HEP until we restart/update HEP Person educated: Patient Education method: Explanation Education comprehension: verbalized  understanding  HOME EXERCISE PROGRAM: Previous HEP-pt has handouts at home  GOALS: Goals reviewed with patient? Yes  SHORT TERM GOALS: Target date: 10/21/2024>UPDATED TARGET 12/09/2024  Pt will be independent with HEP for improved balance, strength, gait. Baseline: initiated, needs cues Goal status: IN PROGRESS  2.  Pt will verbalize understanding of fall prevention in home environment.   Baseline: initiated  Goal status:  IN PROGRESS  LONG TERM GOALS: Target date: 11/18/2024>UPDATED TARGET 12/23/2024  Pt will be independent with HEP for improved balance, strength, gait. Baseline:  Goal status: IN PROGRESS  2.  Pt will improve gait velocity to at least 2.3 ft/sec for improved gait efficiency and safety.  Baseline: 1.89 ft/sec; 2.35 ft/sec (reports hastening of gait at times) 11/08/2024 Goal status: IN PROGRESS  3.  Pt will improve 5x sit<>stand to less than or equal to 15 sec to demonstrate improved functional strength and transfer efficiency. Baseline: 18.34 sec; 22.78 sec ( no retropulsion), 11/08/24 Goal status: IN PROGRESS  4.  Pt will improve MiniBESTest score to at least 19/28 to decrease fall risk.  Baseline: 13/28 Goal status: IN PROGRESS   ASSESSMENT:  CLINICAL IMPRESSION: Pt presents today, having only been seen 2 visits, per patient's request, due to the holiday season.  He has had one additional fall, and continues to demo bradykinesia, decreased functional strength, decreased balance.  He reports most recent near fall was due to hastening of gait. FTSTS score of 22.78  continues to indicate decreased functional strength and increased fall risk.  Gait velocity has improved, but still at limited community ambulator level.  Initial TUG and MiniBESTest scores indicate increased fall risk.  He is interested in increasing frequency to 2x/wk for the next 4-6 weeks to address the above stated deficits for improved functional mobility and decreased fall risk.    EVAL:   Patient is a 83 y.o. male who was seen today for return physical therapy evaluation and treatment for Parkinson's disease, as recommended/agreed upon at his previous PT discharge in May 2025.  He reports doing well until he had a fall posteriorly and then a subsequent passing out episode in mid-June.  Then he reports additional 2 falls in August.  He reports overall his balance is worse.  With objective measures today, he demo slowed/lower measures for FTSTS, TUG cognitive, gait velocity, 3 M gait backwards, MiniBESTest, compared to measures at discharge.  He is at fall risk per all functional measures noted above.  He presents with bradykinesia, hastening of gait, postural instability, abnormal posture, decreased balance, decreased timing and coordination of gait, decreased strength.  He would benefit from skilled PT to address the above stated deficits to decrease fall risk and improve overall functional mobility and independence.  OBJECTIVE IMPAIRMENTS: Abnormal gait, decreased balance, decreased mobility, difficulty walking, decreased ROM, decreased strength, impaired flexibility, and postural dysfunction.   ACTIVITY LIMITATIONS: standing, transfers, and locomotion level  PARTICIPATION LIMITATIONS: shopping, community activity, and yard work  PERSONAL FACTORS: 3+ comorbidities: see above are also affecting patient's functional outcome.   REHAB POTENTIAL: Good  CLINICAL DECISION MAKING: Evolving/moderate complexity  EVALUATION COMPLEXITY: Moderate  PLAN:  PT FREQUENCY: 1x/week  PT DURATION: 8 weeks *pt may not make this frequency, as he notes wife's health issues + holidays as barriers  PLANNED INTERVENTIONS: 97750- Physical Performance Testing, 97110-Therapeutic exercises, 97530- Therapeutic activity, 97112- Neuromuscular re-education, 97535- Self Care, 02859- Manual therapy, [phone]- Gait training, Patient/Family education, Balance training, and DME instructions  PLAN FOR NEXT SESSION:  Update HEP to include strengthening;  continue to address posture, balance, sit to stand, posterior balance recovery, unlevel surface balance.  Trial again U-step/single walking stick for assistive device   Kirstein Baxley W., PT 11/08/2024, 12:23 PM  [facility] at [facility] [address] [place], [address] [place], [place] [address] Phone # [phone] Fax # [phone]      "

## 2024-11-11 ENCOUNTER — Emergency Department (HOSPITAL_COMMUNITY)

## 2024-11-11 ENCOUNTER — Other Ambulatory Visit: Payer: Self-pay

## 2024-11-11 ENCOUNTER — Encounter (HOSPITAL_COMMUNITY): Payer: Self-pay | Admitting: Internal Medicine

## 2024-11-11 ENCOUNTER — Observation Stay (HOSPITAL_COMMUNITY)
Admission: EM | Admit: 2024-11-11 | Discharge: 2024-11-15 | Disposition: A | Discharge status: Home / Self Care | Attending: Internal Medicine | Admitting: Internal Medicine

## 2024-11-11 DIAGNOSIS — N1832 Chronic kidney disease, stage 3b: Secondary | ICD-10-CM | POA: Insufficient documentation

## 2024-11-11 DIAGNOSIS — I129 Hypertensive chronic kidney disease with stage 1 through stage 4 chronic kidney disease, or unspecified chronic kidney disease: Secondary | ICD-10-CM | POA: Insufficient documentation

## 2024-11-11 DIAGNOSIS — Z87891 Personal history of nicotine dependence: Secondary | ICD-10-CM | POA: Insufficient documentation

## 2024-11-11 DIAGNOSIS — W010XXA Fall on same level from slipping, tripping and stumbling without subsequent striking against object, initial encounter: Secondary | ICD-10-CM | POA: Insufficient documentation

## 2024-11-11 DIAGNOSIS — K219 Gastro-esophageal reflux disease without esophagitis: Secondary | ICD-10-CM | POA: Diagnosis not present

## 2024-11-11 DIAGNOSIS — Z79899 Other long term (current) drug therapy: Secondary | ICD-10-CM | POA: Diagnosis not present

## 2024-11-11 DIAGNOSIS — G20A1 Parkinson's disease without dyskinesia, without mention of fluctuations: Secondary | ICD-10-CM | POA: Diagnosis not present

## 2024-11-11 DIAGNOSIS — E785 Hyperlipidemia, unspecified: Secondary | ICD-10-CM | POA: Diagnosis not present

## 2024-11-11 DIAGNOSIS — S72114A Nondisplaced fracture of greater trochanter of right femur, initial encounter for closed fracture: Principal | ICD-10-CM | POA: Insufficient documentation

## 2024-11-11 LAB — URINALYSIS, ROUTINE W REFLEX MICROSCOPIC
Bilirubin Urine: NEGATIVE
Glucose, UA: NEGATIVE mg/dL
Hgb urine dipstick: NEGATIVE
Ketones, ur: NEGATIVE mg/dL
Leukocytes,Ua: NEGATIVE
Nitrite: NEGATIVE
Protein, ur: NEGATIVE mg/dL
Specific Gravity, Urine: 1.014 (ref 1.005–1.030)
pH: 7 (ref 5.0–8.0)

## 2024-11-11 LAB — COMPREHENSIVE METABOLIC PANEL WITH GFR
ALT: <5 U/L (ref 0–44)
AST: 20 U/L (ref 15–41)
Albumin: 3.8 g/dL (ref 3.5–5.0)
Alkaline Phosphatase: 62 U/L (ref 38–126)
Anion gap: 9 (ref 5–15)
BUN: 22 mg/dL (ref 8–23)
CO2: 24 mmol/L (ref 22–32)
Calcium: 9.8 mg/dL (ref 8.9–10.3)
Chloride: 104 mmol/L (ref 98–111)
Creatinine, Ser: 1.61 mg/dL — ABNORMAL HIGH (ref 0.61–1.24)
GFR, Estimated: 42 mL/min — ABNORMAL LOW
Glucose, Bld: 122 mg/dL — ABNORMAL HIGH (ref 70–99)
Potassium: 4.2 mmol/L (ref 3.5–5.1)
Sodium: 137 mmol/L (ref 135–145)
Total Bilirubin: 0.9 mg/dL (ref 0.0–1.2)
Total Protein: 6.4 g/dL — ABNORMAL LOW (ref 6.5–8.1)

## 2024-11-11 LAB — CBC
HCT: 36.9 % — ABNORMAL LOW (ref 39.0–52.0)
Hemoglobin: 12.1 g/dL — ABNORMAL LOW (ref 13.0–17.0)
MCH: 30.2 pg (ref 26.0–34.0)
MCHC: 32.8 g/dL (ref 30.0–36.0)
MCV: 92 fL (ref 80.0–100.0)
Platelets: 178 K/uL (ref 150–400)
RBC: 4.01 MIL/uL — ABNORMAL LOW (ref 4.22–5.81)
RDW: 14.2 % (ref 11.5–15.5)
WBC: 7.1 K/uL (ref 4.0–10.5)
nRBC: 0 % (ref 0.0–0.2)

## 2024-11-11 LAB — CBG MONITORING, ED: Glucose-Capillary: 122 mg/dL — ABNORMAL HIGH (ref 70–99)

## 2024-11-11 MED ORDER — HYDROCODONE-ACETAMINOPHEN 5-325 MG PO TABS
1.0000 | ORAL_TABLET | Freq: Four times a day (QID) | ORAL | Status: DC | PRN
  Administered 2024-11-11 – 2024-11-14 (×5): 1 via ORAL
  Filled 2024-11-11 (×7): qty 1

## 2024-11-11 MED ORDER — ONDANSETRON HCL 4 MG/2ML IJ SOLN: 4.0000 mg | Freq: Four times a day (QID) | INTRAMUSCULAR | Status: DC | PRN

## 2024-11-11 MED ORDER — CARBIDOPA-LEVODOPA 25-100 MG PO TABS
1.0000 | ORAL_TABLET | Freq: Four times a day (QID) | ORAL | Status: DC
  Administered 2024-11-11 – 2024-11-15 (×15): 1 via ORAL
  Filled 2024-11-11 (×15): qty 1

## 2024-11-11 MED ORDER — ACETAMINOPHEN 325 MG PO TABS: 650.0000 mg | ORAL_TABLET | Freq: Four times a day (QID) | ORAL | Status: DC | PRN

## 2024-11-11 MED ORDER — MORPHINE SULFATE (PF) 2 MG/ML IV SOLN: 1.0000 mg | INTRAVENOUS | Status: DC | PRN

## 2024-11-11 MED ORDER — ACETAMINOPHEN 650 MG RE SUPP: 650.0000 mg | Freq: Four times a day (QID) | RECTAL | Status: DC | PRN

## 2024-11-11 MED ORDER — ENOXAPARIN SODIUM 40 MG/0.4ML IJ SOSY
40.0000 mg | PREFILLED_SYRINGE | INTRAMUSCULAR | Status: DC
  Administered 2024-11-11 – 2024-11-14 (×4): 40 mg via SUBCUTANEOUS
  Filled 2024-11-11 (×4): qty 0.4

## 2024-11-11 MED ORDER — SODIUM CHLORIDE 0.9 % IV BOLUS
1000.0000 mL | Freq: Once | INTRAVENOUS | Status: AC
  Administered 2024-11-11: 1000 mL via INTRAVENOUS

## 2024-11-11 MED ORDER — ONDANSETRON HCL 4 MG PO TABS: 4.0000 mg | ORAL_TABLET | Freq: Four times a day (QID) | ORAL | Status: DC | PRN

## 2024-11-11 MED ORDER — POLYETHYLENE GLYCOL 3350 17 G PO PACK
17.0000 g | PACK | Freq: Every day | ORAL | Status: DC | PRN
  Administered 2024-11-13: 17 g via ORAL

## 2024-11-11 MED ORDER — LOSARTAN POTASSIUM 50 MG PO TABS: 100.0000 mg | ORAL_TABLET | Freq: Every day | ORAL | Status: DC

## 2024-11-11 MED ORDER — ATORVASTATIN CALCIUM 10 MG PO TABS
10.0000 mg | ORAL_TABLET | Freq: Every day | ORAL | Status: DC
  Administered 2024-11-11 – 2024-11-14 (×4): 10 mg via ORAL
  Filled 2024-11-11 (×4): qty 1

## 2024-11-11 NOTE — Plan of Care (Signed)

## 2024-11-11 NOTE — H&P (Signed)
 " History and Physical    Scott Wang FMW:[id number] DOB: 09/24/1941 DOA: 11/11/2024  PCP: Valentin Skates, DO   Patient coming from: Home  I have personally briefly reviewed patient's old medical records in [facility]  Chief Complaint: Fall  HPI: Scott Wang is a 84 y.o. male with medical history significant for diabetes mellitus, hypertension, Parkinson's, prostate cancer. Patient presented to the ED with complaints of fall 2 days ago.  He was in the pantry trying to reach up to get something when he lost his balance and fell.  Wife was present, she called a neighbor who helped get patient into a chair.  Patient has not been able to ambulate since the fall.  At baseline he does not use any assistance or assistive devices.  He has baseline balance issues secondary to Parkinson's. Apart from left hip pain he has no other complaints.  ED Course: Stable vitals. Pelvic x-ray negative for acute abnormality, CT right hip-  Acute nondisplaced mildly distracted fracture of the greater trochanter of the right femur. EDP talked to Ortho on-call, I&D, will consult in a.m., weightbearing as tolerated, PT OT trochanteric precautions.  Review of Systems: As per HPI all other systems reviewed and negative.  Past Medical History:  Diagnosis Date   Carpal tunnel syndrome    bilateral, had shots in each one and haven't had any problems since.   Diabetes mellitus    Hyperlipidemia    Hypertension    Parkinson's disease (HCC) 2021   Prostate cancer (HCC)    Spinal stenosis     Past Surgical History:  Procedure Laterality Date   HERNIA REPAIR     HERNIA REPAIR     INGUINAL HERNIA REPAIR     KNEE SURGERY  right   KNEE SURGERY Left    PROSTATE BIOPSY     RADIOACTIVE SEED IMPLANT N/A 09/07/2018   Procedure: RADIOACTIVE SEED IMPLANT/BRACHYTHERAPY IMPLANT;  Surgeon: Nieves Cough, MD;  Location: [facility];  Service: Urology;  Laterality: N/A;   SPACE OAR INSTILLATION  N/A 09/07/2018   Procedure: SPACE OAR INSTILLATION;  Surgeon: Nieves Cough, MD;  Location: [facility];  Service: Urology;  Laterality: N/A;     reports that he quit smoking about 10 years ago. His smoking use included cigarettes. He smoked an average of 1 pack per day. He has never used smokeless tobacco. He reports that he does not drink alcohol  and does not use drugs.  Allergies[1]  Family History  Problem Relation Age of Onset   Breast cancer Mother    Heart attack Father    Prostate cancer Neg Hx    Pancreatic cancer Neg Hx    Colon cancer Neg Hx    Tremor Neg Hx    Parkinson's disease Neg Hx     Prior to Admission medications  Medication Sig Start Date End Date Taking? Authorizing Provider  alfuzosin  (UROXATRAL ) 10 MG 24 hr tablet Take 10 mg by mouth daily with breakfast.    [provider]  atorvastatin  (LIPITOR) 10 MG tablet Take 10 mg by mouth at bedtime. 01/03/17   [provider]  Blood Glucose Monitoring Suppl (ONETOUCH VERIO FLEX SYSTEM) w/Device KIT To check blood sugars three times a day for 90 days 02/04/24   [provider]  botulinum toxin Type A  (BOTOX ) 100 units SOLR injection Inject 100 Units into the muscle every 3 (three) months. 09/26/24   Athar, Saima, MD  carbidopa -levodopa  (SINEMET  IR) 25-100 MG tablet  Take 1 tablet by mouth 4 (four) times daily. For example, Take 1 pill sinemet  8am, noon, 4pm, then 8pm, do not take with protein 02/23/24   Ines Onetha NOVAK, MD  carvedilol  (COREG ) 3.125 MG tablet Take 3.125 mg by mouth 2 (two) times daily with a meal. 01/21/17   [provider]  Cyanocobalamin  (VITAMIN B-12 PO) Take 2,500 mcg by mouth daily.    [provider]  donepezil  (ARICEPT ) 5 MG tablet TAKE 1 TABLET BY MOUTH EVERYDAY AT BEDTIME 10/15/24   Dohmeier, Dedra, MD  Glucose Blood (BLOOD GLUCOSE TEST STRIPS 333) STRP To ckeck blood sugars In Vitro three times daily for 30 days 01/19/24   [provider]  HYDROcodone -acetaminophen  (NORCO/VICODIN) 5-325 MG tablet Take 1 tablet by mouth every 6 (six) hours as needed (for pain). 09/17/23   Samtani, Jai-Gurmukh, MD  Lancets Misc. (ACCU-CHEK SOFTCLIX LANCET DEV) KIT LANCING DEVICE TO USE DAILY TO THREE TIMES DAILY TO CHECK BLOOD SUGARS FOR E11.69 30 DAYS 01/19/24   [provider]  losartan (COZAAR) 100 MG tablet Take 100 mg by mouth daily.    [provider]  Multiple Vitamins-Minerals (ONE-A-DAY 50 PLUS PO) Take 1 tablet by mouth daily with breakfast.    [provider]  AISHA PASTOR LANCETS FINE MISC  01/01/17   [provider]  pantoprazole  (PROTONIX ) 40 MG tablet Take 1 tablet (40 mg total) by mouth daily. 09/18/23   Samtani, Jai-Gurmukh, MD  TYLENOL  500 MG tablet Take 500 mg by mouth 2 (two) times daily as needed for mild pain (pain score 1-3) or headache.    [provider]    Physical Exam: Vitals:   11/11/24 1140  BP: (!) 143/78  Pulse: 69  Resp: 16  Temp: 97.7 F (36.5 C)  TempSrc: Oral  SpO2: 92%    Constitutional: NAD, calm, comfortable Vitals:   11/11/24 1140  BP: (!) 143/78  Pulse: 69  Resp: 16  Temp: 97.7 F (36.5 C)  TempSrc: Oral  SpO2: 92%   Eyes: PERRL, lids and conjunctivae normal ENMT: Mucous membranes are moist  Neck: normal, supple, no masses, no thyromegaly Respiratory: clear to auscultation bilaterally, no wheezing, no crackles. Normal respiratory effort. No accessory muscle use.  Cardiovascular: Regular rate and rhythm, no murmurs / rubs / gallops. No extremity edema.  Abdomen: no tenderness, no masses palpated. No hepatosplenomegaly. Bowel sounds positive.  Musculoskeletal: no clubbing / cyanosis. No joint deformity upper and lower extremities. Skin: no rashes, lesions, ulcers. No induration Neurologic: No facial asymmetry, will extremity spontaneously, speech fluent.  Psychiatric: Normal judgment and insight. Alert and oriented x 3. Normal  mood.   Labs on Admission: I have personally reviewed following labs and imaging studies  CBC: Recent Labs  Lab 11/11/24 1228  WBC 7.1  HGB 12.1*  HCT 36.9*  MCV 92.0  PLT 178   Basic Metabolic Panel: Recent Labs  Lab 11/11/24 1228  NA 137  K 4.2  CL 104  CO2 24  GLUCOSE 122*  BUN 22  CREATININE 1.61*  CALCIUM  9.8   GFR: CrCl cannot be calculated (Unknown ideal weight.). Liver Function Tests: Recent Labs  Lab 11/11/24 1228  AST 20  ALT <5  ALKPHOS 62  BILITOT 0.9  PROT 6.4*  ALBUMIN 3.8   CBG: Recent Labs  Lab 11/11/24 1237  GLUCAP 122*   Urine analysis:    Component Value Date/Time   COLORURINE YELLOW 11/11/2024 1257   APPEARANCEUR CLEAR 11/11/2024 1257   LABSPEC 1.014  11/11/2024 1257   PHURINE 7.0 11/11/2024 1257   GLUCOSEU NEGATIVE 11/11/2024 1257   HGBUR NEGATIVE 11/11/2024 1257   BILIRUBINUR NEGATIVE 11/11/2024 1257   KETONESUR NEGATIVE 11/11/2024 1257   PROTEINUR NEGATIVE 11/11/2024 1257   NITRITE NEGATIVE 11/11/2024 1257   LEUKOCYTESUR NEGATIVE 11/11/2024 1257    Radiological Exams on Admission: CT Hip Right Wo Contrast Result Date: 11/11/2024 CLINICAL DATA:  Pain after fall. EXAM: CT OF THE RIGHT HIP WITHOUT CONTRAST TECHNIQUE: Multidetector CT imaging of the right hip was performed according to the standard protocol. Multiplanar CT image reconstructions were also generated. RADIATION DOSE REDUCTION: This exam was performed according to the departmental dose-optimization program which includes automated exposure control, adjustment of the mA and/or kV according to patient size and/or use of iterative reconstruction technique. COMPARISON:  Earlier same day radiographs. CT abdomen/pelvis dated 01/24/2024. FINDINGS: Bones/Joint/Cartilage Acute nondisplaced fracture of the greater trochanter of the right femur involving the superoposterior and lateral facets with up to 3 mm of distraction. The right femoral head is seated within the acetabulum. Mild  osteoarthritis of the right hip. Pubic symphysis is anatomically aligned. Ligaments Ligaments are suboptimally evaluated by CT. Muscles and Tendons No discrete intramuscular collection. Soft tissue Subcutaneous edema and soft tissue swelling of the right lateral hip and proximal thigh. 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  Sigmoid colonic diverticulosis. IMPRESSION: 1. Acute nondisplaced mildly distracted fracture of the greater trochanter of the right femur. 2. Subcutaneous edema and soft tissue swelling of the right lateral hip and proximal thigh. Electronically Signed   By: Harrietta Sherry M.D.   On: 11/11/2024 15:50   CT Head Wo Contrast Result Date: 11/11/2024 EXAM: CT HEAD WITHOUT CONTRAST 11/11/2024 03:00:27 PM TECHNIQUE: CT of the head was performed without the administration of intravenous contrast. Automated exposure control, iterative reconstruction, and/or weight based adjustment of the mA/kV was utilized to reduce the radiation dose to as low as reasonably achievable. COMPARISON: MRI head 10/09/2020. CLINICAL HISTORY: Polytrauma, blunt. FINDINGS: BRAIN AND VENTRICLES: No acute hemorrhage. No evidence of acute infarct. No hydrocephalus. No extra-axial collection. No mass effect or midline shift. Moderate age-related cerebral volume loss. Mild periventricular white matter disease. Moderate vascular calcifications. ORBITS: No acute abnormality. Bilateral lens replacement. SINUSES: No acute abnormality. SOFT TISSUES AND SKULL: No acute soft tissue abnormality. No skull fracture. IMPRESSION: 1. No acute intracranial abnormality. Electronically signed by: Donnice Mania MD 11/11/2024 03:38 PM EST RP Workstation: [id number]   DG Wrist Complete Right Result Date: 11/11/2024 EXAM: 3 OR MORE VIEW(S) XRAY OF THE WRIST 11/11/2024 01:09:00 PM COMPARISON: None available. CLINICAL HISTORY: fall FINDINGS: BONES AND JOINTS: No acute fracture. No malalignment. SOFT TISSUES: Vascular calcifications. IMPRESSION: 1. No acute  fracture or dislocation. Electronically signed by: Morgane Naveau MD 11/11/2024 02:10 PM EST RP Workstation: [id number]   DG Hip Unilat W or Wo Pelvis 2-3 Views Right Result Date: 11/11/2024 EXAM: 2 or more VIEW(S) XRAY OF THE BILATERAL HIP 11/11/2024 01:09:00 PM COMPARISON: None available. CLINICAL HISTORY: fall FINDINGS: BONES AND JOINTS: No acute fracture. No malalignment. Mild bilateral hip degenerative changes. LUMBAR SPINE: Degenerative changes of the visualized lower lumbar spine. SOFT TISSUES: Prostate brachytherapy seeds noted. Vascular calcifications. IMPRESSION: 1. No evidence of acute traumatic injury. Electronically signed by: Morgane Naveau MD 11/11/2024 02:10 PM EST RP Workstation: [id number]    EKG: Independently reviewed.  Sinus rhythm, rate 61, QTc 384.  No significant change from prior.  Assessment/Plan Principal Problem:   Nondisplaced fracture of greater trochanter of right femur, initial encounter for  closed fracture (HCC)  Assessment and Plan:  Mechanical fall with nondisplaced fracture of the greater trochanter of right femur-seen on CT.  Baseline balance issues to Parkinson's. - Dr. Celena with Ortho consulted,Anticipate formal consultation tomorrow.  Mobilize WBAT with PT, OT; trochanteric precautions. - Hydrocodone /acetaminophen   Parkinson's - Resume Sinemet , donepezil   Hypertension - Resume losartan   DVT prophylaxis: Lovenox  Code Status: Full Family Communication: Spouse at bedside Disposition Plan: ~ 1 - 2 days Consults called: ortho Admission status: Obs med surg    Author: Tully FORBES Carwin, MD 11/11/2024 5:54 PM  For on call review [email or web address].      [1]  Allergies Allergen Reactions   Sulfa Antibiotics Swelling and Rash   "

## 2024-11-11 NOTE — ED Provider Notes (Signed)
 " [place] EMERGENCY DEPARTMENT AT [facility] Provider Note   CSN: [id number] Arrival date & time: 11/11/24  1135     Patient presents with: Hip Pain and Fall   Scott Wang is a 84 y.o. male.   The history is provided by the patient, medical records, the EMS personnel and the spouse. No language interpreter was used.  Hip Pain  Fall     84 year old male with history of hypertension, diabetes, Parkinson disease, brought here via EMS from home for evaluation of a fall.  Patient reports 2 days ago on New Year's Eve he was in the process of putting something in the pantry and he lost balance fell on the right side of his body striking his hip against the ground.  He denies hitting his head or loss of consciousness.  He also hit his right elbow from the fall.  He was on the ground for approximately 30 minutes while waiting for his neighbor to come over to help him up.  His wife was there with him.  Once he got up to get on his easy chair he has been mostly laying his chair and have not been able to walk much or bear any weight on his right hip.  When he is not moving, states pain is minimal, increasing pain with movement.  Pain is nonradiating.  No complaint of headache neck pain chest pain abdominal pain back pain or pain to his extremities.  He is up-to-date with tetanus.  He is not on any blood thinner medication.  He denies any focal numbness or focal weakness and also denies any precipitating symptoms prior to his fall.  He has been taking some pain medication at home with relief.  He denies any knee or ankle pain.  Patient normally does not use anything to help with his walking.  He does report having trouble with his balance due to his Parkinson's disease.  Prior to Admission medications  Medication Sig Start Date End Date Taking? Authorizing Provider  alfuzosin  (UROXATRAL ) 10 MG 24 hr tablet Take 10 mg by mouth daily with breakfast.    [provider]  atorvastatin   (LIPITOR) 10 MG tablet Take 10 mg by mouth at bedtime. 01/03/17   [provider]  Blood Glucose Monitoring Suppl (ONETOUCH VERIO FLEX SYSTEM) w/Device KIT To check blood sugars three times a day for 90 days 02/04/24   [provider]  botulinum toxin Type A  (BOTOX ) 100 units SOLR injection Inject 100 Units into the muscle every 3 (three) months. 09/26/24   Athar, Saima, MD  carbidopa -levodopa  (SINEMET  IR) 25-100 MG tablet Take 1 tablet by mouth 4 (four) times daily. For example, Take 1 pill sinemet  8am, noon, 4pm, then 8pm, do not take with protein 02/23/24   Ines Onetha NOVAK, MD  carvedilol  (COREG ) 3.125 MG tablet Take 3.125 mg by mouth 2 (two) times daily with a meal. 01/21/17   [provider]  Cyanocobalamin  (VITAMIN B-12 PO) Take 2,500 mcg by mouth daily.    [provider]  donepezil  (ARICEPT ) 5 MG tablet TAKE 1 TABLET BY MOUTH EVERYDAY AT BEDTIME 10/15/24   Dohmeier, Dedra, MD  Glucose Blood (BLOOD GLUCOSE TEST STRIPS 333) STRP To ckeck blood sugars In Vitro three times daily for 30 days 01/19/24   [provider]  HYDROcodone -acetaminophen  (NORCO/VICODIN) 5-325 MG tablet Take 1 tablet by mouth every 6 (six) hours as needed (for pain). 09/17/23   Samtani, Jai-Gurmukh, MD  Lancets Misc. [facility]  LANCET DEV) KIT LANCING DEVICE TO USE DAILY TO THREE TIMES DAILY TO CHECK BLOOD SUGARS FOR E11.69 30 DAYS 01/19/24   [provider]  losartan (COZAAR) 100 MG tablet Take 100 mg by mouth daily.    [provider]  Multiple Vitamins-Minerals (ONE-A-DAY 50 PLUS PO) Take 1 tablet by mouth daily with breakfast.    [provider]  AISHA PASTOR LANCETS FINE MISC  01/01/17   [provider]  pantoprazole  (PROTONIX ) 40 MG tablet Take 1 tablet (40 mg total) by mouth daily. 09/18/23   Samtani, Jai-Gurmukh, MD  TYLENOL  500 MG tablet Take 500 mg by mouth 2 (two) times daily as needed for mild pain (pain score 1-3) or headache.     [provider]    Allergies: Sulfa antibiotics    Review of Systems  All other systems reviewed and are negative.   Updated Vital Signs BP (!) 143/78 (BP Location: Left Arm)   Pulse 69   Temp 97.7 F (36.5 C) (Oral)   Resp 16   SpO2 92%   Physical Exam Constitutional:      General: He is not in acute distress.    Appearance: He is well-developed.     Comments: Elderly male laying in bed appears in no acute discomfort.  He is mentating appropriately.  HENT:     Head: Atraumatic.  Eyes:     Conjunctiva/sclera: Conjunctivae normal.  Cardiovascular:     Rate and Rhythm: Normal rate and regular rhythm.     Pulses: Normal pulses.     Heart sounds: Normal heart sounds.  Pulmonary:     Breath sounds: Normal breath sounds.  Abdominal:     Palpations: Abdomen is soft.     Tenderness: There is no abdominal tenderness.  Musculoskeletal:        General: Tenderness (Right hip: Tenderness about the lateral aspect of the hip however patient is able to flex and extend the hip with some difficulty.  No obvious deformity noted.) present.     Cervical back: Normal range of motion and neck supple.     Comments: No midline spine tenderness.  No tenderness to right knee or right ankle.  Intact distal pedal pulse.  Right elbow: Small skin tear noted to posterior elbow without any significant tenderness.  Able to flex and extend elbow.  Skin:    Findings: No rash.  Neurological:     Mental Status: He is alert.     (all labs ordered are listed, but only abnormal results are displayed) Labs Reviewed  COMPREHENSIVE METABOLIC PANEL WITH GFR - Abnormal; Notable for the following components:      Result Value   Glucose, Bld 122 (*)    Creatinine, Ser 1.61 (*)    Total Protein 6.4 (*)    GFR, Estimated 42 (*)    All other components within normal limits  CBC - Abnormal; Notable for the following components:   RBC 4.01 (*)    Hemoglobin 12.1 (*)    HCT 36.9 (*)    All other  components within normal limits  CBG MONITORING, ED - Abnormal; Notable for the following components:   Glucose-Capillary 122 (*)    All other components within normal limits  URINALYSIS, ROUTINE W REFLEX MICROSCOPIC    EKG: None ED ECG REPORT   Date: 11/11/2024  Rate: 61  Rhythm: normal sinus rhythm  QRS Axis: normal  Intervals: normal  ST/T Wave abnormalities: normal  Conduction Disutrbances:none  Narrative Interpretation:  Old EKG Reviewed: unchanged  I have personally reviewed the EKG tracing and agree with the computerized printout as noted.   Radiology: CT Hip Right Wo Contrast Result Date: 11/11/2024 CLINICAL DATA:  Pain after fall. EXAM: CT OF THE RIGHT HIP WITHOUT CONTRAST TECHNIQUE: Multidetector CT imaging of the right hip was performed according to the standard protocol. Multiplanar CT image reconstructions were also generated. RADIATION DOSE REDUCTION: This exam was performed according to the departmental dose-optimization program which includes automated exposure control, adjustment of the mA and/or kV according to patient size and/or use of iterative reconstruction technique. COMPARISON:  Earlier same day radiographs. CT abdomen/pelvis dated 01/24/2024. FINDINGS: Bones/Joint/Cartilage Acute nondisplaced fracture of the greater trochanter of the right femur involving the superoposterior and lateral facets with up to 3 mm of distraction. The right femoral head is seated within the acetabulum. Mild osteoarthritis of the right hip. Pubic symphysis is anatomically aligned. Ligaments Ligaments are suboptimally evaluated by CT. Muscles and Tendons No discrete intramuscular collection. Soft tissue Subcutaneous edema and soft tissue swelling of the right lateral hip and proximal thigh. 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  Sigmoid colonic diverticulosis. IMPRESSION: 1. Acute nondisplaced mildly distracted fracture of the greater trochanter of the right femur. 2. Subcutaneous edema and soft  tissue swelling of the right lateral hip and proximal thigh. Electronically Signed   By: Harrietta Sherry M.D.   On: 11/11/2024 15:50   CT Head Wo Contrast Result Date: 11/11/2024 EXAM: CT HEAD WITHOUT CONTRAST 11/11/2024 03:00:27 PM TECHNIQUE: CT of the head was performed without the administration of intravenous contrast. Automated exposure control, iterative reconstruction, and/or weight based adjustment of the mA/kV was utilized to reduce the radiation dose to as low as reasonably achievable. COMPARISON: MRI head 10/09/2020. CLINICAL HISTORY: Polytrauma, blunt. FINDINGS: BRAIN AND VENTRICLES: No acute hemorrhage. No evidence of acute infarct. No hydrocephalus. No extra-axial collection. No mass effect or midline shift. Moderate age-related cerebral volume loss. Mild periventricular white matter disease. Moderate vascular calcifications. ORBITS: No acute abnormality. Bilateral lens replacement. SINUSES: No acute abnormality. SOFT TISSUES AND SKULL: No acute soft tissue abnormality. No skull fracture. IMPRESSION: 1. No acute intracranial abnormality. Electronically signed by: Donnice Mania MD 11/11/2024 03:38 PM EST RP Workstation: [id number]   DG Wrist Complete Right Result Date: 11/11/2024 EXAM: 3 OR MORE VIEW(S) XRAY OF THE WRIST 11/11/2024 01:09:00 PM COMPARISON: None available. CLINICAL HISTORY: fall FINDINGS: BONES AND JOINTS: No acute fracture. No malalignment. SOFT TISSUES: Vascular calcifications. IMPRESSION: 1. No acute fracture or dislocation. Electronically signed by: Morgane Naveau MD 11/11/2024 02:10 PM EST RP Workstation: [id number]   DG Hip Unilat W or Wo Pelvis 2-3 Views Right Result Date: 11/11/2024 EXAM: 2 or more VIEW(S) XRAY OF THE BILATERAL HIP 11/11/2024 01:09:00 PM COMPARISON: None available. CLINICAL HISTORY: fall FINDINGS: BONES AND JOINTS: No acute fracture. No malalignment. Mild bilateral hip degenerative changes. LUMBAR SPINE: Degenerative changes of the visualized lower lumbar  spine. SOFT TISSUES: Prostate brachytherapy seeds noted. Vascular calcifications. IMPRESSION: 1. No evidence of acute traumatic injury. Electronically signed by: Morgane Naveau MD 11/11/2024 02:10 PM EST RP Workstation: [id number]     Procedures   Medications Ordered in the ED - No data to display                                  Medical Decision Making Amount and/or Complexity of Data Reviewed Labs: ordered. Radiology: ordered.   BP (!) 143/78 (BP Location:  Left Arm)   Pulse 69   Temp 97.7 F (36.5 C) (Oral)   Resp 16   SpO2 92%   70:24 PM   84 year old male with history of hypertension, diabetes, Parkinson disease, brought here via EMS from home for evaluation of a fall.  Patient reports 2 days ago on New Year's Eve he was in the process of putting something in the pantry and he lost balance fell on the right side of his body striking his hip against the ground.  He denies hitting his head or loss of consciousness.  He also hit his right elbow from the fall.  He was on the ground for approximately 30 minutes while waiting for his neighbor to come over to help him up.  His wife was there with him.  Once he got up to get on his easy chair he has been mostly laying his chair and have not been able to walk much or bear any weight on his right hip.  When he is not moving, states pain is minimal, increasing pain with movement.  Pain is nonradiating.  No complaint of headache neck pain chest pain abdominal pain back pain or pain to his extremities.  He is up-to-date with tetanus.  He is not on any blood thinner medication.  He denies any focal numbness or focal weakness and also denies any precipitating symptoms prior to his fall.  He has been taking some pain medication at home with relief.  He denies any knee or ankle pain.  Patient normally does not use anything to help with his walking.  He does report having trouble with his balance due to his Parkinson's disease.  Exam notable for  tenderness to right hip well-maintained adequate range of motion.  No obvious deformity noted.  Small skin tear to right posterior elbow not bleeding.  Minimal tenderness to right elbow.  No other signs of injury noted.  Pain medication offered but patient declined.  -Labs ordered, independently viewed and interpreted by me.  Labs remarkable for creatinine of 1.61, similar to baseline urinalysis without signs of UTI, normal WBC -The patient was maintained on a cardiac monitor.  I personally viewed and interpreted the cardiac monitored which showed an underlying rhythm of: SR -Imaging independently viewed and interpreted by me and I agree with radiologist's interpretation.  Result remarkable for CT scan of the right hip demonstrates acute nondisplaced mildly distracted fracture of the greater trochanter of the right femur.  Subcutaneous edema and soft tissue swelling noted.  This is a closed injury -This patient presents to the ED for concern of fall, this involves an extensive number of treatment options, and is a complaint that carries with it a high risk of complications and morbidity.  The differential diagnosis includes mechanical fall, fx, dislocation, strain, sprain, contusion -Co morbidities that complicate the patient evaluation includes HTN, DM, Parkinson -Treatment includes pain medication offered but pt declined.  IVF given -Reevaluation of the patient after these medicines showed that the patient stayed the same -PCP office notes or outside notes reviewed -Discussion with specialist orthopedist specialist Dr. Celena who request medicine admission. I appreciated consultation from [facility] Dr. Pearlean who agrees to admit pt. -Escalation to admission/observation considered: pt to be admitted.       Final diagnoses:  Closed nondisplaced fracture of greater trochanter of right femur, initial encounter [facility])    ED Discharge Orders     None          Nivia Colon,  PA-C 11/11/24 1652    Neysa Caron PARAS, DO 11/11/24 1727  "

## 2024-11-11 NOTE — Progress Notes (Signed)
 Consult request received for right displaced greater trochanteric fracture. No extension into femoral neck per CT. Have discussed with the requesting MD patient's stability, pain control, and ongoing work up. I have reviewed x-rays and developed a provisional plan to follow fracture healing.  Anticipate formal consultation tomorrow.  Mobilize WBAT with PT, OT; trochanteric precautions.   Ozell Bruch, MD [facility], [facility] [phone]

## 2024-11-11 NOTE — ED Triage Notes (Signed)
 Patient BIB EMS for hip pain post fall on new years day.  Patient has not been able to ambulate.  Some shortening of right leg noted, pulse by doppler.   Extremity warm and color good.  Patient also has small abrasion on right elbow.Patient denies any other injuries.,

## 2024-11-12 DIAGNOSIS — S72114A Nondisplaced fracture of greater trochanter of right femur, initial encounter for closed fracture: Secondary | ICD-10-CM | POA: Diagnosis not present

## 2024-11-12 MED ORDER — DONEPEZIL HCL 10 MG PO TABS
5.0000 mg | ORAL_TABLET | Freq: Every day | ORAL | Status: DC
  Administered 2024-11-12 – 2024-11-14 (×3): 5 mg via ORAL
  Filled 2024-11-12 (×3): qty 1

## 2024-11-12 MED ORDER — PANTOPRAZOLE SODIUM 40 MG PO TBEC
40.0000 mg | DELAYED_RELEASE_TABLET | Freq: Every day | ORAL | Status: DC
  Administered 2024-11-12 – 2024-11-15 (×4): 40 mg via ORAL
  Filled 2024-11-12 (×4): qty 1

## 2024-11-12 NOTE — Evaluation (Signed)
 Occupational Therapy Evaluation Patient Details Name: Scott Wang MRN: [id number] DOB: [birth year] Today's Date: 11/12/2024   History of Present Illness   Scott Wang is a 84 y.o. male admitted 11/12/2023 from home via EMS complaining of hip pain after a fall New Year's Day, since has been unable to ambulate. CT right hip without contrast with acute nondisplaced mildly distracted fracture of the greater trochanter of the right femur. PMH includes Parkinson's disease, HTN, HLD, GERD.     Clinical Impressions Pt is typically mod I for mobility with DME PRN. He was active with OPPT. He gets PRN assist for LB ADL, but otherwise is independent in ADL, wife performs IADL. Pt today was mod A for bed mobility, mod A progressing to min A for transfers, and dependent on RW and dramatically increased time for in room mobility. He is max A for LB ADL, set up to mod A for UB ADL, and will benefit from skilled OT in the acute setting as well as afterwards at rehab of <3 hours daily. Pt with the potential to progress past need for rehab to [facility] and therapy team will monitor closely. Pt is very motivated. Next session introduce AE for LB ADL and continue OOB activity and work on standing balance tolerance during functional grooming at sink level.      If plan is discharge home, recommend the following:   A lot of help with walking and/or transfers;A lot of help with bathing/dressing/bathroom;Assist for transportation;Help with stairs or ramp for entrance     Functional Status Assessment   Patient has had a recent decline in their functional status and demonstrates the ability to make significant improvements in function in a reasonable and predictable amount of time.     Equipment Recommendations   BSC/3in1     Recommendations for Other Services   PT consult     Precautions/Restrictions   Precautions Precautions: Other (comment) (trochanteric hip precautions (no active abduction of  hip)) Recall of Precautions/Restrictions: Intact Restrictions Weight Bearing Restrictions Per Provider Order: Yes RLE Weight Bearing Per Provider Order: Weight bearing as tolerated Other Position/Activity Restrictions: trochanteric hip precautions (no active abduction)     Mobility Bed Mobility Overal bed mobility: Needs Assistance Bed Mobility: Supine to Sit     Supine to sit: Mod assist, HOB elevated, Used rails     General bed mobility comments: us  of pad to facilitate hip rotation, heavy assist for trunk elevation, following directions well    Transfers Overall transfer level: Needs assistance Equipment used: Rolling walker (2 wheels) Transfers: Sit to/from Stand, Bed to chair/wheelchair/BSC Sit to Stand: Mod assist, +2 safety/equipment (mod A +2 safety for initial sit<>stand, cues for safe hand placement as Pt wanted to reach up and pull on RW)     Step pivot transfers: Min assist, +2 safety/equipment (vc for sequencing and safety, min A for balance)     General transfer comment: educated Pt on safety with RW, assist for boost and cues for sequencing weight shift      Balance Overall balance assessment: Needs assistance Sitting-balance support: Feet supported Sitting balance-Leahy Scale: Fair Sitting balance - Comments: unchallenged   Standing balance support: Bilateral upper extremity supported, Reliant on assistive device for balance Standing balance-Leahy Scale: Poor Standing balance comment: dependent on RW for balance                           ADL either performed or assessed with clinical  judgement   ADL Overall ADL's : Needs assistance/impaired Eating/Feeding: Set up   Grooming: Set up;Sitting Grooming Details (indicate cue type and reason): dependent on BUE support in standing Upper Body Bathing: Contact guard assist;Sitting   Lower Body Bathing: Moderate assistance;Sitting/lateral leans Lower Body Bathing Details (indicate cue type and  reason): knees down Upper Body Dressing : Set up   Lower Body Dressing: Maximal assistance;Sit to/from stand   Toilet Transfer: Moderate assistance;Rolling walker (2 wheels) Toilet Transfer Details (indicate cue type and reason): cues for safe hand placement, assist for initial boost Toileting- Clothing Manipulation and Hygiene: Contact guard assist;Sitting/lateral lean   Tub/ Shower Transfer: Walk-in shower   Functional mobility during ADLs: Minimal assistance;+2 for safety/equipment;Cueing for sequencing;Rolling walker (2 wheels) (chair follow) General ADL Comments: decreased access to LB for ADL, increased assist for bed mobility and transfers     Vision Ability to See in Adequate Light: 0 Adequate Patient Visual Report: No change from baseline       Perception         Praxis         Pertinent Vitals/Pain Pain Assessment Pain Assessment: 0-10 Pain Score: 5  (increased to 10 with mobility) Pain Location: R hip Pain Descriptors / Indicators: Discomfort, Grimacing, Sore, Pressure Pain Intervention(s): Monitored during session, Repositioned     Extremity/Trunk Assessment Upper Extremity Assessment Upper Extremity Assessment: Overall WFL for tasks assessed   Lower Extremity Assessment Lower Extremity Assessment: Defer to PT evaluation       Communication Communication Communication: Impaired Factors Affecting Communication: Hearing impaired   Cognition Arousal: Alert Behavior During Therapy: WFL for tasks assessed/performed, Flat affect Cognition: No apparent impairments (hx of parkinsons)             OT - Cognition Comments: pleasant and conversational                 Following commands: Intact       Cueing  General Comments   Cueing Techniques: Verbal cues  wife present and supportive   Exercises     Shoulder Instructions      Home Living Family/patient expects to be discharged to:: Private residence Living Arrangements:  Spouse/significant other Available Help at Discharge: Family;Available 24 hours/day Type of Home: House Home Access: Stairs to enter [employer] of Steps: 3 Entrance Stairs-Rails: Can reach both Home Layout: One level     Bathroom Shower/Tub: Producer, Television/film/video: Handicapped height Bathroom Accessibility: Yes How Accessible: Accessible via walker Home Equipment: Rolling Walker (2 wheels);Rollator (4 wheels);Shower seat;Grab bars - tub/shower;Hand held shower head;Wheelchair - manual          Prior Functioning/Environment Prior Level of Function : Independent/Modified Independent             Mobility Comments: used RW PRN, was getting outpatient PT due to increased falls at home ADLs Comments: wife assists with socks and shoes PRN, otherwise Pt independent. Wife performs IADL    OT Problem List: Decreased range of motion;Decreased activity tolerance;Impaired balance (sitting and/or standing);Decreased safety awareness;Decreased knowledge of use of DME or AE;Pain   OT Treatment/Interventions: Self-care/ADL training;DME and/or AE instruction;Therapeutic activities;Patient/family education;Balance training      OT Goals(Current goals can be found in the care plan section)   Acute Rehab OT Goals Patient Stated Goal: get back on my feet and stop falling OT Goal Formulation: With patient/family Time For Goal Achievement: 11/26/24 Potential to Achieve Goals: Good ADL Goals Pt Will Perform Grooming: with contact guard  assist;standing Pt Will Perform Upper Body Dressing: with set-up;sitting Pt Will Perform Lower Body Dressing: with min assist;sit to/from stand;with caregiver independent in assisting Pt Will Transfer to Toilet: with supervision;ambulating Pt Will Perform Toileting - Clothing Manipulation and hygiene: with contact guard assist;sit to/from stand;sitting/lateral leans Additional ADL Goal #1: Pt will perform bed mobility at CGA while  maintaining precautions as precursor for participation in ADL   OT Frequency:  Min 2X/week    Co-evaluation PT/OT/SLP Co-Evaluation/Treatment: Yes Reason for Co-Treatment: For patient/therapist safety;To address functional/ADL transfers PT goals addressed during session: Mobility/safety with mobility;Balance;Proper use of DME OT goals addressed during session: ADL's and self-care;Proper use of Adaptive equipment and DME;Strengthening/ROM      AM-PAC OT 6 Clicks Daily Activity     Outcome Measure Help from another person eating meals?: None Help from another person taking care of personal grooming?: A Little Help from another person toileting, which includes using toliet, bedpan, or urinal?: A Lot Help from another person bathing (including washing, rinsing, drying)?: A Lot Help from another person to put on and taking off regular upper body clothing?: A Little Help from another person to put on and taking off regular lower body clothing?: A Lot 6 Click Score: 16   End of Session Equipment Utilized During Treatment: Gait belt;Rolling walker (2 wheels) Nurse Communication: Mobility status;Precautions  Activity Tolerance: Patient tolerated treatment well Patient left: in chair;with call bell/phone within reach;with chair alarm set;with family/visitor present  OT Visit Diagnosis: Unsteadiness on feet (R26.81);Other abnormalities of gait and mobility (R26.89);Muscle weakness (generalized) (M62.81);History of falling (Z91.81);Pain Pain - Right/Left: Right Pain - part of body: Hip                Time: 1134-1202 OT Time Calculation (min): 28 min Charges:  OT General Charges $OT Visit: 1 Visit OT Evaluation $OT Eval Moderate Complexity: 1 Mod  Scott Wang Acute Rehabilitation Services Office: [phone]  Scott Wang 11/12/2024, 1:12 PM

## 2024-11-12 NOTE — Evaluation (Signed)
 Physical Therapy Evaluation Patient Details Name: Scott Wang MRN: [id number] DOB: [birth year] Today's Date: 11/12/2024  History of Present Illness  Scott Wang is a 84 y.o. male admitted 11/12/2023 from home via EMS complaining of hip pain after a fall New Year's Day, since has been unable to ambulate. CT right hip without contrast with acute nondisplaced mildly distracted fracture of the greater trochanter of the right femur. PMH includes Parkinson's disease, HTN, HLD, GERD.  Clinical Impression  Pt is typically mod I for mobility with DME PRN. He was active with OPPT. He gets PRN assist for LB ADL, but otherwise is independent in ADL, wife performs IADL. Pt today was mod A for bed mobility, mod A progressing to min A for transfers, and dependent on RW and dramatically increased time for in room mobility.  Patient will benefit from continued inpatient follow up therapy, <3 hours/day to maximize IND and safety prior to return home with limited assist.  Pt is motivated and has potential to progress past need for rehab to [facility] and therapy team will monitor closely.        If plan is discharge home, recommend the following: A lot of help with walking and/or transfers;A little help with bathing/dressing/bathroom;Assistance with cooking/housework;Assist for transportation;Help with stairs or ramp for entrance   Can travel by private vehicle   No    Equipment Recommendations None recommended by PT  Recommendations for Other Services       Functional Status Assessment Patient has had a recent decline in their functional status and demonstrates the ability to make significant improvements in function in a reasonable and predictable amount of time.     Precautions / Restrictions Precautions Precautions: Other (comment) Recall of Precautions/Restrictions: Intact Restrictions Weight Bearing Restrictions Per Provider Order: Yes RLE Weight Bearing Per Provider Order: Weight bearing as tolerated Other  Position/Activity Restrictions: trochanteric hip precautions (no active abduction)      Mobility  Bed Mobility Overal bed mobility: Needs Assistance Bed Mobility: Supine to Sit     Supine to sit: Mod assist, HOB elevated, Used rails     General bed mobility comments: use of pad to facilitate hip rotation, heavy assist for trunk elevation, following directions well    Transfers Overall transfer level: Needs assistance Equipment used: Rolling walker (2 wheels) Transfers: Sit to/from Stand, Bed to chair/wheelchair/BSC Sit to Stand: Mod assist, +2 safety/equipment           General transfer comment: cues for LE management and use of UEs to self assist. Physical assist to bring wt up and fwd and to balance in standing with RW    Ambulation/Gait Ambulation/Gait assistance: Min assist, +2 safety/equipment Gait Distance (Feet): 13 Feet Assistive device: Rolling walker (2 wheels) Gait Pattern/deviations: Step-to pattern, Decreased step length - right, Decreased step length - left, Shuffle, Trunk flexed Gait velocity: decr     General Gait Details: cues for sequence, posture and position from [employer]            Wheelchair Mobility     Tilt Bed    Modified Rankin (Stroke Patients Only)       Balance Overall balance assessment: Needs assistance Sitting-balance support: Feet supported Sitting balance-Leahy Scale: Fair Sitting balance - Comments: unchallenged   Standing balance support: Bilateral upper extremity supported, Reliant on assistive device for balance Standing balance-Leahy Scale: Poor Standing balance comment: dependent on RW for balance  Pertinent Vitals/Pain Pain Assessment Pain Assessment: 0-10 Pain Score: 5  (increased to 10/10 with ambulation) Pain Location: R hip Pain Descriptors / Indicators: Discomfort, Grimacing, Sore, Pressure Pain Intervention(s): Limited activity within patient's tolerance,  Monitored during session, Premedicated before session, Ice applied    Home Living Family/patient expects to be discharged to:: Private residence Living Arrangements: Spouse/significant other Available Help at Discharge: Family;Available 24 hours/day Type of Home: House Home Access: Stairs to enter Entrance Stairs-Rails: Can reach both Entrance Stairs-Number of Steps: 3   Home Layout: One level Home Equipment: [employer] (2 wheels);Rollator (4 wheels);Shower seat;Grab bars - tub/shower;Hand held shower head;Wheelchair - manual      Prior Function Prior Level of Function : Independent/Modified Independent             Mobility Comments: used RW PRN, was getting outpatient PT due to increased falls at home ADLs Comments: wife assists with socks and shoes PRN, otherwise Pt independent. Wife performs IADL     Extremity/Trunk Assessment   Upper Extremity Assessment Upper Extremity Assessment: Overall WFL for tasks assessed    Lower Extremity Assessment Lower Extremity Assessment: Generalized weakness;RLE deficits/detail RLE: Unable to fully assess due to pain       Communication   Communication Communication: Impaired Factors Affecting Communication: Hearing impaired    Cognition Arousal: Alert Behavior During Therapy: WFL for tasks assessed/performed, Flat affect                             Following commands: Intact       Cueing Cueing Techniques: Verbal cues     General Comments General comments (skin integrity, edema, etc.): spouse present    Exercises     Assessment/Plan    PT Assessment Patient needs continued PT services  PT Problem List Decreased strength;Decreased range of motion;Decreased activity tolerance;Decreased balance;Decreased mobility;Pain;Decreased knowledge of use of DME       PT Treatment Interventions DME instruction;Gait training;Stair training;Functional mobility training;Therapeutic activities;Therapeutic  exercise;Balance training;Patient/family education    PT Goals (Current goals can be found in the Care Plan section)  Acute Rehab PT Goals Patient Stated Goal: Regain IND PT Goal Formulation: With patient Time For Goal Achievement: 11/26/24 Potential to Achieve Goals: Good    Frequency Min 5X/week     Co-evaluation PT/OT/SLP Co-Evaluation/Treatment: Yes Reason for Co-Treatment: For patient/therapist safety;To address functional/ADL transfers PT goals addressed during session: Mobility/safety with mobility;Balance;Proper use of DME OT goals addressed during session: ADL's and self-care;Proper use of Adaptive equipment and DME;Strengthening/ROM       AM-PAC PT 6 Clicks Mobility  Outcome Measure Help needed turning from your back to your side while in a flat bed without using bedrails?: A Lot Help needed moving from lying on your back to sitting on the side of a flat bed without using bedrails?: A Lot Help needed moving to and from a bed to a chair (including a wheelchair)?: A Lot Help needed standing up from a chair using your arms (e.g., wheelchair or bedside chair)?: A Lot Help needed to walk in hospital room?: A Lot Help needed climbing 3-5 steps with a railing? : A Lot 6 Click Score: 12    End of Session Equipment Utilized During Treatment: Gait belt Activity Tolerance: Patient tolerated treatment well Patient left: in chair;with call bell/phone within reach;with chair alarm set;with family/visitor present Nurse Communication: Mobility status PT Visit Diagnosis: Unsteadiness on feet (R26.81);Difficulty in walking, not elsewhere classified (R26.2);Pain Pain -  Right/Left: Right Pain - part of body: Hip    Time: 1134-1202 PT Time Calculation (min) (ACUTE ONLY): 28 min   Charges:   PT Evaluation $PT Eval Moderate Complexity: 1 Mod   PT General Charges $$ ACUTE PT VISIT: 1 Visit         [facility] PT Acute Rehabilitation Services Office  [phone]   Jalonda Antigua 11/12/2024, 1:31 PM

## 2024-11-12 NOTE — Care Management Obs Status (Signed)
 MEDICARE OBSERVATION STATUS NOTIFICATION   Patient Details  Name: Scott Wang MRN: [id number] Date of Birth: [birth year]   Medicare Observation Status Notification Given:  Yes    Sonda Manuella Quill, RN 11/12/2024, 5:11 PM

## 2024-11-12 NOTE — Consult Note (Signed)
 "             Orthopaedic Trauma Service (OTS) Consultation   Patient ID: Scott Wang MRN: [id number] DOB/AGE: [birth year] 84 y.o.   Reason for Consult: left greater troch fracture Referring Physician: Lonni Camp, PA-C; CHARLENA Carwin MD  HPI: Scott Wang is an 84 y.o. male with fall on Wed and subsequent left hip pain and difficulty ambulating. Pain is  moderately well controlled now, aching and dull, sharp and moderate with motion, without associated distal tingling or numbness, and improved with narcotics. PT and OT in progress. Wife at bedside.   Past Medical History:  Diagnosis Date   Carpal tunnel syndrome    bilateral, had shots in each one and haven't had any problems since.   Diabetes mellitus    Hyperlipidemia    Hypertension    Parkinson's disease (HCC) 2021   Prostate cancer (HCC)    Spinal stenosis     Past Surgical History:  Procedure Laterality Date   HERNIA REPAIR     HERNIA REPAIR     INGUINAL HERNIA REPAIR     KNEE SURGERY  right   KNEE SURGERY Left    PROSTATE BIOPSY     RADIOACTIVE SEED IMPLANT N/A 09/07/2018   Procedure: RADIOACTIVE SEED IMPLANT/BRACHYTHERAPY IMPLANT;  Surgeon: Nieves Cough, MD;  Location: [facility];  Service: Urology;  Laterality: N/A;   SPACE OAR INSTILLATION N/A 09/07/2018   Procedure: SPACE OAR INSTILLATION;  Surgeon: Nieves Cough, MD;  Location: [facility];  Service: Urology;  Laterality: N/A;    Family History  Problem Relation Age of Onset   Breast cancer Mother    Heart attack Father    Prostate cancer Neg Hx    Pancreatic cancer Neg Hx    Colon cancer Neg Hx    Tremor Neg Hx    Parkinson's disease Neg Hx     Social History:  reports that he quit smoking about 10 years ago. His smoking use included cigarettes. He smoked an average of 1 pack per day. He has never used smokeless tobacco. He reports that he does not drink alcohol  and does not use drugs.  Allergies:  Allergies[1]  Medications: Prior to Admission:  Medications Prior to Admission  Medication Sig Dispense Refill Last Dose/Taking   alfuzosin  (UROXATRAL ) 10 MG 24 hr tablet Take 10 mg by mouth daily with breakfast.   11/11/2024 Morning   atorvastatin  (LIPITOR) 10 MG tablet Take 10 mg by mouth at bedtime.   11/10/2024 Bedtime   botulinum toxin Type A  (BOTOX ) 100 units SOLR injection Inject 100 Units into the muscle every 3 (three) months. 1 each 0 09/06/2024   carbidopa -levodopa  (SINEMET  IR) 25-100 MG tablet Take 1 tablet by mouth 4 (four) times daily. For example, Take 1 pill sinemet  8am, noon, 4pm, then 8pm, do not take with protein (Patient taking differently: Take 1 tablet by mouth See admin instructions. Take 1 tablet by mouth at 9 AM, 1 PM, 5 PM, and 9 PM- avoid taking with protein) 360 tablet 4 11/11/2024 at  9:00 AM   carvedilol  (COREG ) 3.125 MG tablet Take 3.125 mg by mouth 2 (two) times daily with a meal.   11/11/2024 Morning   donepezil  (ARICEPT ) 5 MG tablet TAKE 1 TABLET BY MOUTH EVERYDAY AT BEDTIME (Patient taking differently: Take 5 mg by mouth at bedtime.) 90 tablet 3 11/10/2024 Bedtime   HYDROcodone -acetaminophen  (NORCO/VICODIN) 5-325 MG tablet Take 1 tablet by mouth every 6 (six) hours as  needed (for pain). 15 tablet 0 Unknown   KRILL OIL PO Take 750 mg by mouth daily with breakfast.   11/11/2024 Morning   Multiple Vitamins-Minerals (ONE-A-DAY 50 PLUS PO) Take 1 tablet by mouth daily with breakfast.   11/11/2024 Morning   pantoprazole  (PROTONIX ) 40 MG tablet Take 1 tablet (40 mg total) by mouth daily. (Patient taking differently: Take 40 mg by mouth daily before breakfast.) 30 tablet 0 11/11/2024 Morning   TYLENOL  500 MG tablet Take 500 mg by mouth 2 (two) times daily as needed for mild pain (pain score 1-3) or headache.   Unknown   Blood Glucose Monitoring Suppl (ONETOUCH VERIO FLEX SYSTEM) w/Device KIT To check blood sugars three times a day for 90 days      Cyanocobalamin  (VITAMIN B-12 PO) Take  2,500 mcg by mouth daily.   Unknown   Glucose Blood (BLOOD GLUCOSE TEST STRIPS 333) STRP To ckeck blood sugars In Vitro three times daily for 30 days      Lancets Misc. (ACCU-CHEK SOFTCLIX LANCET DEV) KIT LANCING DEVICE TO USE DAILY TO THREE TIMES DAILY TO CHECK BLOOD SUGARS FOR E11.69 30 DAYS      ONETOUCH DELICA LANCETS FINE MISC        Results for orders placed or performed during the hospital encounter of 11/11/24 (from the past 48 hours)  Comprehensive metabolic panel     Status: Abnormal   Collection Time: 11/11/24 12:28 PM  Result Value Ref Range   Sodium 137 135 - 145 mmol/L   Potassium 4.2 3.5 - 5.1 mmol/L   Chloride 104 98 - 111 mmol/L   CO2 24 22 - 32 mmol/L   Glucose, Bld 122 (H) 70 - 99 mg/dL    Comment: Glucose reference range applies only to samples taken after fasting for at least 8 hours.   BUN 22 8 - 23 mg/dL   Creatinine, Ser 8.38 (H) 0.61 - 1.24 mg/dL   Calcium  9.8 8.9 - 10.3 mg/dL   Total Protein 6.4 (L) 6.5 - 8.1 g/dL   Albumin 3.8 3.5 - 5.0 g/dL   AST 20 15 - 41 U/L    Comment: HEMOLYSIS AT THIS LEVEL MAY AFFECT RESULT   ALT <5 0 - 44 U/L   Alkaline Phosphatase 62 38 - 126 U/L   Total Bilirubin 0.9 0.0 - 1.2 mg/dL   GFR, Estimated 42 (L) >60 mL/min    Comment: (NOTE) Calculated using the CKD-EPI Creatinine Equation (2021)    Anion gap 9 5 - 15    Comment: Performed at [facility], [address] [place], [place] [address]  CBC     Status: Abnormal   Collection Time: 11/11/24 12:28 PM  Result Value Ref Range   WBC 7.1 4.0 - 10.5 K/uL   RBC 4.01 (L) 4.22 - 5.81 MIL/uL   Hemoglobin 12.1 (L) 13.0 - 17.0 g/dL   HCT 63.0 (L) 60.9 - 47.9 %   MCV 92.0 80.0 - 100.0 fL   MCH 30.2 26.0 - 34.0 pg   MCHC 32.8 30.0 - 36.0 g/dL   RDW 85.7 88.4 - 84.4 %   Platelets 178 150 - 400 K/uL   nRBC 0.0 0.0 - 0.2 %    Comment: Performed at [facility], [address] [place], [place] [address]  CBG monitoring, ED     Status:  Abnormal   Collection Time: 11/11/24 12:37 PM  Result Value Ref Range   Glucose-Capillary 122 (H) 70 - 99  mg/dL    Comment: Glucose reference range applies only to samples taken after fasting for at least 8 hours.  Urinalysis, Routine w reflex microscopic -Urine, Clean Catch     Status: None   Collection Time: 11/11/24 12:57 PM  Result Value Ref Range   Color, Urine YELLOW YELLOW   APPearance CLEAR CLEAR   Specific Gravity, Urine 1.014 1.005 - 1.030   pH 7.0 5.0 - 8.0   Glucose, UA NEGATIVE NEGATIVE mg/dL   Hgb urine dipstick NEGATIVE NEGATIVE   Bilirubin Urine NEGATIVE NEGATIVE   Ketones, ur NEGATIVE NEGATIVE mg/dL   Protein, ur NEGATIVE NEGATIVE mg/dL   Nitrite NEGATIVE NEGATIVE   Leukocytes,Ua NEGATIVE NEGATIVE    Comment: Performed at [facility], [address], [place], [place] [address]    CT Hip Right Wo Contrast Result Date: 11/11/2024 CLINICAL DATA:  Pain after fall. EXAM: CT OF THE RIGHT HIP WITHOUT CONTRAST TECHNIQUE: Multidetector CT imaging of the right hip was performed according to the standard protocol. Multiplanar CT image reconstructions were also generated. RADIATION DOSE REDUCTION: This exam was performed according to the departmental dose-optimization program which includes automated exposure control, adjustment of the mA and/or kV according to patient size and/or use of iterative reconstruction technique. COMPARISON:  Earlier same day radiographs. CT abdomen/pelvis dated 01/24/2024. FINDINGS: Bones/Joint/Cartilage Acute nondisplaced fracture of the greater trochanter of the right femur involving the superoposterior and lateral facets with up to 3 mm of distraction. The right femoral head is seated within the acetabulum. Mild osteoarthritis of the right hip. Pubic symphysis is anatomically aligned. Ligaments Ligaments are suboptimally evaluated by CT. Muscles and Tendons No discrete intramuscular collection. Soft tissue Subcutaneous edema and soft  tissue swelling of the right lateral hip and proximal thigh. 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  Sigmoid colonic diverticulosis. IMPRESSION: 1. Acute nondisplaced mildly distracted fracture of the greater trochanter of the right femur. 2. Subcutaneous edema and soft tissue swelling of the right lateral hip and proximal thigh. Electronically Signed   By: Harrietta Sherry M.D.   On: 11/11/2024 15:50   CT Head Wo Contrast Result Date: 11/11/2024 EXAM: CT HEAD WITHOUT CONTRAST 11/11/2024 03:00:27 PM TECHNIQUE: CT of the head was performed without the administration of intravenous contrast. Automated exposure control, iterative reconstruction, and/or weight based adjustment of the mA/kV was utilized to reduce the radiation dose to as low as reasonably achievable. COMPARISON: MRI head 10/09/2020. CLINICAL HISTORY: Polytrauma, blunt. FINDINGS: BRAIN AND VENTRICLES: No acute hemorrhage. No evidence of acute infarct. No hydrocephalus. No extra-axial collection. No mass effect or midline shift. Moderate age-related cerebral volume loss. Mild periventricular white matter disease. Moderate vascular calcifications. ORBITS: No acute abnormality. Bilateral lens replacement. SINUSES: No acute abnormality. SOFT TISSUES AND SKULL: No acute soft tissue abnormality. No skull fracture. IMPRESSION: 1. No acute intracranial abnormality. Electronically signed by: Donnice Mania MD 11/11/2024 03:38 PM EST RP Workstation: [id number]   DG Wrist Complete Right Result Date: 11/11/2024 EXAM: 3 OR MORE VIEW(S) XRAY OF THE WRIST 11/11/2024 01:09:00 PM COMPARISON: None available. CLINICAL HISTORY: fall FINDINGS: BONES AND JOINTS: No acute fracture. No malalignment. SOFT TISSUES: Vascular calcifications. IMPRESSION: 1. No acute fracture or dislocation. Electronically signed by: Morgane Naveau MD 11/11/2024 02:10 PM EST RP Workstation: [id number]   DG Hip Unilat W or Wo Pelvis 2-3 Views Right Result Date: 11/11/2024 EXAM: 2 or more VIEW(S) XRAY OF  THE BILATERAL HIP 11/11/2024 01:09:00 PM COMPARISON: None available. CLINICAL HISTORY: fall FINDINGS: BONES AND JOINTS: No acute fracture. No  malalignment. Mild bilateral hip degenerative changes. LUMBAR SPINE: Degenerative changes of the visualized lower lumbar spine. SOFT TISSUES: Prostate brachytherapy seeds noted. Vascular calcifications. IMPRESSION: 1. No evidence of acute traumatic injury. Electronically signed by: Morgane Naveau MD 11/11/2024 02:10 PM EST RP Workstation: [id number]    Intake/Output      01/02 0701 01/03 0700 01/03 0701 01/04 0700   P.O. 370    IV Piggyback 716    Total Intake(mL/kg) 1086 (14.2)    Urine (mL/kg/hr) 600    Stool 0    Total Output 600    Net +486         Stool Occurrence 1 x       ROS No recent fever, bleeding abnormalities, urologic dysfunction, GI problems, or weight gain.  Blood pressure 132/74, pulse (!) 59, temperature 97.8 F (36.6 C), temperature source Oral, resp. rate 16, height 5' 8 (1.727 m), weight 76.4 kg, SpO2 97%. Physical Exam NCAT, super pleasant, wife at bedside  R&LLE No pain with axial loading; Right hip pain with rotation  No Edema/ swelling  Sens: DPN, SPN, TN intact  Motor: EHL, FHL, and lessor toe ext and flex all intact grossly  DP 2+, warm to touch  Gait: could not assess Coordination and balance: could not assess   Assessment/Plan:  Right greater trochanter fracture Parkinson's PT/OT with WBAT, trochanteric hip precautions  Weightbearing: WBAT RLE Orthopedic device(s): Patient has walkers and cane Showering: yes, please VTE prophylaxis: No changes recommended (on Lovenox  in hospital) Pain control: Norco Follow - up plan: 2 weeks Contact information:  Ozell Bruch, MD, [place], PA-C   Ozell Bruch, MD [facility], [facility] [phone]  11/12/2024, 10:27 AM  [facility] [address] [place] [address] [phone] [phone] (F)    After 5pm  and on the weekends please log on to Amion, go to orthopaedics and the look under the [facility] Call for the provider(s) on call. You can also call our office at [phone] and then follow the prompts to be connected to the call team.      [1]  Allergies Allergen Reactions   Sulfa Antibiotics Hives, Swelling and Rash   "

## 2024-11-12 NOTE — Progress Notes (Signed)
 " PROGRESS NOTE    Scott Wang  FMW:[id number] DOB: [birth year] DOA: 11/11/2024 PCP: Valentin Skates, DO    Brief Narrative:   Scott Wang is a 84 y.o. male with past medical history significant for Parkinson's disease, HTN, HLD, GERD who presented to [facility] ED on 11/12/2023 from home via EMS complaining of hip pain.  Patient with fall on New Year's Day, since has been unable to ambulate.  Reports was in the pantry trying to reach up to get something when he lost his balance and fell.  No loss of consciousness.  At baseline does use walker as needed.  In the ED, temperature 97.7 F, HR 69, RR 16, BP 143/78, SpO2 92% on room air.  WBC 7.1, hemoglobin 12.1, platelet count 178.  Sodium 137, potassium 4.2, chloride 104, CO2 24, glucose 122, BUN 22, creat 1.61.  AST 20, ALT less than 5, total bilirubin 0.9.  Urinalysis unrevealing.  CT head without contrast with no acute intracranial normality.  CT right hip without contrast with acute nondisplaced mildly distracted fracture of the greater trochanter of the right femur, subcutaneous edema and soft tissue swelling of the right lateral hip and proximal thigh.  Right wrist x-ray with no acute fracture or dislocation.  Orthopedics was consulted.  TRH consulted for admission for further evaluation management of nondisplaced right greater trochanter fracture.  Assessment & Plan:   Acute nondisplaced right greater trochanter fracture Patient presenting to ED with right hip pain, inability ambulate following a fall at home on 11/10/2024.  CT right hip without contrast with acute nondisplaced mildly distracted fracture of the greater trochanter of the right femur.  Orthopedics was consulted, imaging reviewed by Dr. Celena who recommended mobilize WBAT with PT/OT with trochanteric precautions. -- Awaiting formal orthopedic evaluation/consultation -- WBAT with trochanteric precautions -- PT/OT evaluation: Pending -- Tylenol  650 g p.o. every 6 hours as  needed mild pain -- Norco 5-325 mg p.o. every 6 hours as needed moderate pain -- Fall precautions  Parkinson's disease -- Donepezil  5 g p.o. nightly -- Sinemet  IR 25-100 mg p.o. 4 times daily  HTN At baseline on carvedilol  3.125 mg p.o. twice daily, losartan  100 mg p.o. daily -- BP 132/74, HR 59 -- Hold home losartan  and carvedilol  for now -- Monitor BP  HLD -- Atorvastatin  10 mg p.o. nightly  GERD -- Protonix  40 mg p.o. daily   DVT prophylaxis: enoxaparin  (LOVENOX ) injection 40 mg Start: 11/11/24 2200    Code Status: Full Code Family Communication: Updated spouse present bedside this morning  Disposition Plan:  Level of care: Med-Surg Status is: Observation The patient remains OBS appropriate and will d/c before 2 midnights.    Consultants:  Orthopedics, Dr. Celena  Procedures:  None  Antimicrobials:  None   Subjective: Patient seen examined bedside, lying in bed.  Spouse present at bedside.  Just finished eating breakfast.  Discussed with RN.  Patient was able to sit at the bedside with minimal symptoms this morning, pain much improved.  Still awaiting orthopedic consultation as well as PT/OT evaluation.  Spouse reports already has walker/wheelchair at home, but would likely need bedside commode.  No other questions or concerns at this time.  Patient denies headache, no visual changes, no chest pain, no palpitations, no shortness of breath, no abdominal pain, no fever/chills/night sweats, no nausea/vomiting/diarrhea, no focal weakness, no fatigue, no paresthesias.  No acute events overnight per nurse staff.  Objective: Vitals:   11/11/24 1750 11/11/24 2032 11/12/24 0119  11/12/24 0527  BP:  (!) 147/71 (!) 147/67 132/74  Pulse:  77 63 (!) 59  Resp:  15 15 16   Temp:  97.8 F (36.6 C) 97.9 F (36.6 C) 97.8 F (36.6 C)  TempSrc:  Oral Oral Oral  SpO2:  98% 94% 97%  Weight: 76.4 kg     Height: 5' 8 (1.727 m)       Intake/Output Summary (Last 24 hours) at  11/12/2024 1026 Last data filed at 11/12/2024 0646 Gross per 24 hour  Intake 1085.95 ml  Output 600 ml  Net 485.95 ml   Filed Weights   11/11/24 1750  Weight: 76.4 kg    Examination:  Physical Exam: GEN: NAD, alert and oriented x 3, elderly in appearance, HEENT: NCAT, PERRL, EOMI, sclera clear, MMM PULM: CTAB w/o wheezes/crackles, normal respiratory effort CV: RRR w/o M/G/R GI: abd soft, NTND, + BS MSK: no peripheral edema, moves all extremities independently with minimal pain to right hip NEURO: Slight resting tremor, otherwise no other focal neurological deficit PSYCH: normal mood/affect Integumentary: dry/intact, no rashes or wounds    Data Reviewed: I have personally reviewed following labs and imaging studies  CBC: Recent Labs  Lab 11/11/24 1228  WBC 7.1  HGB 12.1*  HCT 36.9*  MCV 92.0  PLT 178   Basic Metabolic Panel: Recent Labs  Lab 11/11/24 1228  NA 137  K 4.2  CL 104  CO2 24  GLUCOSE 122*  BUN 22  CREATININE 1.61*  CALCIUM  9.8   GFR: Estimated Creatinine Clearance: 34.2 mL/min (A) (by C-G formula based on SCr of 1.61 mg/dL (H)). Liver Function Tests: Recent Labs  Lab 11/11/24 1228  AST 20  ALT <5  ALKPHOS 62  BILITOT 0.9  PROT 6.4*  ALBUMIN 3.8   No results for input(s): LIPASE, AMYLASE in the last 168 hours. No results for input(s): AMMONIA in the last 168 hours. Coagulation Profile: No results for input(s): INR, PROTIME in the last 168 hours. Cardiac Enzymes: No results for input(s): CKTOTAL, CKMB, CKMBINDEX, TROPONINI in the last 168 hours. BNP (last 3 results) No results for input(s): PROBNP in the last 8760 hours. HbA1C: No results for input(s): HGBA1C in the last 72 hours. CBG: Recent Labs  Lab 11/11/24 1237  GLUCAP 122*   Lipid Profile: No results for input(s): CHOL, HDL, LDLCALC, TRIG, CHOLHDL, LDLDIRECT in the last 72 hours. Thyroid Function Tests: No results for input(s): TSH,  T4TOTAL, FREET4, T3FREE, THYROIDAB in the last 72 hours. Anemia Panel: No results for input(s): VITAMINB12, FOLATE, FERRITIN, TIBC, IRON, RETICCTPCT in the last 72 hours. Sepsis Labs: No results for input(s): PROCALCITON, LATICACIDVEN in the last 168 hours.  No results found for this or any previous visit (from the past 240 hours).       Radiology Studies: CT Hip Right Wo Contrast Result Date: 11/11/2024 CLINICAL DATA:  Pain after fall. EXAM: CT OF THE RIGHT HIP WITHOUT CONTRAST TECHNIQUE: Multidetector CT imaging of the right hip was performed according to the standard protocol. Multiplanar CT image reconstructions were also generated. RADIATION DOSE REDUCTION: This exam was performed according to the departmental dose-optimization program which includes automated exposure control, adjustment of the mA and/or kV according to patient size and/or use of iterative reconstruction technique. COMPARISON:  Earlier same day radiographs. CT abdomen/pelvis dated 01/24/2024. FINDINGS: Bones/Joint/Cartilage Acute nondisplaced fracture of the greater trochanter of the right femur involving the superoposterior and lateral facets with up to 3 mm of distraction. The right femoral head is seated  within the acetabulum. Mild osteoarthritis of the right hip. Pubic symphysis is anatomically aligned. Ligaments Ligaments are suboptimally evaluated by CT. Muscles and Tendons No discrete intramuscular collection. Soft tissue Subcutaneous edema and soft tissue swelling of the right lateral hip and proximal thigh. 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  Sigmoid colonic diverticulosis. IMPRESSION: 1. Acute nondisplaced mildly distracted fracture of the greater trochanter of the right femur. 2. Subcutaneous edema and soft tissue swelling of the right lateral hip and proximal thigh. Electronically Signed   By: Harrietta Sherry M.D.   On: 11/11/2024 15:50   CT Head Wo Contrast Result Date: 11/11/2024 EXAM: CT HEAD  WITHOUT CONTRAST 11/11/2024 03:00:27 PM TECHNIQUE: CT of the head was performed without the administration of intravenous contrast. Automated exposure control, iterative reconstruction, and/or weight based adjustment of the mA/kV was utilized to reduce the radiation dose to as low as reasonably achievable. COMPARISON: MRI head 10/09/2020. CLINICAL HISTORY: Polytrauma, blunt. FINDINGS: BRAIN AND VENTRICLES: No acute hemorrhage. No evidence of acute infarct. No hydrocephalus. No extra-axial collection. No mass effect or midline shift. Moderate age-related cerebral volume loss. Mild periventricular white matter disease. Moderate vascular calcifications. ORBITS: No acute abnormality. Bilateral lens replacement. SINUSES: No acute abnormality. SOFT TISSUES AND SKULL: No acute soft tissue abnormality. No skull fracture. IMPRESSION: 1. No acute intracranial abnormality. Electronically signed by: Donnice Mania MD 11/11/2024 03:38 PM EST RP Workstation: [id number]   DG Wrist Complete Right Result Date: 11/11/2024 EXAM: 3 OR MORE VIEW(S) XRAY OF THE WRIST 11/11/2024 01:09:00 PM COMPARISON: None available. CLINICAL HISTORY: fall FINDINGS: BONES AND JOINTS: No acute fracture. No malalignment. SOFT TISSUES: Vascular calcifications. IMPRESSION: 1. No acute fracture or dislocation. Electronically signed by: Morgane Naveau MD 11/11/2024 02:10 PM EST RP Workstation: [id number]   DG Hip Unilat W or Wo Pelvis 2-3 Views Right Result Date: 11/11/2024 EXAM: 2 or more VIEW(S) XRAY OF THE BILATERAL HIP 11/11/2024 01:09:00 PM COMPARISON: None available. CLINICAL HISTORY: fall FINDINGS: BONES AND JOINTS: No acute fracture. No malalignment. Mild bilateral hip degenerative changes. LUMBAR SPINE: Degenerative changes of the visualized lower lumbar spine. SOFT TISSUES: Prostate brachytherapy seeds noted. Vascular calcifications. IMPRESSION: 1. No evidence of acute traumatic injury. Electronically signed by: Morgane Naveau MD 11/11/2024 02:10  PM EST RP Workstation: [id number]        Scheduled Meds:  atorvastatin   10 mg Oral QHS   carbidopa -levodopa   1 tablet Oral QID   donepezil   5 mg Oral QHS   enoxaparin  (LOVENOX ) injection  40 mg Subcutaneous Q24H   pantoprazole   40 mg Oral QAC breakfast   Continuous Infusions:   LOS: 0 days    Time spent: 48 minutes spent on 11/12/2024 caring for this patient face-to-face including chart review, ordering labs/tests, documenting, discussion with nursing staff, consultants, updating family and interview/physical exam    Camellia PARAS Jerauld Bostwick, DO [facility] Available via Epic secure chat 7am-7pm After these hours, please refer to coverage provider listed on [email or web address] 11/12/2024, 10:26 AM   "

## 2024-11-13 DIAGNOSIS — S72114A Nondisplaced fracture of greater trochanter of right femur, initial encounter for closed fracture: Secondary | ICD-10-CM | POA: Diagnosis not present

## 2024-11-13 MED ORDER — LOSARTAN POTASSIUM 25 MG PO TABS
25.0000 mg | ORAL_TABLET | Freq: Every day | ORAL | Status: DC
  Administered 2024-11-13 – 2024-11-14 (×2): 25 mg via ORAL
  Filled 2024-11-13 (×2): qty 1

## 2024-11-13 NOTE — Progress Notes (Signed)
"     Orthopaedic Trauma Service (OTS)  Subjective: Pain a little better. Patient and wife report that PT went very slowly yesterday.  Objective: Physical Exam RLE: No distal edema  No sensory or motor deficits. Warm with brisk CR  Assessment/Plan: Right greater trochanter fracture Parkinsons  1. Continue OT/ PT WBAT, no active hip abduction (trochanteric precautions) 2. D/C planning 3. F/u with me in two weeks  Ozell Bruch, MD [facility], [facility] [phone]  "

## 2024-11-13 NOTE — Progress Notes (Addendum)
 " PROGRESS NOTE    Scott Wang  FMW:[id number] DOB: 1941-05-31 DOA: 11/11/2024 PCP: Valentin Skates, DO    Brief Narrative:   Scott Wang is a 84 y.o. male with past medical history significant for Parkinson's disease, HTN, HLD, GERD who presented to [facility] ED on 11/12/2023 from home via EMS complaining of hip pain.  Patient with fall on New Year's Day, since has been unable to ambulate.  Reports was in the pantry trying to reach up to get something when he lost his balance and fell.  No loss of consciousness.  At baseline does use walker as needed.  In the ED, temperature 97.7 F, HR 69, RR 16, BP 143/78, SpO2 92% on room air.  WBC 7.1, hemoglobin 12.1, platelet count 178.  Sodium 137, potassium 4.2, chloride 104, CO2 24, glucose 122, BUN 22, creat 1.61.  AST 20, ALT less than 5, total bilirubin 0.9.  Urinalysis unrevealing.  CT head without contrast with no acute intracranial normality.  CT right hip without contrast with acute nondisplaced mildly distracted fracture of the greater trochanter of the right femur, subcutaneous edema and soft tissue swelling of the right lateral hip and proximal thigh.  Right wrist x-ray with no acute fracture or dislocation.  Orthopedics was consulted.  TRH consulted for admission for further evaluation management of nondisplaced right greater trochanter fracture.  Assessment & Plan:   Acute nondisplaced right greater trochanter fracture Patient presenting to ED with right hip pain, inability ambulate following a fall at home on 11/10/2024.  CT right hip without contrast with acute nondisplaced mildly distracted fracture of the greater trochanter of the right femur.  Orthopedics was consulted, reviewed by Dr. Celena who recommended nonoperative management with WBAT, trochanteric precautions. -- Orthopedics following, appreciate assistance -- WBAT with trochanteric precautions -- Tylenol  650 g p.o. every 6 hours as needed mild pain -- Norco 5-325 mg p.o.  every 6 hours as needed moderate pain -- Fall precautions -- PT/OT recommend SNF placement, TOC consulted -- Outpatient follow-up with orthopedics, Dr. Celena 2 weeks  Parkinson's disease -- Donepezil  5 g p.o. nightly -- Sinemet  IR 25-100 mg p.o. 4 times daily  HTN At baseline on carvedilol  3.125 mg p.o. twice daily, losartan  100 mg p.o. daily -- BP 132/74, HR 59 -- Hold home carvedilol  for now -- Restart home losartan  at reduced dose 25 mg p.o. daily -- Monitor BP  CKD stage IIIb Baseline creatinine 1.6-2.0, stable  HLD -- Atorvastatin  10 mg p.o. nightly  GERD -- Protonix  40 mg p.o. daily   DVT prophylaxis: enoxaparin  (LOVENOX ) injection 40 mg Start: 11/11/24 2200    Code Status: Full Code Family Communication: Updated spouse present bedside this morning  Disposition Plan:  Level of care: Med-Surg Status is: Observation The patient remains OBS appropriate and will d/c before 2 midnights.    Consultants:  Orthopedics, Dr. Celena  Procedures:  None  Antimicrobials:  None   Subjective: Patient seen examined bedside, lying in bed.  Spouse and RN present at bedside.  Seen by PT and OT yesterday with SNF recommendations.  Patient and spouse with no questions or concerns at this time.  Patient denies headache, no visual changes, no chest pain, no palpitations, no shortness of breath, no abdominal pain, no fever/chills/night sweats, no nausea/vomiting/diarrhea, no focal weakness, no fatigue, no paresthesias.  No acute events overnight per nursing staff.  Medically stable for discharge to SNF once bed available  Objective: Vitals:   11/12/24 0527 11/12/24 1407  11/12/24 2022 11/13/24 0522  BP: 132/74 (!) 145/70 (!) 159/78 (!) 152/73  Pulse: (!) 59 67 78 70  Resp: 16 18 15 16   Temp: 97.8 F (36.6 C) 97.6 F (36.4 C) 98.1 F (36.7 C) 98.1 F (36.7 C)  TempSrc: Oral  Oral Oral  SpO2: 97% 96% 92% 94%  Weight:      Height:        Intake/Output Summary (Last 24  hours) at 11/13/2024 1059 Last data filed at 11/13/2024 9374 Gross per 24 hour  Intake 270 ml  Output 1400 ml  Net -1130 ml   Filed Weights   11/11/24 1750  Weight: 76.4 kg    Examination:  Physical Exam: GEN: NAD, alert and oriented x 3, elderly in appearance, noted resting tremor HEENT: NCAT, PERRL, EOMI, sclera clear, MMM PULM: CTAB w/o wheezes/crackles, normal respiratory effort CV: RRR w/o M/G/R GI: abd soft, NTND, + BS MSK: no peripheral edema, moves all extremities independently with minimal pain to right hip NEURO: Slight resting tremor, otherwise no other focal neurological deficit PSYCH: normal mood/affect Integumentary: dry/intact, no rashes or wounds    Data Reviewed: I have personally reviewed following labs and imaging studies  CBC: Recent Labs  Lab 11/11/24 1228  WBC 7.1  HGB 12.1*  HCT 36.9*  MCV 92.0  PLT 178   Basic Metabolic Panel: Recent Labs  Lab 11/11/24 1228  NA 137  K 4.2  CL 104  CO2 24  GLUCOSE 122*  BUN 22  CREATININE 1.61*  CALCIUM  9.8   GFR: Estimated Creatinine Clearance: 34.2 mL/min (A) (by C-G formula based on SCr of 1.61 mg/dL (H)). Liver Function Tests: Recent Labs  Lab 11/11/24 1228  AST 20  ALT <5  ALKPHOS 62  BILITOT 0.9  PROT 6.4*  ALBUMIN 3.8   No results for input(s): LIPASE, AMYLASE in the last 168 hours. No results for input(s): AMMONIA in the last 168 hours. Coagulation Profile: No results for input(s): INR, PROTIME in the last 168 hours. Cardiac Enzymes: No results for input(s): CKTOTAL, CKMB, CKMBINDEX, TROPONINI in the last 168 hours. BNP (last 3 results) No results for input(s): PROBNP in the last 8760 hours. HbA1C: No results for input(s): HGBA1C in the last 72 hours. CBG: Recent Labs  Lab 11/11/24 1237  GLUCAP 122*   Lipid Profile: No results for input(s): CHOL, HDL, LDLCALC, TRIG, CHOLHDL, LDLDIRECT in the last 72 hours. Thyroid Function Tests: No results  for input(s): TSH, T4TOTAL, FREET4, T3FREE, THYROIDAB in the last 72 hours. Anemia Panel: No results for input(s): VITAMINB12, FOLATE, FERRITIN, TIBC, IRON, RETICCTPCT in the last 72 hours. Sepsis Labs: No results for input(s): PROCALCITON, LATICACIDVEN in the last 168 hours.  No results found for this or any previous visit (from the past 240 hours).       Radiology Studies: CT Hip Right Wo Contrast Result Date: 11/11/2024 CLINICAL DATA:  Pain after fall. EXAM: CT OF THE RIGHT HIP WITHOUT CONTRAST TECHNIQUE: Multidetector CT imaging of the right hip was performed according to the standard protocol. Multiplanar CT image reconstructions were also generated. RADIATION DOSE REDUCTION: This exam was performed according to the departmental dose-optimization program which includes automated exposure control, adjustment of the mA and/or kV according to patient size and/or use of iterative reconstruction technique. COMPARISON:  Earlier same day radiographs. CT abdomen/pelvis dated 01/24/2024. FINDINGS: Bones/Joint/Cartilage Acute nondisplaced fracture of the greater trochanter of the right femur involving the superoposterior and lateral facets with up to 3 mm of distraction. The  right femoral head is seated within the acetabulum. Mild osteoarthritis of the right hip. Pubic symphysis is anatomically aligned. Ligaments Ligaments are suboptimally evaluated by CT. Muscles and Tendons No discrete intramuscular collection. Soft tissue Subcutaneous edema and soft tissue swelling of the right lateral hip and proximal thigh. 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  Sigmoid colonic diverticulosis. IMPRESSION: 1. Acute nondisplaced mildly distracted fracture of the greater trochanter of the right femur. 2. Subcutaneous edema and soft tissue swelling of the right lateral hip and proximal thigh. Electronically Signed   By: Harrietta Sherry M.D.   On: 11/11/2024 15:50   CT Head Wo Contrast Result Date:  11/11/2024 EXAM: CT HEAD WITHOUT CONTRAST 11/11/2024 03:00:27 PM TECHNIQUE: CT of the head was performed without the administration of intravenous contrast. Automated exposure control, iterative reconstruction, and/or weight based adjustment of the mA/kV was utilized to reduce the radiation dose to as low as reasonably achievable. COMPARISON: MRI head 10/09/2020. CLINICAL HISTORY: Polytrauma, blunt. FINDINGS: BRAIN AND VENTRICLES: No acute hemorrhage. No evidence of acute infarct. No hydrocephalus. No extra-axial collection. No mass effect or midline shift. Moderate age-related cerebral volume loss. Mild periventricular white matter disease. Moderate vascular calcifications. ORBITS: No acute abnormality. Bilateral lens replacement. SINUSES: No acute abnormality. SOFT TISSUES AND SKULL: No acute soft tissue abnormality. No skull fracture. IMPRESSION: 1. No acute intracranial abnormality. Electronically signed by: Donnice Mania MD 11/11/2024 03:38 PM EST RP Workstation: [id number]   DG Wrist Complete Right Result Date: 11/11/2024 EXAM: 3 OR MORE VIEW(S) XRAY OF THE WRIST 11/11/2024 01:09:00 PM COMPARISON: None available. CLINICAL HISTORY: fall FINDINGS: BONES AND JOINTS: No acute fracture. No malalignment. SOFT TISSUES: Vascular calcifications. IMPRESSION: 1. No acute fracture or dislocation. Electronically signed by: Morgane Naveau MD 11/11/2024 02:10 PM EST RP Workstation: [id number]   DG Hip Unilat W or Wo Pelvis 2-3 Views Right Result Date: 11/11/2024 EXAM: 2 or more VIEW(S) XRAY OF THE BILATERAL HIP 11/11/2024 01:09:00 PM COMPARISON: None available. CLINICAL HISTORY: fall FINDINGS: BONES AND JOINTS: No acute fracture. No malalignment. Mild bilateral hip degenerative changes. LUMBAR SPINE: Degenerative changes of the visualized lower lumbar spine. SOFT TISSUES: Prostate brachytherapy seeds noted. Vascular calcifications. IMPRESSION: 1. No evidence of acute traumatic injury. Electronically signed by: Morgane  Naveau MD 11/11/2024 02:10 PM EST RP Workstation: [id number]        Scheduled Meds:  atorvastatin   10 mg Oral QHS   carbidopa -levodopa   1 tablet Oral QID   donepezil   5 mg Oral QHS   enoxaparin  (LOVENOX ) injection  40 mg Subcutaneous Q24H   pantoprazole   40 mg Oral QAC breakfast   Continuous Infusions:   LOS: 0 days    Time spent: 48 minutes spent on 11/13/2024 caring for this patient face-to-face including chart review, ordering labs/tests, documenting, discussion with nursing staff, consultants, updating family and interview/physical exam    Camellia PARAS Leelynd Maldonado, DO [facility] Available via Epic secure chat 7am-7pm After these hours, please refer to coverage provider listed on [email or web address] 11/13/2024, 10:59 AM   "

## 2024-11-13 NOTE — TOC Progression Note (Signed)
 Transition of Care [facility]) - Progression Note    Patient Details  Name: Scott Wang MRN: [id number] Date of Birth: [birth year]  [facility] of [facility] CM/SW Contact  Sonda Manuella Quill, RN Phone Number: 11/13/2024, 6:08 PM  Clinical Narrative:    IP CM consulted for SNF; spoke w/ pt and spouse Scott Wang in room; they requested IP CM follow up tomorrow; Mrs Manon said she will be in room after her MD's appt; she expects to be here by 1130; will pass on to oncoming IP CM for follow up.                     Expected Discharge Plan and Services                                               Social Drivers of Health (SDOH) Interventions SDOH Screenings   Food Insecurity: No Food Insecurity (11/11/2024)  Housing: Low Risk (11/11/2024)  Transportation Needs: No Transportation Needs (11/11/2024)  Utilities: Not At Risk (11/11/2024)  Social Connections: Moderately Integrated (11/11/2024)  Tobacco Use: Medium Risk (11/11/2024)    Readmission Risk Interventions     No data to display

## 2024-11-13 NOTE — Plan of Care (Signed)
  Problem: Clinical Measurements: Goal: Ability to maintain clinical measurements within normal limits will improve Outcome: Progressing   Problem: Nutrition: Goal: Adequate nutrition will be maintained Outcome: Progressing   Problem: Pain Managment: Goal: General experience of comfort will improve and/or be controlled Outcome: Progressing   Problem: Safety: Goal: Ability to remain free from injury will improve Outcome: Progressing

## 2024-11-14 DIAGNOSIS — S72114A Nondisplaced fracture of greater trochanter of right femur, initial encounter for closed fracture: Secondary | ICD-10-CM | POA: Diagnosis not present

## 2024-11-14 MED ORDER — LOSARTAN POTASSIUM 50 MG PO TABS
50.0000 mg | ORAL_TABLET | Freq: Every day | ORAL | Status: DC
  Administered 2024-11-15: 50 mg via ORAL
  Filled 2024-11-14: qty 1

## 2024-11-14 MED ORDER — CARVEDILOL 3.125 MG PO TABS
3.1250 mg | ORAL_TABLET | Freq: Two times a day (BID) | ORAL | Status: DC
  Administered 2024-11-14 – 2024-11-15 (×2): 3.125 mg via ORAL
  Filled 2024-11-14 (×2): qty 1

## 2024-11-14 NOTE — TOC Initial Note (Addendum)
 Transition of Care [facility]) - Initial/Assessment Note    Patient Details  Name: Scott Wang MRN: [id number] Date of Birth: 1941-05-31  Transition of Care [facility]) CM/SW Contact:    NORMAN ASPEN, LCSW Phone Number: 11/14/2024, 1:10 PM  Clinical Narrative:                  ADDENDUM 1447: SNF bed offer has been accepted at [facility] and facility can admit patient tomorrow.  Have secured insurance authorization as well.  MD/RN alerted.  Met with pt and spouse today to review therapy recommendations for SNF rehab and both agreeable.  Both very hopeful this will be short stay and pt eager to return home.  Noted he has been to [facility] before and this would be preferred.  Bed search begun.  Expected Discharge Plan: Skilled Nursing Facility Barriers to Discharge: Insurance Authorization, SNF Pending bed offer   Patient Goals and CMS Choice Patient states their goals for this hospitalization and ongoing recovery are:: return home following SNF rehab          Expected Discharge Plan and Services In-house Referral: Clinical Social Work     Living arrangements for the past 2 months: Single Family Home                 DME Arranged: N/A DME Agency: NA                  Prior Living Arrangements/Services Living arrangements for the past 2 months: Single Family Home Lives with:: Spouse Patient language and need for interpreter reviewed:: No Do you feel safe going back to the place where you live?: Yes      Need for Family Participation in Patient Care: Yes (Comment) Care giver support system in place?: Yes (comment)   Criminal Activity/Legal Involvement Pertinent to Current Situation/Hospitalization: No - Comment as needed  Activities of Daily Living   ADL Screening (condition at time of admission) Independently performs ADLs?: No Does the patient have a NEW difficulty with bathing/dressing/toileting/self-feeding that is expected to last >3 days?: Yes (Initiates electronic  notice to provider for possible OT consult) Does the patient have a NEW difficulty with getting in/out of bed, walking, or climbing stairs that is expected to last >3 days?: Yes (Initiates electronic notice to provider for possible PT consult) Does the patient have a NEW difficulty with communication that is expected to last >3 days?: No Is the patient deaf or have difficulty hearing?: Yes Does the patient have difficulty seeing, even when wearing glasses/contacts?: Yes Does the patient have difficulty concentrating, remembering, or making decisions?: No  Permission Sought/Granted Permission sought to share information with : Family Supports, Magazine Features Editor Permission granted to share information with : Yes, Verbal Permission Granted  Share Information with NAME: spouse, Erhardt Dada @ [phone]  Permission granted to share info w AGENCY: SNFs        Emotional Assessment Appearance:: Appears stated age Attitude/Demeanor/Rapport: Engaged, Gracious Affect (typically observed): Accepting Orientation: : Oriented to Self, Oriented to Place, Oriented to  Time, Oriented to Situation Alcohol  / Substance Use: Not Applicable Psych Involvement: No (comment)  Admission diagnosis:  Closed nondisplaced fracture of greater trochanter of right femur, initial encounter (HCC) [S72.114A] Nondisplaced fracture of greater trochanter of right femur, initial encounter for closed fracture [facility]) [S72.114A] Patient Active Problem List   Diagnosis Date Noted   Nondisplaced fracture of greater trochanter of right femur, initial encounter for closed fracture (HCC) 11/11/2024   AKI (acute  kidney injury) 09/10/2023   Orthostatic hypotension 11/20/2021   not drinking enough fluids 11/20/2021   Imbalance 11/20/2021   Memory loss 11/20/2021   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   Muscle stiffness due to parkinson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;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    Parkinson's disease (HCC) 12/02/2020   Malignant neoplasm of prostate (HCC) 05/05/2018   Hypertension 06/10/2011   Hyperlipidemia 06/10/2011   Diabetes mellitus (HCC) 06/10/2011   Onychomycosis 06/10/2011   Paronychia 06/10/2011   PCP:  Valentin Skates, DO Pharmacy:   [facility] [phone] [address], [place] - [address] [place] [address] Phone: [phone] Fax: [phone]     Social Drivers of Health (SDOH) Social History: SDOH Screenings   Food Insecurity: No Food Insecurity (11/11/2024)  Housing: Low Risk (11/11/2024)  Transportation Needs: No Transportation Needs (11/11/2024)  Utilities: Not At Risk (11/11/2024)  Social Connections: Moderately Integrated (11/11/2024)  Tobacco Use: Medium Risk (11/11/2024)   SDOH Interventions:     Readmission Risk Interventions     No data to display

## 2024-11-14 NOTE — Progress Notes (Signed)
 " PROGRESS NOTE    Scott Wang  FMW:[id number] DOB: 1941/02/15 DOA: 11/11/2024 PCP: Valentin Skates, DO    Brief Narrative:   Scott Wang is a 84 y.o. male with past medical history significant for Parkinson's disease, HTN, HLD, GERD who presented to [facility] ED on 11/12/2023 from home via EMS complaining of hip pain.  Patient with fall on New Year's Day, since has been unable to ambulate.  Reports was in the pantry trying to reach up to get something when he lost his balance and fell.  No loss of consciousness.  At baseline does use walker as needed.  In the ED, temperature 97.7 F, HR 69, RR 16, BP 143/78, SpO2 92% on room air.  WBC 7.1, hemoglobin 12.1, platelet count 178.  Sodium 137, potassium 4.2, chloride 104, CO2 24, glucose 122, BUN 22, creat 1.61.  AST 20, ALT less than 5, total bilirubin 0.9.  Urinalysis unrevealing.  CT head without contrast with no acute intracranial normality.  CT right hip without contrast with acute nondisplaced mildly distracted fracture of the greater trochanter of the right femur, subcutaneous edema and soft tissue swelling of the right lateral hip and proximal thigh.  Right wrist x-ray with no acute fracture or dislocation.  Orthopedics was consulted.  TRH consulted for admission for further evaluation management of nondisplaced right greater trochanter fracture.  Assessment & Plan:   Acute nondisplaced right greater trochanter fracture Patient presenting to ED with right hip pain, inability ambulate following a fall at home on 11/10/2024.  CT right hip without contrast with acute nondisplaced mildly distracted fracture of the greater trochanter of the right femur.  Orthopedics was consulted, reviewed by Dr. Celena who recommended nonoperative management with WBAT, trochanteric precautions. -- Orthopedics following, appreciate assistance -- WBAT with trochanteric precautions -- Tylenol  650 g p.o. every 6 hours as needed mild pain -- Norco 5-325 mg p.o.  every 6 hours as needed moderate pain -- Fall precautions -- PT/OT recommend SNF placement, TOC consulted -- Outpatient follow-up with orthopedics, Dr. Celena 2 weeks  Parkinson's disease -- Donepezil  5 g p.o. nightly -- Sinemet  IR 25-100 mg p.o. 4 times daily  HTN At baseline on carvedilol  3.125 mg p.o. twice daily, losartan  100 mg p.o. daily -- BP 132/74, HR 59 -- Carvedilol  3.125 m p.o. twice daily -- Losartan  50 mg p.o. daily -- Monitor BP  CKD stage IIIb Baseline creatinine 1.6-2.0, stable  HLD -- Atorvastatin  10 mg p.o. nightly  GERD -- Protonix  40 mg p.o. daily   DVT prophylaxis: enoxaparin  (LOVENOX ) injection 40 mg Start: 11/11/24 2200    Code Status: Full Code Family Communication: No family present at bedside this morning  Disposition Plan:  Level of care: Med-Surg Status is: Observation The patient remains OBS appropriate and will d/c before 2 midnights.    Consultants:  Orthopedics, Dr. Celena  Procedures:  None  Antimicrobials:  None   Subjective: Patient seen examined bedside, lying in bed.  Spouse and RN present at bedside.  Reports pain controlled with medication, received around 6 AM this morning.  PT about to work with patient.  Will need SNF placement, TOC consulted. Patient denies headache, no visual changes, no chest pain, no palpitations, no shortness of breath, no abdominal pain, no fever/chills/night sweats, no nausea/vomiting/diarrhea, no focal weakness, no fatigue, no paresthesias.  No acute events overnight per nursing staff.  Medically stable for discharge to SNF once bed available  Objective: Vitals:   11/13/24 0522 11/13/24 1350  11/13/24 2017 11/14/24 0528  BP: (!) 152/73 (!) 141/67 (!) 157/74 (!) 149/73  Pulse: 70 76 71 67  Resp: 16 16 17 17   Temp: 98.1 F (36.7 C) 98 F (36.7 C) (!) 97.5 F (36.4 C) 97.9 F (36.6 C)  TempSrc: Oral Oral Oral Oral  SpO2: 94% 96% 98% 98%  Weight:      Height:        Intake/Output Summary  (Last 24 hours) at 11/14/2024 1014 Last data filed at 11/14/2024 0930 Gross per 24 hour  Intake 1330 ml  Output 1400 ml  Net -70 ml   Filed Weights   11/11/24 1750  Weight: 76.4 kg    Examination:  Physical Exam: GEN: NAD, alert and oriented x 3, elderly in appearance, noted resting tremor HEENT: NCAT, PERRL, EOMI, sclera clear, MMM PULM: CTAB w/o wheezes/crackles, normal respiratory effort, on room air CV: RRR w/o M/G/R GI: abd soft, NTND, + BS MSK: no peripheral edema, moves all extremities independently with minimal pain to right hip NEURO: Slight resting tremor, otherwise no other focal neurological deficit PSYCH: normal mood/affect Integumentary: dry/intact, no rashes or wounds    Data Reviewed: I have personally reviewed following labs and imaging studies  CBC: Recent Labs  Lab 11/11/24 1228  WBC 7.1  HGB 12.1*  HCT 36.9*  MCV 92.0  PLT 178   Basic Metabolic Panel: Recent Labs  Lab 11/11/24 1228  NA 137  K 4.2  CL 104  CO2 24  GLUCOSE 122*  BUN 22  CREATININE 1.61*  CALCIUM  9.8   GFR: Estimated Creatinine Clearance: 34.2 mL/min (A) (by C-G formula based on SCr of 1.61 mg/dL (H)). Liver Function Tests: Recent Labs  Lab 11/11/24 1228  AST 20  ALT <5  ALKPHOS 62  BILITOT 0.9  PROT 6.4*  ALBUMIN 3.8   No results for input(s): LIPASE, AMYLASE in the last 168 hours. No results for input(s): AMMONIA in the last 168 hours. Coagulation Profile: No results for input(s): INR, PROTIME in the last 168 hours. Cardiac Enzymes: No results for input(s): CKTOTAL, CKMB, CKMBINDEX, TROPONINI in the last 168 hours. BNP (last 3 results) No results for input(s): PROBNP in the last 8760 hours. HbA1C: No results for input(s): HGBA1C in the last 72 hours. CBG: Recent Labs  Lab 11/11/24 1237  GLUCAP 122*   Lipid Profile: No results for input(s): CHOL, HDL, LDLCALC, TRIG, CHOLHDL, LDLDIRECT in the last 72 hours. Thyroid  Function Tests: No results for input(s): TSH, T4TOTAL, FREET4, T3FREE, THYROIDAB in the last 72 hours. Anemia Panel: No results for input(s): VITAMINB12, FOLATE, FERRITIN, TIBC, IRON, RETICCTPCT in the last 72 hours. Sepsis Labs: No results for input(s): PROCALCITON, LATICACIDVEN in the last 168 hours.  No results found for this or any previous visit (from the past 240 hours).       Radiology Studies: No results found.       Scheduled Meds:  atorvastatin   10 mg Oral QHS   carbidopa -levodopa   1 tablet Oral QID   donepezil   5 mg Oral QHS   enoxaparin  (LOVENOX ) injection  40 mg Subcutaneous Q24H   losartan   25 mg Oral Daily   pantoprazole   40 mg Oral QAC breakfast   Continuous Infusions:   LOS: 0 days    Time spent: 48 minutes spent on 11/14/2024 caring for this patient face-to-face including chart review, ordering labs/tests, documenting, discussion with nursing staff, consultants, updating family and interview/physical exam    Camellia PARAS Adrielle Polakowski, DO [facility] Available  via Epic secure chat 7am-7pm After these hours, please refer to coverage provider listed on [email or web address] 11/14/2024, 10:14 AM   "

## 2024-11-14 NOTE — NC FL2 (Signed)
 " [place]  MEDICAID FL2 LEVEL OF CARE FORM     IDENTIFICATION  Patient Name: Scott Wang Birthdate: 12/18/1940 Sex: male Admission Date (Current Location): 11/11/2024  [facility] and [place] Number:  Producer, Television/film/video and Address:  [facility],  [address] [place], [place] [address]      Provider Number: [id number]  Attending Physician Name and Address:  Aust[place], Eric J, DO  Relative Name and Phone Number:  wife, Elijio Staples @ [phone]    Current Level of Care: Hospital Recommended Level of Care: Skilled Nursing Facility Prior Approval Number:    Date Approved/Denied:   PASRR Number: [id number]  Discharge Plan: SNF    Current Diagnoses: Patient Active Problem List   Diagnosis Date Noted   Nondisplaced fracture of greater trochanter of right femur, initial encounter for closed fracture (HCC) 11/11/2024   AKI (acute kidney injury) 09/10/2023   Orthostatic hypotension 11/20/2021   not drinking enough fluids 11/20/2021   Imbalance 11/20/2021   Memory loss 11/20/2021   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   Muscle stiffness due to parkinson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;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   Parkinson's disease (HCC) 12/02/2020   Malignant neoplasm of prostate (HCC) 05/05/2018   Hypertension 06/10/2011   Hyperlipidemia 06/10/2011   Diabetes mellitus (HCC) 06/10/2011   Onychomycosis 06/10/2011   Paronychia 06/10/2011    Orientation RESPIRATION BLADDER Height & Weight     Self, Time, Situation, Place  Normal Continent, External catheter Weight: 168 lb 5.5 oz (76.4 kg) Height:  5' 8 (172.7 cm)  BEHAVIORAL SYMPTOMS/MOOD NEUROLOGICAL BOWEL NUTRITION STATUS      Incontinent Diet (regular)  AMBULATORY STATUS COMMUNICATION OF NEEDS Skin   Extensive Assist Verbally Normal                       Personal Care Assistance Level of Assistance  Bathing, Dressing Bathing Assistance: Limited assistance   Dressing  Assistance: Limited assistance     Functional Limitations Info  Sight, Hearing, Speech Sight Info: Adequate Hearing Info: Adequate Speech Info: Adequate    SPECIAL CARE FACTORS FREQUENCY  PT (By licensed PT), OT (By licensed OT)     PT Frequency: 5x/wk OT Frequency: 5x/wk            Contractures Contractures Info: Not present    Additional Factors Info  Code Status, Allergies Code Status Info: Full Allergies Info: Sulfa Antibiotics           Current Medications (11/14/2024):  This is the current hospital active medication list Current Facility-Administered Medications  Medication Dose Route Frequency Provider Last Rate Last Admin   acetaminophen  (TYLENOL ) tablet 650 mg  650 mg Oral Q6H PRN Emokpae, Ejiroghene E, MD       Or   acetaminophen  (TYLENOL ) suppository 650 mg  650 mg Rectal Q6H PRN Emokpae, Ejiroghene E, MD       atorvastatin  (LIPITOR) tablet 10 mg  10 mg Oral QHS Emokpae, Ejiroghene E, MD   10 mg at 11/13/24 2107   carbidopa -levodopa  (SINEMET  IR) 25-100 MG per tablet immediate release 1 tablet  1 tablet Oral QID Emokpae, Ejiroghene E, MD   1 tablet at 11/14/24 1133   carvedilol  (COREG ) tablet 3.125 mg  3.125 mg Oral BID WC [place], Eric J, DO       donepezil  (ARICEPT ) tablet 5 mg  5 mg Oral QHS [place], Eric J, DO   5 mg at 11/13/24 2106   enoxaparin  (LOVENOX )  injection 40 mg  40 mg Subcutaneous Q24H Emokpae, Ejiroghene E, MD   40 mg at 11/13/24 2106   HYDROcodone -acetaminophen  (NORCO/VICODIN) 5-325 MG per tablet 1 tablet  1 tablet Oral Q6H PRN Emokpae, Ejiroghene E, MD   1 tablet at 11/14/24 0605   [START ON 11/15/2024] losartan  (COZAAR ) tablet 50 mg  50 mg Oral Daily [place], Eric J, DO       ondansetron  (ZOFRAN ) tablet 4 mg  4 mg Oral Q6H PRN Emokpae, Ejiroghene E, MD       Or   ondansetron  (ZOFRAN ) injection 4 mg  4 mg Intravenous Q6H PRN Emokpae, Ejiroghene E, MD       pantoprazole  (PROTONIX ) EC tablet 40 mg  40 mg Oral QAC breakfast [place], Eric J, DO   40  mg at 11/14/24 0840   polyethylene glycol (MIRALAX  / GLYCOLAX ) packet 17 g  17 g Oral Daily PRN Emokpae, Ejiroghene E, MD   17 g at 11/13/24 9081     Discharge Medications: Please see discharge summary for a list of discharge medications.  Relevant Imaging Results:  Relevant Lab Results:   Additional Information    Keri Veale, LCSW     "

## 2024-11-14 NOTE — Progress Notes (Signed)
 Physical Therapy Treatment Patient Details Name: Scott Wang MRN: [id number] DOB: 1941/09/13 Today's Date: 11/14/2024   History of Present Illness Scott Wang is a 84 y.o. male admitted 11/12/2023 from home via EMS complaining of hip pain after a fall New Year's Day, since has been unable to ambulate. CT right hip without contrast with acute nondisplaced mildly distracted fracture of the greater trochanter of the right femur. PMH includes Parkinson's disease, HTN, HLD, GERD.    PT Comments   The patient is alert,  requires max assistance for mobility to sitting. +  mod/max support  of 2 to ambulate x 13' using RW, antalgic and list to right  , requires facilitation to WS  to increase step length, bilaterally. Noted festinating gait.  Patient will benefit from continued inpatient follow up therapy, <3 hours/day    If plan is discharge home, recommend the following: A lot of help with walking and/or transfers;A little help with bathing/dressing/bathroom;Assistance with cooking/housework;Assist for transportation;Help with stairs or ramp for entrance   Can travel by private vehicle     No  Equipment Recommendations  None recommended by PT    Recommendations for Other Services       Precautions / Restrictions Precautions Precautions: Fall Recall of Precautions/Restrictions: Impaired Restrictions Other Position/Activity Restrictions: trochanteric hip precautions (no active abduction)     Mobility  Bed Mobility   Bed Mobility: Supine to Sit     Supine to sit: HOB elevated, Used rails, Max assist     General bed mobility comments: multimodal cues  and use of pad to facilitate hip rotation, heavy assist for trunk elevation, following directions  with delay    Transfers Overall transfer level: Needs assistance Equipment used: Rolling walker (2 wheels) Transfers: Sit to/from Stand Sit to Stand: Mod assist, +2 safety/equipment, From elevated surface           General transfer  comment: cues for LE management and use of UEs to self assist. Physical assist to bring wt up and fwd and to balance in standing with RW, list to right    Ambulation/Gait Ambulation/Gait assistance: Mod assist, Max assist, +2 physical assistance, +2 safety/equipment Gait Distance (Feet): 13 Feet Assistive device: Rolling walker (2 wheels) Gait Pattern/deviations: Step-to pattern, Decreased step length - right, Decreased step length - left, Shuffle, Festinating Gait velocity: decr     General Gait Details: leans heavy to right, decreased WB through UE's, manual WS to  to facilitate  taking steps, remains right lateral lean   Stairs             Wheelchair Mobility     Tilt Bed    Modified Rankin (Stroke Patients Only)       Balance Overall balance assessment: History of Falls Sitting-balance support: Feet supported, Bilateral upper extremity supported Sitting balance-Leahy Scale: Fair     Standing balance support: Bilateral upper extremity supported, Reliant on assistive device for balance, During functional activity Standing balance-Leahy Scale: Poor Standing balance comment: dependent on RW for balance and   +2 mod to nbalance                            Communication Communication Communication: Impaired Factors Affecting Communication: Hearing impaired  Cognition Arousal: Alert Behavior During Therapy: WFL for tasks assessed/performed, Flat affect   PT - Cognitive impairments: Initiation, Sequencing  PT - Cognition Comments: multimodal cues to initiate activity Following commands: Intact      Cueing Cueing Techniques: Verbal cues, Tactile cues  Exercises      General Comments        Pertinent Vitals/Pain Pain Assessment Pain Assessment: Faces Pain Location: R hip Pain Descriptors / Indicators: Discomfort, Grimacing, Sore, Pressure Pain Intervention(s): Premedicated before session, Ice applied,  Repositioned, Monitored during session    Home Living                          Prior Function            PT Goals (current goals can now be found in the care plan section) Progress towards PT goals: Progressing toward goals    Frequency    Min 3X/week      PT Plan      Co-evaluation              AM-PAC PT 6 Clicks Mobility   Outcome Measure  Help needed turning from your back to your side while in a flat bed without using bedrails?: A Lot Help needed moving from lying on your back to sitting on the side of a flat bed without using bedrails?: A Lot Help needed moving to and from a bed to a chair (including a wheelchair)?: A Lot Help needed standing up from a chair using your arms (e.g., wheelchair or bedside chair)?: A Lot Help needed to walk in hospital room?: A Lot Help needed climbing 3-5 steps with a railing? : Total 6 Click Score: 11    End of Session Equipment Utilized During Treatment: Gait belt Activity Tolerance: Patient tolerated treatment well Patient left: in chair;with call bell/phone within reach;with chair alarm set Nurse Communication: Mobility status PT Visit Diagnosis: Unsteadiness on feet (R26.81);Difficulty in walking, not elsewhere classified (R26.2);Pain Pain - Right/Left: Right Pain - part of body: Hip     Time: 9154-9088 PT Time Calculation (min) (ACUTE ONLY): 26 min  Charges:    $Gait Training: 23-37 mins PT General Charges $$ ACUTE PT VISIT: 1 Visit                    Darice Potters PT Acute Rehabilitation Services Office [phone] Potters Darice Norris 11/14/2024, 9:39 AM

## 2024-11-14 NOTE — Plan of Care (Signed)
  Problem: Clinical Measurements: Goal: Will remain free from infection Outcome: Progressing   Problem: Nutrition: Goal: Adequate nutrition will be maintained Outcome: Progressing   Problem: Elimination: Goal: Will not experience complications related to bowel motility Outcome: Progressing

## 2024-11-14 NOTE — Discharge Instructions (Addendum)
"    Orthopaedic Trauma Service   Right greater trochanter fracture   Continue OT/ PT WBAT, no active hip abduction (trochanteric precautions)    "

## 2024-11-15 ENCOUNTER — Ambulatory Visit: Admitting: Physical Therapy

## 2024-11-15 DIAGNOSIS — S72114A Nondisplaced fracture of greater trochanter of right femur, initial encounter for closed fracture: Secondary | ICD-10-CM | POA: Diagnosis not present

## 2024-11-15 MED ORDER — LOSARTAN POTASSIUM 50 MG PO TABS: 50.0000 mg | ORAL_TABLET | Freq: Every day | ORAL | Status: AC

## 2024-11-15 MED ORDER — HYDROCODONE-ACETAMINOPHEN 5-325 MG PO TABS: 1.0000 | ORAL_TABLET | Freq: Four times a day (QID) | ORAL | 0 refills | Status: AC | PRN

## 2024-11-15 NOTE — Discharge Summary (Signed)
 " Physician Discharge Summary  Scott Wang FMW:[id number] DOB: 1941-06-02 DOA: 11/11/2024  PCP: Scott Skates, DO  Admit date: 11/11/2024 Discharge date: 11/15/2024  Admitted From: Home Disposition: [facility]  Recommendations for Outpatient Follow-up:  Follow up with PCP in 1-2 weeks Follow-up with orthopedics, Dr. Celena in 2 weeks   Discharge Condition: Stable CODE STATUS: Full code Diet recommendation: Heart healthy diet  History of present illness:  Scott Wang is a 84 y.o. male with past medical history significant for Parkinson's disease, HTN, HLD, GERD who presented to [facility] ED on 11/12/2023 from home via EMS complaining of hip pain.  Patient with fall on New Year's Day, since has been unable to ambulate.  Reports was in the pantry trying to reach up to get something when he lost his balance and fell.  No loss of consciousness.  At baseline does use walker as needed.   In the ED, temperature 97.7 F, HR 69, RR 16, BP 143/78, SpO2 92% on room air.  WBC 7.1, hemoglobin 12.1, platelet count 178.  Sodium 137, potassium 4.2, chloride 104, CO2 24, glucose 122, BUN 22, creat 1.61.  AST 20, ALT less than 5, total bilirubin 0.9.  Urinalysis unrevealing.  CT head without contrast with no acute intracranial normality.  CT right hip without contrast with acute nondisplaced mildly distracted fracture of the greater trochanter of the right femur, subcutaneous edema and soft tissue swelling of the right lateral hip and proximal thigh.  Right wrist x-ray with no acute fracture or dislocation.  Orthopedics was consulted.  TRH consulted for admission for further evaluation management of nondisplaced right greater trochanter fracture.  Hospital course:  Acute nondisplaced right greater trochanter fracture Patient presenting to ED with right hip pain, inability ambulate following a fall at home on 11/10/2024.  CT right hip without contrast with acute nondisplaced mildly distracted  fracture of the greater trochanter of the right femur.  Orthopedics was consulted, reviewed by Dr. Celena who recommended nonoperative management with WBAT, trochanteric precautions.  Continue pain control with Tylenol , Norco as needed.  Seen by PT and OT with recommendation of SNF placement discharging to [facility].  Outpatient follow-up with orthopedics, Dr. Celena 2 weeks.   Parkinson's disease Continue Donepezil  5 g p.o. nightly, Sinemet  IR 25-100 mg p.o. 4 times daily   HTN Carvedilol  3.125 mg p.o. twice daily, losartan  50 mg p.o. daily   CKD stage IIIb Baseline creatinine 1.6-2.0, stable   HLD Atorvastatin  10 mg p.o. nightly   GERD Protonix  40 mg p.o. daily  Discharge Diagnoses:  Principal Problem:   Nondisplaced fracture of greater trochanter of right femur, initial encounter for closed fracture [facility])    Discharge Instructions  Discharge Instructions     Call MD for:  difficulty breathing, headache or visual disturbances   Complete by: As directed    Call MD for:  extreme fatigue   Complete by: As directed    Call MD for:  persistant dizziness or light-headedness   Complete by: As directed    Call MD for:  persistant nausea and vomiting   Complete by: As directed    Call MD for:  severe uncontrolled pain   Complete by: As directed    Call MD for:  temperature >100.4   Complete by: As directed    Increase activity slowly   Complete by: As directed       Allergies as of 11/15/2024       Reactions  Sulfa Antibiotics Hives, Swelling, Rash        Medication List     TAKE these medications    carbidopa -levodopa  25-100 MG tablet Commonly known as: SINEMET  IR Take 1 tablet by mouth 4 (four) times daily. For example, Take 1 pill sinemet  8am, noon, 4pm, then 8pm, do not take with protein What changed:  when to take this additional instructions The timing of this medication is very important.   Accu-Chek Softclix Lancet Dev Kit LANCING DEVICE TO USE  DAILY TO THREE TIMES DAILY TO CHECK BLOOD SUGARS FOR E11.69 30 DAYS   alfuzosin  10 MG 24 hr tablet Commonly known as: UROXATRAL  Take 10 mg by mouth daily with breakfast.   atorvastatin  10 MG tablet Commonly known as: LIPITOR Take 10 mg by mouth at bedtime.   Blood Glucose Test Strips 333 Strp To ckeck blood sugars In Vitro three times daily for 30 days   botulinum toxin Type A  100 units Solr injection Commonly known as: BOTOX  Inject 100 Units into the muscle every 3 (three) months.   carvedilol  3.125 MG tablet Commonly known as: COREG  Take 3.125 mg by mouth 2 (two) times daily with a meal.   donepezil  5 MG tablet Commonly known as: ARICEPT  TAKE 1 TABLET BY MOUTH EVERYDAY AT BEDTIME What changed: See the new instructions.   HYDROcodone -acetaminophen  5-325 MG tablet Commonly known as: NORCO/VICODIN Take 1 tablet by mouth every 6 (six) hours as needed (for pain).   KRILL OIL PO Take 750 mg by mouth daily with breakfast.   losartan  50 MG tablet Commonly known as: COZAAR  Take 1 tablet (50 mg total) by mouth daily.   ONE-A-DAY 50 PLUS PO Take 1 tablet by mouth daily with breakfast.   OneTouch Delica Lancets Fine Misc   OneTouch Verio Flex System w/Device Kit To check blood sugars three times a day for 90 days   pantoprazole  40 MG tablet Commonly known as: PROTONIX  Take 1 tablet (40 mg total) by mouth daily. What changed: when to take this   TYLENOL  500 MG tablet Generic drug: acetaminophen  Take 500 mg by mouth 2 (two) times daily as needed for mild pain (pain score 1-3) or headache.   VITAMIN B-12 PO Take 2,500 mcg by mouth daily.        Contact information for follow-up providers     Scott Sharper, MD. Schedule an appointment as soon as possible for a visit in 2 week(s).   Specialty: Orthopedic Surgery Contact information: [address] [place] [address] [phone]         Scott Skates, DO. Schedule an appointment as soon as possible for  a visit in 1 week(s).   Specialty: Internal Medicine Contact information: [address] [place] [address] [phone]              Contact information for after-discharge care     Destination     [facility] .   Service: Skilled Nursing Contact information: [address] [place]  [address] [phone]                    Allergies[1]  Consultations: Orthopedics, Dr. Celena   Procedures/Studies: CT Hip Right Wo Contrast Result Date: 11/11/2024 CLINICAL DATA:  Pain after fall. EXAM: CT OF THE RIGHT HIP WITHOUT CONTRAST TECHNIQUE: Multidetector CT imaging of the right hip was performed according to the standard protocol. Multiplanar CT image reconstructions were also generated. RADIATION DOSE REDUCTION: This exam was performed  according to the departmental dose-optimization program which includes automated exposure control, adjustment of the mA and/or kV according to patient size and/or use of iterative reconstruction technique. COMPARISON:  Earlier same day radiographs. CT abdomen/pelvis dated 01/24/2024. FINDINGS: Bones/Joint/Cartilage Acute nondisplaced fracture of the greater trochanter of the right femur involving the superoposterior and lateral facets with up to 3 mm of distraction. The right femoral head is seated within the acetabulum. Mild osteoarthritis of the right hip. Pubic symphysis is anatomically aligned. Ligaments Ligaments are suboptimally evaluated by CT. Muscles and Tendons No discrete intramuscular collection. Soft tissue Subcutaneous edema and soft tissue swelling of the right lateral hip and proximal thigh. 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  Sigmoid colonic diverticulosis. IMPRESSION: 1. Acute nondisplaced mildly distracted fracture of the greater trochanter of the right femur. 2. Subcutaneous edema and soft tissue swelling of the right lateral hip and proximal thigh. Electronically Signed   By: Harrietta Sherry M.D.   On: 11/11/2024 15:50   CT Head Wo Contrast Result Date: 11/11/2024 EXAM: CT HEAD WITHOUT CONTRAST 11/11/2024 03:00:27 PM TECHNIQUE: CT of the head was performed without the administration of intravenous contrast. Automated exposure control, iterative reconstruction, and/or weight based adjustment of the mA/kV was utilized to reduce the radiation dose to as low as reasonably achievable. COMPARISON: MRI head 10/09/2020. CLINICAL HISTORY: Polytrauma, blunt. FINDINGS: BRAIN AND VENTRICLES: No acute hemorrhage. No evidence of acute infarct. No hydrocephalus. No extra-axial collection. No mass effect or midline shift. Moderate age-related cerebral volume loss. Mild periventricular white matter disease. Moderate vascular calcifications. ORBITS: No acute abnormality. Bilateral lens replacement. SINUSES: No acute abnormality. SOFT TISSUES AND SKULL: No acute soft tissue abnormality. No skull fracture. IMPRESSION: 1. No acute intracranial abnormality. Electronically signed by: Donnice Mania MD 11/11/2024 03:38 PM EST RP Workstation: [id number]   DG Wrist Complete Right Result Date: 11/11/2024 EXAM: 3 OR MORE VIEW(S) XRAY OF THE WRIST 11/11/2024 01:09:00 PM COMPARISON: None available. CLINICAL HISTORY: fall FINDINGS: BONES AND JOINTS: No acute fracture. No malalignment. SOFT TISSUES: Vascular calcifications. IMPRESSION: 1. No acute fracture or dislocation. Electronically signed by: Morgane Naveau MD 11/11/2024 02:10 PM EST RP Workstation: [id number]   DG Hip Unilat W or Wo Pelvis 2-3 Views Right Result Date: 11/11/2024 EXAM: 2 or more VIEW(S) XRAY OF THE BILATERAL HIP 11/11/2024 01:09:00 PM COMPARISON: None available. CLINICAL HISTORY: fall FINDINGS: BONES AND JOINTS: No acute fracture. No malalignment. Mild bilateral hip degenerative changes. LUMBAR SPINE: Degenerative changes of the visualized lower lumbar spine. SOFT TISSUES: Prostate brachytherapy seeds noted. Vascular calcifications. IMPRESSION: 1. No  evidence of acute traumatic injury. Electronically signed by: Morgane Naveau MD 11/11/2024 02:10 PM EST RP Workstation: [id number]     Subjective: Patient seen examined bedside, lying in bed.  Eating breakfast.  Spouse present.  Discharging to SNF today.  Reports pain controlled.  No other questions or concerns at this time.  Denies headache, no dizziness, no chest pain, no palpitations, no shortness of breath, abdominal pain, no fever/chills/night sweats, no nausea/vomiting/diarrhea, no focal weakness, no fatigue, no paresthesias.  No acute events overnight per nursing staff.  Discharge Exam: Vitals:   11/14/24 2141 11/15/24 0530  BP: (!) 142/71 (!) 155/76  Pulse: 69 62  Resp: 16 18  Temp: 97.8 F (36.6 C) (!) 97.5 F (36.4 C)  SpO2: 96% 98%   Vitals:   11/14/24 0528 11/14/24 1329 11/14/24 2141 11/15/24 0530  BP: (!) 149/73 130/63 (!) 142/71 (!) 155/76  Pulse: 67 72 69 62  Resp: 17 18 16  18  Temp: 97.9 F (36.6 C) 98.4 F (36.9 C) 97.8 F (36.6 C) (!) 97.5 F (36.4 C)  TempSrc: Oral Oral Oral Oral  SpO2: 98% 97% 96% 98%  Weight:      Height:        Physical Exam: GEN: NAD, alert and oriented x 3, elderly in appearance, noted resting tremor HEENT: NCAT, PERRL, EOMI, sclera clear, MMM PULM: CTAB w/o wheezes/crackles, normal respiratory effort, on room air CV: RRR w/o M/G/R GI: abd soft, NTND, + BS MSK: no peripheral edema, moves all extremities independently with minimal pain to right hip NEURO: Slight resting tremor, otherwise no other focal neurological deficit PSYCH: normal mood/affect Integumentary: dry/intact, no rashes or wounds    The results of significant diagnostics from this hospitalization (including imaging, microbiology, ancillary and laboratory) are listed below for reference.     Microbiology: No results found for this or any previous visit (from the past 240 hours).   Labs: BNP (last 3 results) No results for input(s): BNP in the last 8760  hours. Basic Metabolic Panel: Recent Labs  Lab 11/11/24 1228  NA 137  K 4.2  CL 104  CO2 24  GLUCOSE 122*  BUN 22  CREATININE 1.61*  CALCIUM  9.8   Liver Function Tests: Recent Labs  Lab 11/11/24 1228  AST 20  ALT <5  ALKPHOS 62  BILITOT 0.9  PROT 6.4*  ALBUMIN 3.8   No results for input(s): LIPASE, AMYLASE in the last 168 hours. No results for input(s): AMMONIA in the last 168 hours. CBC: Recent Labs  Lab 11/11/24 1228  WBC 7.1  HGB 12.1*  HCT 36.9*  MCV 92.0  PLT 178   Cardiac Enzymes: No results for input(s): CKTOTAL, CKMB, CKMBINDEX, TROPONINI in the last 168 hours. BNP: Invalid input(s): POCBNP CBG: Recent Labs  Lab 11/11/24 1237  GLUCAP 122*   D-Dimer No results for input(s): DDIMER in the last 72 hours. Hgb A1c No results for input(s): HGBA1C in the last 72 hours. Lipid Profile No results for input(s): CHOL, HDL, LDLCALC, TRIG, CHOLHDL, LDLDIRECT in the last 72 hours. Thyroid function studies No results for input(s): TSH, T4TOTAL, T3FREE, THYROIDAB in the last 72 hours.  Invalid input(s): FREET3 Anemia work up No results for input(s): VITAMINB12, FOLATE, FERRITIN, TIBC, IRON, RETICCTPCT in the last 72 hours. Urinalysis    Component Value Date/Time   COLORURINE YELLOW 11/11/2024 1257   APPEARANCEUR CLEAR 11/11/2024 1257   LABSPEC 1.014 11/11/2024 1257   PHURINE 7.0 11/11/2024 1257   GLUCOSEU NEGATIVE 11/11/2024 1257   HGBUR NEGATIVE 11/11/2024 1257   BILIRUBINUR NEGATIVE 11/11/2024 1257   KETONESUR NEGATIVE 11/11/2024 1257   PROTEINUR NEGATIVE 11/11/2024 1257   NITRITE NEGATIVE 11/11/2024 1257   LEUKOCYTESUR NEGATIVE 11/11/2024 1257   Sepsis Labs Recent Labs  Lab 11/11/24 1228  WBC 7.1   Microbiology No results found for this or any previous visit (from the past 240 hours).   Time coordinating discharge: Over 30 minutes  SIGNED:   Camellia PARAS Briggette Najarian, DO  [facility] 11/15/2024, 10:09 AM     [1]  Allergies Allergen Reactions   Sulfa Antibiotics Hives, Swelling and Rash   "

## 2024-11-15 NOTE — TOC Transition Note (Signed)
 Transition of Care [facility]) - Discharge Note   Patient Details  Name: Scott Wang MRN: [id number] Date of Birth: Sep 03, 1941  Transition of Care [facility] CM/SW Contact:  NORMAN ASPEN, LCSW Phone Number: 11/15/2024, 11:14 AM   Clinical Narrative:     Pt medically cleared for dc to [facility] today and have received insurance authorization.  Pt and wife aware/ agreeable.  PTAR called at 11:15am.  RN to call report to [phone]. No further IP CM needs.  Final next level of care: Skilled Nursing Facility Barriers to Discharge: Barriers Resolved   Patient Goals and CMS Choice Patient states their goals for this hospitalization and ongoing recovery are:: return home following SNF rehab          Discharge Placement              Patient chooses bed at: [facility] Patient to be transferred to facility by: PTAR Name of family member notified: spouse    Discharge Plan and Services Additional resources added to the After Visit Summary for   In-house Referral: Clinical Social Work              DME Arranged: N/A DME Agency: NA                  Social Drivers of Health (SDOH) Interventions SDOH Screenings   Food Insecurity: No Food Insecurity (11/11/2024)  Housing: Low Risk (11/11/2024)  Transportation Needs: No Transportation Needs (11/11/2024)  Utilities: Not At Risk (11/11/2024)  Social Connections: Moderately Integrated (11/11/2024)  Tobacco Use: Medium Risk (11/11/2024)     Readmission Risk Interventions     No data to display

## 2024-11-18 ENCOUNTER — Ambulatory Visit: Admitting: Physical Therapy

## 2024-11-23 ENCOUNTER — Ambulatory Visit: Admitting: Physical Therapy

## 2024-11-25 ENCOUNTER — Ambulatory Visit: Admitting: Physical Therapy

## 2024-11-29 ENCOUNTER — Telehealth: Payer: Self-pay | Admitting: Neurology

## 2024-11-29 ENCOUNTER — Ambulatory Visit: Admitting: Physical Therapy

## 2024-11-29 NOTE — Telephone Encounter (Signed)
 Pt called to Cancel appt  due to cost  Appt Canceled

## 2024-12-01 ENCOUNTER — Ambulatory Visit: Admitting: Physical Therapy

## 2024-12-06 ENCOUNTER — Ambulatory Visit: Admitting: Physical Therapy

## 2024-12-07 ENCOUNTER — Ambulatory Visit: Admitting: Neurology

## 2024-12-08 ENCOUNTER — Ambulatory Visit: Admitting: Physical Therapy

## 2024-12-14 ENCOUNTER — Ambulatory Visit: Admitting: Neurology

## 2024-12-22 ENCOUNTER — Ambulatory Visit: Admitting: Podiatry
